# Patient Record
Sex: Female | Born: 1980 | Race: White | Hispanic: No | Marital: Married | Smoking: Current some day smoker
Health system: Southern US, Community
[De-identification: ages and names within clinical notes are randomized; demographics above are authoritative.]

## PROBLEM LIST (undated history)

## (undated) DIAGNOSIS — M1990 Unspecified osteoarthritis, unspecified site: Secondary | ICD-10-CM

## (undated) DIAGNOSIS — F419 Anxiety disorder, unspecified: Secondary | ICD-10-CM

## (undated) DIAGNOSIS — K8020 Calculus of gallbladder without cholecystitis without obstruction: Secondary | ICD-10-CM

## (undated) DIAGNOSIS — F329 Major depressive disorder, single episode, unspecified: Secondary | ICD-10-CM

## (undated) DIAGNOSIS — M797 Fibromyalgia: Secondary | ICD-10-CM

## (undated) DIAGNOSIS — Z8619 Personal history of other infectious and parasitic diseases: Secondary | ICD-10-CM

## (undated) DIAGNOSIS — O24419 Gestational diabetes mellitus in pregnancy, unspecified control: Secondary | ICD-10-CM

## (undated) DIAGNOSIS — T7840XA Allergy, unspecified, initial encounter: Secondary | ICD-10-CM

## (undated) DIAGNOSIS — F32A Depression, unspecified: Secondary | ICD-10-CM

## (undated) DIAGNOSIS — G43909 Migraine, unspecified, not intractable, without status migrainosus: Secondary | ICD-10-CM

## (undated) DIAGNOSIS — F909 Attention-deficit hyperactivity disorder, unspecified type: Secondary | ICD-10-CM

## (undated) DIAGNOSIS — K219 Gastro-esophageal reflux disease without esophagitis: Secondary | ICD-10-CM

## (undated) DIAGNOSIS — IMO0002 Reserved for concepts with insufficient information to code with codable children: Secondary | ICD-10-CM

## (undated) HISTORY — DX: Migraine, unspecified, not intractable, without status migrainosus: G43.909

## (undated) HISTORY — DX: Fibromyalgia: M79.7

## (undated) HISTORY — DX: Gastro-esophageal reflux disease without esophagitis: K21.9

## (undated) HISTORY — DX: Gestational diabetes mellitus in pregnancy, unspecified control: O24.419

## (undated) HISTORY — DX: Personal history of other infectious and parasitic diseases: Z86.19

## (undated) HISTORY — DX: Major depressive disorder, single episode, unspecified: F32.9

## (undated) HISTORY — PX: KNEE CARTILAGE SURGERY: SHX688

## (undated) HISTORY — DX: Calculus of gallbladder without cholecystitis without obstruction: K80.20

## (undated) HISTORY — DX: Depression, unspecified: F32.A

## (undated) HISTORY — DX: Unspecified osteoarthritis, unspecified site: M19.90

## (undated) HISTORY — DX: Reserved for concepts with insufficient information to code with codable children: IMO0002

## (undated) HISTORY — DX: Anxiety disorder, unspecified: F41.9

## (undated) HISTORY — DX: Allergy, unspecified, initial encounter: T78.40XA

---

## 2007-02-28 ENCOUNTER — Other Ambulatory Visit: Admission: RE | Admit: 2007-02-28 | Discharge: 2007-02-28 | Payer: Self-pay | Admitting: Obstetrics and Gynecology

## 2008-11-26 LAB — CONVERTED CEMR LAB: Pap Smear: NORMAL

## 2008-12-10 ENCOUNTER — Other Ambulatory Visit: Admission: RE | Admit: 2008-12-10 | Discharge: 2008-12-10 | Payer: Self-pay | Admitting: Obstetrics and Gynecology

## 2009-01-06 ENCOUNTER — Emergency Department (HOSPITAL_COMMUNITY): Admission: EM | Admit: 2009-01-06 | Discharge: 2009-01-06 | Payer: Self-pay | Admitting: Family Medicine

## 2009-05-10 ENCOUNTER — Emergency Department (HOSPITAL_COMMUNITY): Admission: EM | Admit: 2009-05-10 | Discharge: 2009-05-10 | Payer: Self-pay | Admitting: Family Medicine

## 2009-09-19 ENCOUNTER — Emergency Department (HOSPITAL_COMMUNITY): Admission: EM | Admit: 2009-09-19 | Discharge: 2009-09-19 | Payer: Self-pay | Admitting: Family Medicine

## 2009-11-05 ENCOUNTER — Ambulatory Visit: Payer: Self-pay | Admitting: Family

## 2009-11-05 DIAGNOSIS — F419 Anxiety disorder, unspecified: Secondary | ICD-10-CM

## 2009-11-05 DIAGNOSIS — F988 Other specified behavioral and emotional disorders with onset usually occurring in childhood and adolescence: Secondary | ICD-10-CM | POA: Insufficient documentation

## 2009-11-05 DIAGNOSIS — F329 Major depressive disorder, single episode, unspecified: Secondary | ICD-10-CM

## 2009-11-05 DIAGNOSIS — F32A Depression, unspecified: Secondary | ICD-10-CM | POA: Insufficient documentation

## 2009-11-05 DIAGNOSIS — F17200 Nicotine dependence, unspecified, uncomplicated: Secondary | ICD-10-CM | POA: Insufficient documentation

## 2009-11-05 LAB — CONVERTED CEMR LAB
Alkaline Phosphatase: 49 units/L (ref 39–117)
BUN: 9 mg/dL (ref 6–23)
Basophils Absolute: 0.1 10*3/uL (ref 0.0–0.1)
Bilirubin, Direct: 0 mg/dL (ref 0.0–0.3)
CO2: 28 meq/L (ref 19–32)
Chloride: 108 meq/L (ref 96–112)
Cholesterol: 230 mg/dL — ABNORMAL HIGH (ref 0–200)
Creatinine, Ser: 0.6 mg/dL (ref 0.4–1.2)
Direct LDL: 166.7 mg/dL
Eosinophils Absolute: 0 10*3/uL (ref 0.0–0.7)
HCT: 41.6 % (ref 36.0–46.0)
HDL: 57.5 mg/dL (ref >39.00)
MCHC: 33.4 g/dL (ref 30.0–36.0)
MCV: 91.1 fL (ref 78.0–100.0)
Monocytes Absolute: 0.6 10*3/uL (ref 0.1–1.0)
Neutrophils Relative %: 82.7 % — ABNORMAL HIGH (ref 43.0–77.0)
RBC: 4.57 M/uL (ref 3.87–5.11)
Total Bilirubin: 0.4 mg/dL (ref 0.3–1.2)
Total CHOL/HDL Ratio: 4
Triglycerides: 118 mg/dL (ref 0.0–149.0)
VLDL: 23.6 mg/dL (ref 0.0–40.0)

## 2009-11-08 ENCOUNTER — Encounter: Payer: Self-pay | Admitting: Family

## 2009-11-09 ENCOUNTER — Ambulatory Visit: Payer: Self-pay | Admitting: Family

## 2009-11-15 ENCOUNTER — Ambulatory Visit: Payer: Self-pay | Admitting: Family

## 2009-11-19 ENCOUNTER — Telehealth: Payer: Self-pay | Admitting: Family

## 2009-11-22 ENCOUNTER — Ambulatory Visit: Payer: Self-pay | Admitting: Family

## 2009-12-06 ENCOUNTER — Ambulatory Visit: Payer: Self-pay | Admitting: Diagnostic Radiology

## 2009-12-06 ENCOUNTER — Ambulatory Visit: Payer: Self-pay | Admitting: Family

## 2009-12-06 ENCOUNTER — Ambulatory Visit (HOSPITAL_BASED_OUTPATIENT_CLINIC_OR_DEPARTMENT_OTHER): Admission: RE | Admit: 2009-12-06 | Discharge: 2009-12-06 | Payer: Self-pay | Admitting: Internal Medicine

## 2009-12-06 DIAGNOSIS — M542 Cervicalgia: Secondary | ICD-10-CM | POA: Insufficient documentation

## 2010-01-17 ENCOUNTER — Ambulatory Visit: Payer: Self-pay | Admitting: Family

## 2010-01-17 ENCOUNTER — Telehealth: Payer: Self-pay | Admitting: Family

## 2010-01-17 DIAGNOSIS — J329 Chronic sinusitis, unspecified: Secondary | ICD-10-CM | POA: Insufficient documentation

## 2010-01-17 DIAGNOSIS — J40 Bronchitis, not specified as acute or chronic: Secondary | ICD-10-CM | POA: Insufficient documentation

## 2010-09-28 NOTE — Assessment & Plan Note (Signed)
Summary: HIVES / TF,CMA   Vital Signs:  Patient profile:   30 year old female Weight:      190 pounds Temp:     97.5 degrees F oral Pulse rate:   80 / minute Pulse rhythm:   regular Resp:     16 per minute BP sitting:   110 / 70  (right arm) Cuff size:   regular  Vitals Entered By: Mervin Kung CMA (November 09, 2009 1:36 PM) CC: room 5  Itching on Sunday. Monday afternoon developed painful, itchy rash.   CC:  room 5  Itching on Sunday. Monday afternoon developed painful and itchy rash..  History of Present Illness: [name] [name] is a 30 year old female who presents today in follow up of her rash/"hives".  Notes itching rash.  Also noted some tongue/lip swelling last night around 6:30PM- feels like this is a continuation of rash for which she came to see me on 3/11, does not feel that this is a result of amoxicillin.  She has taking benadryl without improvement in her rash, but does note resolution of tongue and lip swelling.  Has also been applying hydrocortisone cream without relief.  Denies SOB, or wheezing.    Allergies (verified): 1)  ! Penicillin  Physical Exam  General:  Well-developed,well-nourished,in no acute distress; alert,appropriate and cooperative throughout examination Mouth:  Oral mucosa and oropharynx without lesions or exudates.  Teeth in good repair. No tongue or lip swelling Lungs:  Normal respiratory effort, chest expands symmetrically. Lungs are clear to auscultation, no crackles or wheezes. Heart:  Normal rate and regular rhythm. S1 and S2 normal without gallop, murmur, click, rub or other extra sounds. Abdomen:  raised red urticarial rash noted on back,  and arms Extremities:  fingers slightly swollen Skin:  raised urticarial rash noted on arms/back   Impression & Recommendations:  Problem # 1:  ALLERGIC REACTION (ICD-995.3) Assessment Deteriorated  Will treat with solumedrol IM x 1, followed by prednisone, will also continue benadryl and add pepcid.   Plan f/u in 1 week.  Pt advised to follow up per instructions below.  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 Plan for patient to follow up with her allergist for further testing.    Orders: Admin of Therapeutic Inj  intramuscular or subcutaneous ([address]) Solumedrol up to 125mg  (U0454)  Problem # 2:  SINUSITIS (ICD-473.9) Assessment: Comment Only I doubt that amoxicillin is playing a role,  but will switch to zithromax to be cautious Her updated medication list for this problem includes:    Zithromax Z-pak 250 Mg Tabs (Azithromycin) .Marland Kitchen... 2 tabs by mouth today, then one tablet by mouth daily x 4 more days  Complete Medication List: 1)  Effexor Xr 75 Mg Xr24h-cap (Venlafaxine hcl) .... Take 1 tablet by mouth once a day 2)  Adderall 20 Mg Tabs (Amphetamine-dextroamphetamine) .... Take 1 tablet by mouth three times a day 3)  Zyrtec Allergy 10 Mg Caps (Cetirizine hcl) .... Take 1 tablet by mouth once a day as needed. 4)  Xanax 0.5 Mg Tabs (Alprazolam) .... Take one as needed daily. 5)  Tylenol Extra Strength 500 Mg Tabs (Acetaminophen) .... As needed 6)  Excedrin Migraine 250-250-65 Mg Tabs (Aspirin-acetaminophen-caffeine) .... As needed 7)  Zithromax Z-pak 250 Mg Tabs (Azithromycin) .... 2 tabs by mouth today, then one tablet by mouth daily x 4 more days 8)  Nystatin 100000 Unit/ml Susp (Nystatin) .... One teaspoon by mouth 4x daily  for one week or until symptoms resolved 9)  Prednisone 10 Mg Tabs (Prednisone) .... Take as directed  Patient Instructions: 1)  Prednisone: take 4 tablets daily x 2 days, then 3 tabs daily x 2 days, then 2 tabs daily x 2 days, then 1 tab daily for 2 days then stop. 2)  Pepcid 20mg  by mouth two times a day  3)  Benadryl 25mg  by mouth every 6 hours. 4)  Follow up with your allergist for further evaluation of your allergies 5)  Go to ER if shortness of breath, or recurrent tongue/lip swelling.   6)  Follow  up in 1 week.   Prescriptions: PREDNISONE 10 MG TABS (PREDNISONE) take as directed  #20 x 0   Entered and Authorized by:   Lemont Fillers FNP   Signed by:   Lemont Fillers FNP on 11/09/2009   Method used:   Electronically to        [facility] (retail)       [address]       [place], [place]  [address]       Ph: [phone] or [phone]       Fax: [phone]   RxID:   [id number] ZITHROMAX Z-PAK 250 MG TABS (AZITHROMYCIN) 2 tabs by mouth today, then one tablet by mouth daily x 4 more days  #1 pack x 0   Entered and Authorized by:   Lemont Fillers FNP   Signed by:   Lemont Fillers FNP on 11/09/2009   Method used:   Electronically to        [facility] (retail)       [address]       [place], [place]  [address]       Ph: [phone] or [phone]       Fax: [phone]   RxID:   [id number]   Current Allergies (reviewed today): ! PENICILLIN   Medication Administration  Injection # 1:    Medication: Solumedrol up to 125mg     Diagnosis: ALLERGIC REACTION (ICD-995.3)    Route: IM    Site: LUOQ gluteus    Exp Date: 10/27/2010    Lot #: [id number]    Mfr: [employer]    Patient tolerated injection without complications    Given by: Mervin Kung CMA (November 09, 2009 4:11 PM)  Orders Added: 1)  Admin of Therapeutic Inj  intramuscular or subcutaneous [96[address]] 2)  Solumedrol up to 125mg  [J2930] 3)  Est. Patient Level III [[address]]

## 2010-09-28 NOTE — Assessment & Plan Note (Signed)
Summary: 1 week follow up/mhf   Vital Signs:  Patient profile:   30 year old female Weight:      186 pounds BMI:     31.55 Temp:     98.0 degrees F 0 Pulse rate:   92 / minute Pulse rhythm:   regular Resp:     16 per minute BP sitting:   120 / 70  (right arm) Cuff size:   regular  Vitals Entered By: Mervin Kung CMA (November 15, 2009 2:12 PM) CC: room 4   1 week f/u   CC:  room 4   1 week f/u.  History of Present Illness: Ms Reznik is a 30 year old female who presents today for follow up of her hives.  Notes that her hives resolved by 8PM the day she received the solumedrol.  Has appointment with allergist 3/31- She notes that she completed 4 days of prednisone and then stopped due to "wanting to jump out of my skin."  Denies wheezing, denies SOB.  Notes + pain at the base of her neck, hurts to turn head side to side, not relieved by mobic.    Allergies: 1)  ! Penicillin  Physical Exam  General:  Well-developed,well-nourished,in no acute distress; alert,appropriate and cooperative throughout examination Head:  Normocephalic and atraumatic without obvious abnormalities. No apparent alopecia or balding. Neck:  + pain at base of head with lateral movement of head left and right.  + tenderness to palpation at the base of the neck.   Skin:  skin is pink, no rashes or hives noted. Psych:  Cognition and judgment appear intact. Alert and cooperative with normal attention span and concentration. No apparent delusions, illusions, hallucinations   Impression & Recommendations:  Problem # 1:  ALLERGIC REACTION (ICD-995.3) Assessment Improved symptoms resolved, plan follow up with allergist.    Problem # 2:  NECK PAIN (ICD-723.1) Assessment: New Continue mobic, will add as needed flexeril.  I supsect that pain is musculoskeletal Her updated medication list for this problem includes:    Tylenol Extra Strength 500 Mg Tabs (Acetaminophen) .Marland Kitchen... As needed    Excedrin Migraine  250-250-65 Mg Tabs (Aspirin-acetaminophen-caffeine) .Marland Kitchen... As needed    Flexeril 5 Mg Tabs (Cyclobenzaprine hcl) ..... One tab by mouth every 8 hours as needed for neck pain  Complete Medication List: 1)  Effexor Xr 75 Mg Xr24h-cap (Venlafaxine hcl) .... Take 1 tablet by mouth once a day 2)  Adderall 20 Mg Tabs (Amphetamine-dextroamphetamine) .... Take 1 tablet by mouth three times a day 3)  Zyrtec Allergy 10 Mg Caps (Cetirizine hcl) .... Take 1 tablet by mouth once a day as needed. 4)  Xanax 0.5 Mg Tabs (Alprazolam) .... Take one as needed daily. 5)  Tylenol Extra Strength 500 Mg Tabs (Acetaminophen) .... As needed 6)  Excedrin Migraine 250-250-65 Mg Tabs (Aspirin-acetaminophen-caffeine) .... As needed 7)  Flexeril 5 Mg Tabs (Cyclobenzaprine hcl) .... One tab by mouth every 8 hours as needed for neck pain  Patient Instructions: 1)  Call if recurrent itching, if severe allergic rxn go to ER (shortness of breath, wheezing, tongue or lip swelling) 2)  Follow up with your allergist. 3)  Call if your neck pain worsens or does not improve with use of mobic and flexeril. Prescriptions: FLEXERIL 5 MG TABS (CYCLOBENZAPRINE HCL) one tab by mouth every 8 hours as needed for neck pain  #20 x 0   Entered and Authorized by:   Lemont Fillers FNP   Signed by:  Lemont Fillers FNP on 11/15/2009   Method used:   Electronically to        [facility] (retail)       [address]       [place], [place]  [address]       Ph: [phone] or [phone]       Fax: [phone]   RxID:   [id number]   Current Allergies (reviewed today): ! PENICILLIN

## 2010-09-28 NOTE — Assessment & Plan Note (Signed)
Summary: COUGH , CHEST CONGESTION/HEA--room 5   Vital Signs:  Patient profile:   30 year old female Height:      64.5 inches Weight:      190.75 pounds BMI:     32.35 O2 Sat:      98 % on Room air Temp:     98.2 degrees F oral Pulse rate:   87 / minute Pulse rhythm:   regular Resp:     16 per minute BP sitting:   120 / 80  (right arm) Cuff size:   regular  Vitals Entered By: Mervin Kung CMA (Jan 17, 2010 10:17 AM)  O2 Flow:  Room air CC: room 5  Cough, congestion x 2 days.   CC:  room 5  Cough and congestion x 2 days.Marland Kitchen  History of Present Illness: [name] [name] is a 30 year old female who presents with complaint of ear congesion, nasal congestion.  Had low grade temp last night (99.8).  + cough, productive (phlegm tastes bad).  + body aches.  Symptoms started saturday night.  Tried tylenol without improvement. Notes that her sister has been sick with 102 fever.  Patient recently was treated with doxy and clindamycin following a cellulitis on her left middle finger.  Saw wound specialist.    Allergies: 1)  ! Penicillin  Physical Exam  General:  Well-developed,well-nourished,in no acute distress; alert,appropriate and cooperative throughout examination Head:  Normocephalic and atraumatic without obvious abnormalities. No apparent alopecia or balding. + maxillary tenderness to palpation. Eyes:  PERRLA Ears:  Bilateral TM's are dull Mouth:  Oral mucosa and oropharynx without lesions or exudates.  Teeth in good repair. Neck:  mild cervical LAD Lungs:  Normal respiratory effort, chest expands symmetrically. Lungs are clear to auscultation bilaterally with exception of some upper airway rhonchi Heart:  Normal rate and regular rhythm. S1 and S2 normal without gallop, murmur, click, rub or other extra sounds. Skin:  left middle finger cellulitis is resolved.   Impression & Recommendations:  Problem # 1:  SINUSITIS (ICD-473.9) Assessment New Patient has allergy to penicillin  (lip swelling/hives) Rx given for levaquin (pt felt too expensive).  Switched to zithromax. Her updated medication list for this problem includes:    Zithromax Z-pak 250 Mg Tabs (Azithromycin) .Marland Kitchen... 2 tabs by mouth today, then one tablet by mouth daily for 4 additional days    Tussionex Pennkinetic Er 8-10 Mg/38ml Lqcr (Chlorpheniramine-hydrocodone) .Marland KitchenMarland KitchenMarland KitchenMarland Kitchen 5 ml by mouth every 12 hours as needed for severe cough  Problem # 2:  BRONCHITIS (ICD-490) Assessment: New Treat with zithromax and tussionex as needed. Her updated medication list for this problem includes:    Zithromax Z-pak 250 Mg Tabs (Azithromycin) .Marland Kitchen... 2 tabs by mouth today, then one tablet by mouth daily for 4 additional days    Tussionex Pennkinetic Er 8-10 Mg/70ml Lqcr (Chlorpheniramine-hydrocodone) .Marland KitchenMarland KitchenMarland KitchenMarland Kitchen 5 ml by mouth every 12 hours as needed for severe cough  Complete Medication List: 1)  Effexor Xr 75 Mg Xr24h-cap (Venlafaxine hcl) .... Take 1 tablet by mouth once a day 2)  Adderall 20 Mg Tabs (Amphetamine-dextroamphetamine) .... Take 1 tablet by mouth three times a day 3)  Xanax 0.5 Mg Tabs (Alprazolam) .... Take one as needed daily. 4)  Allegra 180 Mg Tabs (Fexofenadine hcl) .... Take 1 tablet by mouth once a day 5)  Imitrex 50 Mg Tabs (Sumatriptan succinate) .... One tablet by mouth as needed for migraine, may repeat in 2 hours x 1 as needed 6)  Aleve 220 Mg  Tabs (Naproxen sodium) .... As needed for headache 7)  Zithromax Z-pak 250 Mg Tabs (Azithromycin) .... 2 tabs by mouth today, then one tablet by mouth daily for 4 additional days 8)  Tussionex Pennkinetic Er 8-10 Mg/75ml Lqcr (Chlorpheniramine-hydrocodone) .... 5 ml by mouth every 12 hours as needed for severe cough  Patient Instructions: 1)  Call if you develop fever over 101, increasing sinus pressure, pain with eye movement, increased facial tenderness of swelling, or if you develop visual changes. 2)  Call if symptoms worsen or do not improve. Prescriptions: TUSSIONEX  PENNKINETIC ER 8-10 MG/5ML LQCR (CHLORPHENIRAMINE-HYDROCODONE) 5 ml by mouth every 12 hours as needed for severe cough  #150 x 0   Entered and Authorized by:   Lemont Fillers FNP   Signed by:   Lemont Fillers FNP on 01/17/2010   Method used:   Print then Give to Patient   RxID:   [id number] LEVAQUIN 500 MG TABS (LEVOFLOXACIN) one tablet by mouth daily x 7 days  #7 x 0   Entered and Authorized by:   Lemont Fillers FNP   Signed by:   Lemont Fillers FNP on 01/17/2010   Method used:   Electronically to        [facility] (retail)       [address]       [place], [place]  [address]       Ph: [phone] or [phone]       Fax: [phone]   RxID:   [id number]   Current Allergies (reviewed today): ! PENICILLIN

## 2010-09-28 NOTE — Assessment & Plan Note (Signed)
Summary: ONE MTH FU/KDC rsc with pt/mhf   Vital Signs:  Patient profile:   30 year old female Height:      64.5 inches Weight:      188 pounds BMI:     31.89 Temp:     97.9 degrees F oral Pulse rate:   84 / minute Pulse rhythm:   regular Resp:     16 per minute BP sitting:   120 / 82  (right arm) Cuff size:   regular  Vitals Entered By: Mervin Kung CMA (December 06, 2009 9:34 AM) CC: room 5  1 month follow up.  Still having headaches.   CC:  room 5  1 month follow up.  Still having headaches..  History of Present Illness: [name] [name] is a 30 year old female who presents today for follow up of her blood pressure and headache/neck pain.  She has been using Crista Elliot and [place] with some improvement.  Also notes that pain relieves when she presses her hand against the upper portion of her neck.  Pain is sharp in nature.  Has had some numbness in the right hand/arm which is intermittent in nature.  Overall her symptoms are improved.    Allergies: 1)  ! Penicillin  Physical Exam  General:  Well-developed,well-nourished,in no acute distress; alert,appropriate and cooperative throughout examination Head:  Normocephalic and atraumatic without obvious abnormalities. No apparent alopecia or balding. Neck:  + point tenderness noted on exam at top of Cspine.  Lungs:  Normal respiratory effort, chest expands symmetrically. Lungs are clear to auscultation, no crackles or wheezes. Heart:  Normal rate and regular rhythm. S1 and S2 normal without gallop, murmur, click, rub or other extra sounds. Neurologic:  upper extremity DTRs symmetrical and normal.  Bilateral upper extremity strength 5/5   Impression & Recommendations:  Problem # 1:  NECK PAIN, RIGHT (ICD-723.1) Assessment New Per history patient's main problem at this time is neck pain rather than headache.  She is experiencing some relief with naproxen.  Will refill flexeril to be used at bedtime as needed and check C-spine x-ray (urine  HCG negative) Can consider referral to orthopedics if no improvement.  Patient declines referral for PT at this time.   Overall she feels that her discomfort is improving.  The following medications were removed from the medication list:    Tylenol Extra Strength 500 Mg Tabs (Acetaminophen) .Marland Kitchen... As needed    Excedrin Migraine 250-250-65 Mg Tabs (Aspirin-acetaminophen-caffeine) .Marland Kitchen... As needed    Flexeril 5 Mg Tabs (Cyclobenzaprine hcl) ..... One tab by mouth every 8 hours as needed for neck pain Her updated medication list for this problem includes:    Aleve 220 Mg Tabs (Naproxen sodium) .Marland Kitchen... As needed for headache    Flexeril 5 Mg Tabs (Cyclobenzaprine hcl) ..... One tablet every 8 hours as needed for neck pain  Orders: T-Cervical Spine Comp w/Flex & Ext ([id number])  Problem # 2:  ELEVATED BLOOD PRESSURE WITHOUT DIAGNOSIS OF HYPERTENSION (ICD-796.2) Assessment: Improved F/u BP is normal.  Monitor.    Complete Medication List: 1)  Effexor Xr 75 Mg Xr24h-cap (Venlafaxine hcl) .... Take 1 tablet by mouth once a day 2)  Adderall 20 Mg Tabs (Amphetamine-dextroamphetamine) .... Take 1 tablet by mouth three times a day 3)  Xanax 0.5 Mg Tabs (Alprazolam) .... Take one as needed daily. 4)  Allegra 180 Mg Tabs (Fexofenadine hcl) .... Take 1 tablet by mouth once a day 5)  Imitrex 50 Mg Tabs (Sumatriptan succinate) .Marland KitchenMarland KitchenMarland Kitchen  One tablet by mouth as needed for migraine, may repeat in 2 hours x 1 as needed 6)  Aleve 220 Mg Tabs (Naproxen sodium) .... As needed for headache 7)  Flexeril 5 Mg Tabs (Cyclobenzaprine hcl) .... One tablet every 8 hours as needed for neck pain  Patient Instructions: 1)  Please complete your x-ray today. 2)  Follow up in 1 month. Prescriptions: FLEXERIL 5 MG TABS (CYCLOBENZAPRINE HCL) one tablet every 8 hours as needed for neck pain  #30 x 0   Entered and Authorized by:   Lemont Fillers FNP   Signed by:   Lemont Fillers FNP on 12/06/2009   Method used:    Electronically to        [facility] (retail)       [address]       [place], [place]  [address]       Ph: [phone] or [phone]       Fax: [phone]   RxID:   [id number]   Current Allergies (reviewed today): ! PENICILLIN  Appended Document: Orders Update    Clinical Lists Changes  Orders: Added new Service order of Urine Pregnancy Test  [phone]) - Signed Observations: Added new observation of PREG TST URN: negative (12/06/2009 16:59)      Laboratory Results   Urine Tests      Urine HCG: negative

## 2010-09-28 NOTE — Progress Notes (Signed)
Summary: generic abx alternative?  Phone Note Call from Patient Call back at [phone]   Caller: Patient Call For: Lemont Fillers FNP Summary of Call: Pt states that Levaquin is too expensive. Wants to know if there is a generic abx. that we could call in other than penicillin?  Mervin Kung CMA  Jan 17, 2010 1:12 PM   Follow-up for Phone Call        She can use Z-pak.  I sent it to her pharmacy. Pls notify patient. Follow-up by: Lemont Fillers FNP,  Jan 17, 2010 1:19 PM  Additional Follow-up for Phone Call Additional follow up Details #1::        Pt was notified of change in abx.  Mervin Kung CMA  Jan 17, 2010 1:57 PM     New/Updated Medications: ZITHROMAX Z-PAK 250 MG TABS (AZITHROMYCIN) 2 tabs by mouth today, then one tablet by mouth daily for 4 additional days Prescriptions: ZITHROMAX Z-PAK 250 MG TABS (AZITHROMYCIN) 2 tabs by mouth today, then one tablet by mouth daily for 4 additional days  #1 x 0   Entered and Authorized by:   Lemont Fillers FNP   Signed by:   Lemont Fillers FNP on 01/17/2010   Method used:   Electronically to        [facility] (retail)       [address]       [place], [place]  [address]       Ph: [phone] or [phone]       Fax: [phone]   RxID:   [id number]

## 2010-09-28 NOTE — Progress Notes (Signed)
Summary: pt. wants a call back   Phone Note Call from Patient   Caller: Patient Summary of Call: pt. needs a call back re: a question she has for [facility], call [phone] Initial call taken by: Michaelle Copas,  November 19, 2009 12:27 PM  Follow-up for Phone Call        Pt. states the pain in the back of her head is increasing. It is a sharp, shooting pain.  The tylenol and mobic doesn't seem to be helping.  Mobic eased the pain but didn't stop it. She is out of Mobic now.  States she can see a sporadic floater. Flexeril puts pt. to sleep.   Please advise.  Mervin Kung CMA  November 19, 2009 3:12 PM  Follow-up by: Lemont Fillers FNP,  November 19, 2009 3:46 PM  Additional Follow-up for Phone Call Additional follow up Details #1::        Patient notes + neck pain at base of her neck.  Has tried stretching and [employer], massage, heat.  Pain is sharp, worse with certain movements.  Pt denies photophobia or fever.   Notes some relief with 2 mobic.  Plan as needed tramadol.  If no improvement over weekend, I instructed pt to call for apt on Monday.  If fever, neck stiffness, photophobia- I advised pt to go to ED.  She verbalizes understanding. Additional Follow-up by: Lemont Fillers FNP,  November 19, 2009 3:53 PM    New/Updated Medications: TRAMADOL HCL 50 MG TABS (TRAMADOL HCL) one tablet every 6 hours as needed for severe pain Prescriptions: TRAMADOL HCL 50 MG TABS (TRAMADOL HCL) one tablet every 6 hours as needed for severe pain  #30 x 0   Entered and Authorized by:   Lemont Fillers FNP   Signed by:   Lemont Fillers FNP on 11/19/2009   Method used:   Electronically to        [facility] (retail)       [address]       [place], [place]  [address]       Ph: [phone] or [phone]       Fax: [phone]   RxID:   [id number]

## 2010-09-28 NOTE — Letter (Signed)
   [facility] nurse [facility] [address] [place], [place] [address]    November 08, 2009   [name] [name] [address] [place], [place] [address]  RE:  LAB RESULTS  Dear  Ms. Miguez,  The following is an interpretation of your most recent lab tests.  Please take note of any instructions provided or changes to medications that have resulted from your lab work.  ELECTROLYTES:  Good - no changes needed  KIDNEY FUNCTION TESTS:  Good - no changes needed  LIPID PANEL:  Fair - review at your next visit Triglyceride: 118.0   Cholesterol: 230   HDL: 57.50   Chol/HDL%:  4  Please work on a low fat low cholesterol diet.  Your cholesterol is elevated.  We can discuss further at your upcoming appointment in April.   Sincerely Yours,    Lemont Fillers FNP

## 2010-09-28 NOTE — Assessment & Plan Note (Signed)
Summary: new to be est/mhf   Vital Signs:  Patient profile:   30 year old female Height:      64.5 inches Weight:      183.50 pounds BMI:     31.12 Temp:     97.2 degrees F oral Pulse rate:   92 / minute Pulse rhythm:   regular Resp:     18 per minute BP sitting:   140 / 92  (right arm) Cuff size:   regular  Vitals Entered By: Mervin Kung CMA (November 05, 2009 8:40 AM) Is Patient Diabetic? No Comments Would also like to be checked for sinus infection. She states that she seems to keep a sinus infection.   History of Present Illness: Ms Henson is a 30 yr old female who presents to establish care.  Has not had a primary in years.  Has followed with GYN only.  Patient is fasting.  Tells me that she had breakout of hives- on Monday- was seen at [facility].  Was prescribed prednisone notes that her hives have resolved.  Had low grade fever yesterday. Patient has been followed by an allergist- last visit 2 years ago.    Preventative- Patient sees Ria Comment for GYN.  Last tetanus was greater than 10 yrs ago.  Notes that she generally works out regularly.  Notes that she has not been eating healthy- eats out frequently drinks two 2 ounce cokes daily.    ADD-  sees Dr Milagros Evener  sees regularly, notes that her ADD is well controlled.    Tobacco abuse-  has quit previously quit on chantix- not ready to quit.    Migraines- last "full blown" migraine was greater than 6 months ago, but she does get sinus headaches.    Preventive Screening-Counseling & Management  Alcohol-Tobacco     Alcohol drinks/day: <1     Alcohol type: wine & beer     Smoking Status: current     Packs/Day: <0.25  Caffeine-Diet-Exercise     Caffeine use/day: 2-- 20 0z bottles     Does Patient Exercise: no  Allergies (verified): No Known Drug Allergies  Past History:  Past Medical History: Migraines Hay Fever/Allergies Frequent Headaches  Past Surgical History: Left Knee  Surgery--1999  Family History: Prostate Cancer--Paternal grandfather Hypercholesterolemia--mother Heart Disease--maternal grandparents HTN--mother Diabetes--paternal grandmother  Mom- HTN, hyperlipidemia, hysterectomy,  migraines, asthma Dad- healthy, is a Education officer, community.  Sister-  chronic migraines, ADD No children  Social History: Married Current Smoker (5-6 cigarettes a day)  Alcohol use-yes 1-2 beers on weekends Regular exercise-no Trying to apply to a nursing program. 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  She gets this med from GYN Neuro: Denies numbness     Physical Exam  General:  overweight white female in NAD Head:  Normocephalic and atraumatic without obvious abnormalities. No apparent alopecia or balding. Eyes:  PERRLA Ears:  External ear exam shows no significant lesions or deformities.  Otoscopic examination reveals clear  canals, tympanic membranes are intact bilaterally without bulging, retraction, inflammation or discharge. Hearing is grossly normal bilaterally. Mouth:  + thrush noted on tongue Neck:  No deformities, masses, or tenderness noted. Breasts:  declined- done by GYN Lungs:  Normal respiratory effort, chest expands symmetrically. Lungs are clear to auscultation, no crackles or wheezes. Heart:  Normal rate and regular rhythm. S1 and S2 normal without gallop, murmur, click, rub or other extra sounds. Abdomen:  Bowel  sounds positive,abdomen soft and non-tender without masses, organomegaly or hernias noted. Genitalia:  deferred to GYN Msk:  No deformity or scoliosis noted of thoracic or lumbar spine.   Extremities:  No clubbing, cyanosis, edema, or deformity noted with normal full range of motion of all joints.   Neurologic:  No cranial nerve deficits noted. Station and gait are normal. Plantar reflexes are down-going bilaterally. DTRs are symmetrical throughout. Sensory, motor and coordinative functions appear intact. Skin:  ruddy complexion, but no hives or lesions.   Cervical Nodes:  No lymphadenopathy noted Psych:  Cognition and judgment appear intact. Alert and cooperative with normal attention span and concentration. No apparent delusions, illusions, hallucinations   Impression & Recommendations:  Problem # 1:  Preventive Health Care (ICD-V70.0) Assessment Comment Only Patient counselled on diet, exercise, weight loss, pap up to date,  Plan for Td today.  Check fasting labs. Orders: Venipuncture (96295) TLB-BMP (Basic Metabolic Panel-BMET) (80048-METABOL) TLB-CBC Platelet - w/Differential (85025-CBCD) TLB-Hepatic/Liver Function Pnl (80076-HEPATIC) TLB-TSH (Thyroid Stimulating Hormone) (84443-TSH) TLB-Lipid Panel (80061-LIPID)  Problem # 2:  SINUSITIS (ICD-473.9) Assessment: New Pt with chronic nasal congestion, now with yellow/bloody discharge and sinus pressure.  Will treat with amoxicillin.  Pt instructed to follow up with allergist. (especially given recent episode of hives)   Her updated medication list for this problem includes:    Amoxicillin 500 Mg Cap (Amoxicillin) .Marland Kitchen... Take 1 capsule by mouth three times a day x 10 days  Problem # 3:  CANDIDIASIS, ORAL (ICD-112.0) Assessment: New Will check fasting sugar- need to r/o DM.  Will treat with nystatin  Problem # 4:  ELEVATED BLOOD PRESSURE WITHOUT DIAGNOSIS OF HYPERTENSION (ICD-796.2) F/u BP 130/90, patient denies history of htn, plan  f/u in 1 month.  Problem # 5:  ATTENTION DEFICIT DISORDER (ICD-314.00) Assessment: New Treated by psychiatry, patient tells me that this is stable on adderral.    Problem # 6:  ANXIETY DEPRESSION (ICD-300.4) Assessment: Improved Pt notes that this is stable.  Her GYN has been prescribing effexor.  I recommended that she follow up with psychiatry for further evaluation- she may be interested in discontinuing this medication.  Problem # 7:  TOBACCO ABUSE (ICD-305.1) Assessment: Unchanged patient counseled on need to quit- tells me she is not yet ready but will let me know.   Orders: Tobacco use cessation intermediate 3-10 minutes (99406)  Complete Medication List: 1)  Effexor Xr 75 Mg Xr24h-cap (Venlafaxine hcl) .... Take 1 tablet by mouth once a day 2)  Adderall 20 Mg Tabs (Amphetamine-dextroamphetamine) .... Take 1 tablet by mouth three times a day 3)  Zyrtec Allergy 10 Mg Caps (Cetirizine hcl) .... Take 1 tablet by mouth once a day as needed. 4)  Xanax 0.5 Mg Tabs (Alprazolam) .... Take one as needed daily. 5)  Tylenol Extra Strength 500 Mg Tabs (Acetaminophen) .... As needed 6)  Excedrin Migraine 250-250-65 Mg Tabs (Aspirin-acetaminophen-caffeine) .... As needed 7)  Prednisone (pak) 10 Mg Tabs (Prednisone) .... As directed-- 6 day 8)  Amoxicillin 500 Mg Cap (Amoxicillin) .Marland KitchenMarland KitchenMarland Kitchen  Take 1 capsule by mouth three times a day x 10 days 9)  Nystatin 100000 Unit/ml Susp (Nystatin) .... One teaspoon by mouth 4x daily for one week or until symptoms resolved  Other Orders: Tdap => 25yrs IM (04540) Admin 1st Vaccine (98119)  Patient Instructions: 1)  Please work hard on diet, weight loss and healthy eating. 2)  It is important that you exercise regularly at least 20 minutes 5 times a week. If you develop chest pain, have severe difficulty breathing, or feel very tired , stop exercising immediately and seek medical attention. 3)  Call if you develop fever over 101, increasing sinus pressure, pain  with eye movement, increased facial tenderness of swelling, or if you develop visual changes. 4)  Please schedule a follow-up appointment in 1 month. Prescriptions: NYSTATIN 100000 UNIT/ML SUSP (NYSTATIN) one teaspoon by mouth 4x daily for one week or until symptoms resolved  #1 bottle x 0   Entered and Authorized by:   Lemont Fillers FNP   Signed by:   Lemont Fillers FNP on 11/05/2009   Method used:   Electronically to        [facility] (retail)       [address]       [place], [place]  [address]       Ph: [phone] or [phone]       Fax: [phone]   RxID:   [id number] AMOXICILLIN 500 MG CAP (AMOXICILLIN) Take 1 capsule by mouth three times a day X 10 days  #30 x 0   Entered and Authorized by:   Lemont Fillers FNP   Signed by:   Lemont Fillers FNP on 11/05/2009   Method used:   Electronically to        [facility] (retail)       [address]       [place], [place]  [address]       Ph: [phone] or [phone]       Fax: [phone]   RxID:   [id number]   Current Allergies (reviewed today): No known allergies    Preventive Care Screening  Pap Smear:    Date:  11/26/2008    Results:  normal     Immunization History:  Influenza Immunization History:    Influenza:  historical (06/28/2009)  Immunizations Administered:  Tetanus Vaccine:    Vaccine Type: Tdap    Site: left deltoid    Mfr: GlaxoSmithKline    Dose: 0.5 ml    Route: IM    Given by: Mervin Kung CMA    Exp. Date: 10/23/2011    Lot #: [id number]    VIS given: 07/16/07 version given November 05, 2009.    Vital Signs:  Patient Profile:   30 year old female Height:     64.5 inches Weight:      183.50 pounds BMI:     31.12 Temp:     97.2 degrees F oral Pulse rate:   92 / minute Pulse rhythm:   regular Resp:     18 per minute BP sitting:   140 / 92 Cuff size:   regular

## 2010-09-28 NOTE — Letter (Signed)
Summary: Out of Work  Adult nurse at [employer]. [address]   [place], [place] [address]   Phone: [phone]  Fax: [phone]    Jan 17, 2010   Employee:  [name] [name]    To Whom It May Concern:   For Medical reasons, please excuse the above named employee from work for the following dates:  Start:   5/23  End:   5/25  If you need additional information, please feel free to contact our office.         Sincerely,    Lemont Fillers FNP

## 2010-09-28 NOTE — Assessment & Plan Note (Signed)
Summary: headache/mhf   Vital Signs:  Patient profile:   30 year old female Height:      64.5 inches Weight:      194 pounds BMI:     32.90 Temp:     97.9 degrees F oral Pulse rate:   92 / minute Pulse rhythm:   regular Resp:     18 per minute BP sitting:   118 / 78  (right arm) Cuff size:   regular  Vitals Entered By: Mervin Kung CMA (November 22, 2009 3:42 PM) CC: room 6  Headache not improving. Now has shooting pain up head and into right eye. Is now seeing spots in her vision., Headache   CC:  room 6  Headache not improving. Now has shooting pain up head and into right eye. Is now seeing spots in her vision. and Headache.  History of Present Illness: [name] [name] is a 30 year old female who presents today with continuing complaints of headache.  Notes that this is different from the tension headaches that she has had in the past.  Pain shoots from the base of her neck to the back of her eye.  Has tried flexeril, aleve, and tylenol without improvement.  Notes + photophobia today.   Allergies: 1)  ! Penicillin  Physical Exam  General:  Well-developed,well-nourished,in no acute distress; alert,appropriate and cooperative throughout examination Neck:  No deformities, masses, some point tenderness at the right base of the skull/upper cervical region.  Full ROM of neck.   Psych:  Cognition and judgment appear intact. Alert and cooperative with normal attention span and concentration. No apparent delusions, illusions, hallucinations   Impression & Recommendations:  Problem # 1:  COMMON MIGRAINE (ICD-346.10) Assessment New Patient's symptoms have not responded to tylenol NSAIDS or flexeril.  Will give trial of imitrex for migraine.  If no improvement, will consider referral to [facility] center.  The following medications were removed from the medication list:    Tramadol Hcl 50 Mg Tabs (Tramadol hcl) ..... One tablet every 6 hours as needed for severe pain Her updated medication list  for this problem includes:    Tylenol Extra Strength 500 Mg Tabs (Acetaminophen) .Marland Kitchen... As needed    Excedrin Migraine 250-250-65 Mg Tabs (Aspirin-acetaminophen-caffeine) .Marland Kitchen... As needed    Imitrex 50 Mg Tabs (Sumatriptan succinate) ..... One tablet by mouth as needed for migraine, may repeat in 2 hours x 1 as needed  Complete Medication List: 1)  Effexor Xr 75 Mg Xr24h-cap (Venlafaxine hcl) .... Take 1 tablet by mouth once a day 2)  Adderall 20 Mg Tabs (Amphetamine-dextroamphetamine) .... Take 1 tablet by mouth three times a day 3)  Xanax 0.5 Mg Tabs (Alprazolam) .... Take one as needed daily. 4)  Tylenol Extra Strength 500 Mg Tabs (Acetaminophen) .... As needed 5)  Excedrin Migraine 250-250-65 Mg Tabs (Aspirin-acetaminophen-caffeine) .... As needed 6)  Flexeril 5 Mg Tabs (Cyclobenzaprine hcl) .... One tab by mouth every 8 hours as needed for neck pain 7)  Allegra 180 Mg Tabs (Fexofenadine hcl) .... Take 1 tablet by mouth once a day 8)  Imitrex 50 Mg Tabs (Sumatriptan succinate) .... One tablet by mouth as needed for migraine, may repeat in 2 hours x 1 as needed  Patient Instructions: 1)  Please schedule a follow-up appointment in 2 weeks. 2)  Call if symptoms worsen or do not improve. Prescriptions: IMITREX 50 MG TABS (SUMATRIPTAN SUCCINATE) one tablet by mouth as needed for migraine, may repeat in 2 hours x 1  as needed  #6 x 0   Entered and Authorized by:   Lemont Fillers FNP   Signed by:   Lemont Fillers FNP on 11/22/2009   Method used:   Electronically to        [facility] (retail)       [address]       [place], [place]  [address]       Ph: [phone] or [phone]       Fax: [phone]   RxID:   [id number]   Current Allergies (reviewed today): ! PENICILLIN

## 2010-11-17 LAB — HEPATITIS B SURFACE ANTIGEN: Hepatitis B Surface Ag: NEGATIVE

## 2010-11-17 LAB — ABO/RH

## 2010-11-17 LAB — ANTIBODY SCREEN: Antibody Screen: NEGATIVE

## 2010-11-17 LAB — STREP B DNA PROBE: GBS: POSITIVE

## 2010-11-17 LAB — RUBELLA ANTIBODY, IGM: Rubella: IMMUNE

## 2010-11-25 LAB — HIV ANTIBODY (ROUTINE TESTING W REFLEX): HIV: NONREACTIVE

## 2010-11-25 LAB — GC/CHLAMYDIA PROBE AMP, GENITAL: Gonorrhea: NEGATIVE

## 2010-12-06 LAB — POCT I-STAT, CHEM 8
Calcium, Ion: 1.17 mmol/L (ref 1.12–1.32)
Creatinine, Ser: 0.7 mg/dL (ref 0.4–1.2)
Glucose, Bld: 88 mg/dL (ref 70–99)
HCT: 40 % (ref 36.0–46.0)
Hemoglobin: 13.6 g/dL (ref 12.0–15.0)
Potassium: 3.8 mEq/L (ref 3.5–5.1)
TCO2: 26 mmol/L (ref 0–100)

## 2011-04-26 ENCOUNTER — Encounter: Payer: BC Managed Care – PPO | Attending: Obstetrics and Gynecology

## 2011-04-26 DIAGNOSIS — O99810 Abnormal glucose complicating pregnancy: Secondary | ICD-10-CM | POA: Insufficient documentation

## 2011-04-26 DIAGNOSIS — Z713 Dietary counseling and surveillance: Secondary | ICD-10-CM | POA: Insufficient documentation

## 2011-05-14 NOTE — Progress Notes (Signed)
  Patient was seen on 04/26/2011 for Gestational Diabetes self-management class at the [facility]. The following learning objectives were met by the patient during this course:   States the definition of Gestational Diabetes  States why dietary management is important in controlling blood glucose  Describes the effects each nutrient has on blood glucose levels  Demonstrates ability to create a balanced meal plan  Demonstrates carbohydrate counting   States when to check blood glucose levels  Demonstrates proper blood glucose monitoring techniques  States the effect of stress and exercise on blood glucose levels  States the importance of limiting caffeine and abstaining from alcohol and smoking  Blood glucose monitor given: Accu-Chek SmartView Kit [employer]) Lot # [id number] Exp: 07/17/2012 Blood glucose reading: WNL  Patient instructed to monitor glucose levels: Fasting and 2 hours after first bite each meal. FBS: 60 - <90 1 hour: <140 2 hour: <120  *Patient received handouts:  Nutrition Diabetes and Pregnancy  Carbohydrate Counting List  Patient will be seen for follow-up as needed.

## 2011-06-07 ENCOUNTER — Telehealth (HOSPITAL_COMMUNITY): Payer: Self-pay | Admitting: *Deleted

## 2011-06-07 ENCOUNTER — Encounter (HOSPITAL_COMMUNITY): Payer: Self-pay | Admitting: *Deleted

## 2011-06-07 NOTE — Telephone Encounter (Signed)
Preadmission screen  

## 2011-06-10 ENCOUNTER — Inpatient Hospital Stay (HOSPITAL_COMMUNITY): Admission: AD | Admit: 2011-06-10 | Payer: Self-pay | Source: Ambulatory Visit | Admitting: Obstetrics and Gynecology

## 2011-06-22 ENCOUNTER — Encounter (HOSPITAL_COMMUNITY): Payer: Self-pay | Admitting: *Deleted

## 2011-06-23 ENCOUNTER — Inpatient Hospital Stay (HOSPITAL_COMMUNITY)
Admission: RE | Admit: 2011-06-23 | Discharge: 2011-06-28 | DRG: 651 | Disposition: A | Discharge status: Home / Self Care | Payer: BC Managed Care – PPO | Source: Ambulatory Visit | Attending: Obstetrics and Gynecology | Admitting: Obstetrics and Gynecology

## 2011-06-23 ENCOUNTER — Encounter (HOSPITAL_COMMUNITY): Payer: Self-pay

## 2011-06-23 DIAGNOSIS — G43009 Migraine without aura, not intractable, without status migrainosus: Secondary | ICD-10-CM

## 2011-06-23 DIAGNOSIS — F341 Dysthymic disorder: Secondary | ICD-10-CM

## 2011-06-23 DIAGNOSIS — O324XX0 Maternal care for high head at term, not applicable or unspecified: Secondary | ICD-10-CM | POA: Diagnosis present

## 2011-06-23 DIAGNOSIS — O24419 Gestational diabetes mellitus in pregnancy, unspecified control: Secondary | ICD-10-CM

## 2011-06-23 DIAGNOSIS — O99892 Other specified diseases and conditions complicating childbirth: Secondary | ICD-10-CM | POA: Diagnosis present

## 2011-06-23 DIAGNOSIS — O99814 Abnormal glucose complicating childbirth: Principal | ICD-10-CM | POA: Diagnosis present

## 2011-06-23 DIAGNOSIS — F988 Other specified behavioral and emotional disorders with onset usually occurring in childhood and adolescence: Secondary | ICD-10-CM

## 2011-06-23 DIAGNOSIS — M542 Cervicalgia: Secondary | ICD-10-CM

## 2011-06-23 DIAGNOSIS — Z22330 Carrier of Group B streptococcus: Secondary | ICD-10-CM

## 2011-06-23 LAB — CBC
Hemoglobin: 12.2 g/dL (ref 12.0–15.0)
MCHC: 35.1 g/dL (ref 30.0–36.0)
Platelets: 131 10*3/uL — ABNORMAL LOW (ref 150–400)
RBC: 4 MIL/uL (ref 3.87–5.11)

## 2011-06-23 LAB — GLUCOSE, RANDOM: Glucose, Bld: 80 mg/dL (ref 70–99)

## 2011-06-23 MED ORDER — LACTATED RINGERS IV SOLN: 500.0000 mL | INTRAVENOUS | Status: DC | PRN

## 2011-06-23 MED ORDER — LACTATED RINGERS IV SOLN
INTRAVENOUS | Status: DC
  Administered 2011-06-23: 125 mL/h via INTRAVENOUS
  Administered 2011-06-24: 21:00:00 via INTRAVENOUS
  Administered 2011-06-24 (×2): 125 mL/h via INTRAVENOUS
  Administered 2011-06-24: 01:00:00 via INTRAVENOUS
  Administered 2011-06-25: 1000 mL via INTRAVENOUS

## 2011-06-23 MED ORDER — LIDOCAINE HCL (PF) 1 % IJ SOLN
30.0000 mL | INTRAMUSCULAR | Status: DC | PRN
  Filled 2011-06-23: qty 30

## 2011-06-23 MED ORDER — MISOPROSTOL 25 MCG QUARTER TABLET
25.0000 ug | ORAL_TABLET | ORAL | Status: DC | PRN
  Administered 2011-06-23: 25 ug via VAGINAL
  Filled 2011-06-23: qty 0.25
  Filled 2011-06-23: qty 1

## 2011-06-23 MED ORDER — OXYTOCIN 20 UNITS IN LACTATED RINGERS INFUSION - SIMPLE
1.0000 m[IU]/min | INTRAVENOUS | Status: DC
  Administered 2011-06-24: 10 m[IU]/min via INTRAVENOUS
  Administered 2011-06-24: 2 m[IU]/min via INTRAVENOUS
  Administered 2011-06-24: 14 m[IU]/min via INTRAVENOUS
  Filled 2011-06-23 (×2): qty 1000

## 2011-06-23 MED ORDER — OXYTOCIN BOLUS FROM INFUSION
500.0000 mL | Freq: Once | INTRAVENOUS | Status: DC
  Filled 2011-06-23: qty 500

## 2011-06-23 MED ORDER — IBUPROFEN 600 MG PO TABS: 600.0000 mg | ORAL_TABLET | Freq: Four times a day (QID) | ORAL | Status: DC | PRN

## 2011-06-23 MED ORDER — ONDANSETRON HCL 4 MG/2ML IJ SOLN
4.0000 mg | Freq: Four times a day (QID) | INTRAMUSCULAR | Status: DC | PRN
  Administered 2011-06-24: 4 mg via INTRAVENOUS
  Filled 2011-06-23: qty 2

## 2011-06-23 MED ORDER — OXYCODONE-ACETAMINOPHEN 5-325 MG PO TABS: 2.0000 | ORAL_TABLET | ORAL | Status: DC | PRN

## 2011-06-23 MED ORDER — TERBUTALINE SULFATE 1 MG/ML IJ SOLN: 0.2500 mg | Freq: Once | INTRAMUSCULAR | Status: AC | PRN

## 2011-06-23 MED ORDER — OXYTOCIN 20 UNITS IN LACTATED RINGERS INFUSION - SIMPLE: 125.0000 mL/h | Freq: Once | INTRAVENOUS | Status: DC

## 2011-06-23 MED ORDER — CLINDAMYCIN PHOSPHATE 900 MG/50ML IV SOLN
900.0000 mg | Freq: Once | INTRAVENOUS | Status: AC
  Administered 2011-06-23: 900 mg via INTRAVENOUS
  Filled 2011-06-23: qty 50

## 2011-06-23 MED ORDER — CITRIC ACID-SODIUM CITRATE 334-500 MG/5ML PO SOLN
30.0000 mL | ORAL | Status: DC | PRN
  Administered 2011-06-25: 30 mL via ORAL
  Filled 2011-06-23: qty 15

## 2011-06-23 MED ORDER — OXYTOCIN 10 UNIT/ML IJ SOLN: 10.0000 [IU] | Freq: Once | INTRAMUSCULAR | Status: DC

## 2011-06-23 MED ORDER — ZOLPIDEM TARTRATE 10 MG PO TABS
10.0000 mg | ORAL_TABLET | Freq: Every evening | ORAL | Status: DC | PRN
  Administered 2011-06-23: 10 mg via ORAL
  Filled 2011-06-23: qty 1

## 2011-06-23 MED ORDER — ACETAMINOPHEN 325 MG PO TABS
650.0000 mg | ORAL_TABLET | ORAL | Status: DC | PRN
  Administered 2011-06-23 – 2011-06-24 (×2): 650 mg via ORAL
  Filled 2011-06-23 (×2): qty 2

## 2011-06-23 NOTE — Progress Notes (Signed)
Dr Cherly Hensen called unit to review orders.

## 2011-06-23 NOTE — Plan of Care (Signed)
Problem: Consults Goal: Birthing Suites Patient Information Press F2 to bring up selections list Outcome: Completed/Met Date Met:  06/23/11  Pt 37-[redacted] weeks EGA and Inpatient induction

## 2011-06-23 NOTE — H&P (Signed)
[name] [name] is a 30 y.o. female G2P0010  MWF now @ 39 2/[redacted] wk gestation presenting for induction of labor due to Class A2 GDM History OB History    Grav Para Term Preterm Abortions TAB SAB Ect Mult Living   2 0   1 1    0     Past Medical History  Diagnosis Date  . Headache     migraine  . Depression     weaned off meds during pregnancy  . History of condyloma acuminatum   . History of chicken pox   . Anxiety   . Gestational diabetes     glyburide    Past Surgical History  Procedure Date  . Knee surgery     L meniscus tear   Family History: family history is negative for Anesthesia problems, and Hypotension, and Malignant hyperthermia, and Pseudochol deficiency, . Social History:  reports that she quit smoking about 8 months ago. She has never used smokeless tobacco. She reports that she does not drink alcohol or use illicit drugs.  ROSneg  Dilation: 1.5 Effacement (%): 90 Station: -2 Exam by:: N Psychologist, counselling Blood pressure 128/68, pulse 107, temperature 97.6 F (36.4 C), temperature source Oral, resp. rate 18, height 5\' 6"  (1.676 m), weight 226 lb (102.513 kg), last menstrual period 09/21/2010. Exam Physical Exam  Constitutional: She is oriented to person, place, and time. She appears well-developed and well-nourished.  HENT:  Head: Normocephalic.  Eyes: EOM are normal.  Neck: Neck supple.  Cardiovascular: Regular rhythm.   Respiratory: Breath sounds normal.  GI: Soft.  Musculoskeletal: Normal range of motion.  Neurological: She is alert and oriented to person, place, and time.  Skin: Skin is warm and dry.  Psychiatric: She has a normal mood and affect.    Prenatal labs: ABO, Rh: AB/Positive/-- (03/22 0000) Antibody: Negative (03/22 0000) Rubella: Immune (03/22 0000) RPR: Nonreactive (03/22 0000)  HBsAg: Negative (03/22 0000)  HIV: Non-reactive (03/30 0000)  GBS: Positive (10/02 0000)   Assessment/Plan: CLass A2 GDM Term gestation GBS cx (+) P) Admit   Routine labs. Cytotec. Epidural prn. BS q 4 hr. Clindamycin IV q 8 hrs. Pitocin in am  Jatavis Malek A 06/23/2011, 11:34 PM

## 2011-06-24 LAB — GLUCOSE, CAPILLARY
Glucose-Capillary: 88 mg/dL (ref 70–99)
Glucose-Capillary: 77 mg/dL (ref 70–99)
Glucose-Capillary: 73 mg/dL (ref 70–99)
Glucose-Capillary: 67 mg/dL — ABNORMAL LOW (ref 70–99)

## 2011-06-24 LAB — RPR: RPR Ser Ql: NONREACTIVE

## 2011-06-24 MED ORDER — EPHEDRINE 5 MG/ML INJ
10.0000 mg | INTRAVENOUS | Status: DC | PRN
  Filled 2011-06-24: qty 4

## 2011-06-24 MED ORDER — NALBUPHINE SYRINGE 5 MG/0.5 ML
10.0000 mg | INJECTION | INTRAMUSCULAR | Status: DC | PRN
  Administered 2011-06-24 (×2): 10 mg via INTRAMUSCULAR
  Filled 2011-06-24: qty 1
  Filled 2011-06-24: qty 0.5
  Filled 2011-06-24: qty 1
  Filled 2011-06-24: qty 0.5

## 2011-06-24 MED ORDER — DIPHENHYDRAMINE HCL 50 MG/ML IJ SOLN: 12.5000 mg | INTRAMUSCULAR | Status: DC | PRN

## 2011-06-24 MED ORDER — BUPIVACAINE HCL (PF) 0.25 % IJ SOLN
INTRAMUSCULAR | Status: DC | PRN
  Administered 2011-06-24: 5 mL via EPIDURAL

## 2011-06-24 MED ORDER — PHENYLEPHRINE 40 MCG/ML (10ML) SYRINGE FOR IV PUSH (FOR BLOOD PRESSURE SUPPORT)
80.0000 ug | PREFILLED_SYRINGE | INTRAVENOUS | Status: DC | PRN
  Filled 2011-06-24: qty 5

## 2011-06-24 MED ORDER — LACTATED RINGERS IV SOLN
INTRAVENOUS | Status: DC
  Administered 2011-06-24: 999 mL/h via INTRAUTERINE
  Administered 2011-06-24: 150 mL/h via INTRAUTERINE

## 2011-06-24 MED ORDER — FENTANYL 2.5 MCG/ML BUPIVACAINE 1/10 % EPIDURAL INFUSION (WH - ANES)
14.0000 mL/h | INTRAMUSCULAR | Status: DC
  Administered 2011-06-24 – 2011-06-25 (×6): 14 mL/h via EPIDURAL
  Filled 2011-06-24 (×5): qty 60

## 2011-06-24 MED ORDER — PHENYLEPHRINE 40 MCG/ML (10ML) SYRINGE FOR IV PUSH (FOR BLOOD PRESSURE SUPPORT)
80.0000 ug | PREFILLED_SYRINGE | INTRAVENOUS | Status: DC | PRN
  Filled 2011-06-24 (×2): qty 5

## 2011-06-24 MED ORDER — NALBUPHINE SYRINGE 5 MG/0.5 ML
10.0000 mg | INJECTION | INTRAMUSCULAR | Status: DC | PRN
  Administered 2011-06-24 (×2): 10 mg via INTRAVENOUS
  Filled 2011-06-24: qty 0.5
  Filled 2011-06-24 (×2): qty 1
  Filled 2011-06-24: qty 0.5

## 2011-06-24 MED ORDER — HYDROXYZINE HCL 50 MG/ML IM SOLN
50.0000 mg | INTRAMUSCULAR | Status: DC | PRN
  Administered 2011-06-24 (×2): 50 mg via INTRAMUSCULAR
  Filled 2011-06-24 (×3): qty 1

## 2011-06-24 MED ORDER — SALINE SPRAY 0.65 % NA SOLN
2.0000 | NASAL | Status: DC | PRN
  Administered 2011-06-24: 2 via NASAL
  Filled 2011-06-24: qty 44

## 2011-06-24 MED ORDER — CLINDAMYCIN PHOSPHATE 900 MG/50ML IV SOLN
900.0000 mg | Freq: Three times a day (TID) | INTRAVENOUS | Status: DC
  Administered 2011-06-24 (×3): 900 mg via INTRAVENOUS
  Filled 2011-06-24 (×5): qty 50

## 2011-06-24 MED ORDER — LIDOCAINE HCL 1.5 % IJ SOLN
INTRAMUSCULAR | Status: DC | PRN
  Administered 2011-06-24: 2 mL via INTRADERMAL
  Administered 2011-06-24 (×2): 5 mL via INTRADERMAL

## 2011-06-24 MED ORDER — LACTATED RINGERS IV SOLN
500.0000 mL | Freq: Once | INTRAVENOUS | Status: AC
  Administered 2011-06-24: 999 mL via INTRAVENOUS

## 2011-06-24 MED ORDER — EPHEDRINE 5 MG/ML INJ
10.0000 mg | INTRAVENOUS | Status: DC | PRN
  Filled 2011-06-24 (×2): qty 4

## 2011-06-24 NOTE — Progress Notes (Signed)
S: crying due to ctx. Ctx q 5-7 mins S/P cytotec. Currently on Pitocin  O: loose 1 cm /90%/-2 RN BS 81 Tracing: baseline 155 NR  ? Ctx freq  IMP: latent Phase Class A2 GDM GBS cx (+) on PCN  P) Analgesic  Cont increase pitocin.  cont clindamycin

## 2011-06-24 NOTE — Progress Notes (Signed)
S: comfortable Epidural Pitocin 20 MIU  VE: 6-7 cm /90%/+1 bloody show  Tracing reactive ctx q 2 mins baseline 145  Imp: protracted /arrest of dilation Class A2 GDM  GBS cx (+)'  P) reexamine in 2hr if no change need C/S. Cont PCN

## 2011-06-24 NOTE — Anesthesia Preprocedure Evaluation (Signed)
Anesthesia Evaluation  Patient identified by MRN, date of birth, ID band Patient awake  General Assessment Comment  Reviewed: Allergy & Precautions, H&P , NPO status , Patient's Chart, lab work & pertinent test results, reviewed documented beta blocker date and time   History of Anesthesia Complications Negative for: history of anesthetic complications  Airway Mallampati: III TM Distance: >3 FB Neck ROM: full    Dental  (+) Teeth Intact   Pulmonary  clear to auscultation        Cardiovascular regular Normal    Neuro/Psych PSYCHIATRIC DISORDERS (anxiety, depression) Muscular back pain during pregnancy    GI/Hepatic negative GI ROS Neg liver ROS    Endo/Other  Diabetes mellitus-Morbid obesity  Renal/GU negative Renal ROS     Musculoskeletal   Abdominal   Peds  Hematology negative hematology ROS (+)   Anesthesia Other Findings   Reproductive/Obstetrics (+) Pregnancy                           Anesthesia Physical Anesthesia Plan  ASA: II  Anesthesia Plan: Epidural   Post-op Pain Management:    Induction:   Airway Management Planned:   Additional Equipment:   Intra-op Plan:   Post-operative Plan:   Informed Consent: I have reviewed the patients History and Physical, chart, labs and discussed the procedure including the risks, benefits and alternatives for the proposed anesthesia with the patient or authorized representative who has indicated his/her understanding and acceptance.     Plan Discussed with:   Anesthesia Plan Comments:         Anesthesia Quick Evaluation

## 2011-06-24 NOTE — Progress Notes (Signed)
S:  Notes some vaginal pressure  Epidural. Pitocin 20 MIU  O: VE: 6-7 cm/90%/0 +1 station large bloody show ROP asyncyltic  Tracing; baseline 145 (+) small variable decel  Ctx q 1 1/2 to 2 1/2 mins ( units)  BS=76  I/P: Active Phase GBS cx (+) on PCN Term Gestation Class A2 GDM P) cont pitocin. Exaggerated Sims right cont PCN

## 2011-06-24 NOTE — Progress Notes (Signed)
S: Notes painful ctx  O: Pitocin 14 MIU  VE 3/70%/-1 AROM clear fluid ? Light mec.  IUPC placed  Tracing: baseline 145  NR occ variable  ctx q  2-4 mins  BS 79  A/P Latent Phase Class A2 GDM GBS cx (+) P) cont pitocin. Cont IV PCN. epidural

## 2011-06-24 NOTE — Anesthesia Procedure Notes (Signed)
Epidural Patient location during procedure: OB Start time: 06/24/2011 12:05 PM Reason for block: procedure for pain  Staffing Performed by: anesthesiologist   Preanesthetic Checklist Completed: patient identified, site marked, surgical consent, pre-op evaluation, timeout performed, IV checked, risks and benefits discussed and monitors and equipment checked  Epidural Patient position: sitting Prep: site prepped and draped and DuraPrep Patient monitoring: continuous pulse ox and blood pressure Approach: midline Injection technique: LOR air  Needle:  Needle type: Tuohy  Needle gauge: 17 G Needle length: 9 cm Needle insertion depth: 6 cm Catheter type: closed end flexible Catheter size: 19 Gauge Catheter at skin depth: 11 cm Test dose: negative  Assessment Events: blood not aspirated, injection not painful, no injection resistance, negative IV test and no paresthesia  Additional Notes Discussed risk of headache, infection, bleeding, nerve injury and failed or incomplete block.  Patient voices understanding and wishes to proceed.

## 2011-06-24 NOTE — Progress Notes (Signed)
S: comfortable.  O: VE 9 + cm dilated per RN  IMP: active phase  Class a2 GDM  GBS cx (+)  P) defer C/S cont present mgmt. Recheck 2 hr

## 2011-06-25 ENCOUNTER — Other Ambulatory Visit: Payer: Self-pay | Admitting: Obstetrics and Gynecology

## 2011-06-25 ENCOUNTER — Encounter (HOSPITAL_COMMUNITY): Payer: Self-pay | Admitting: Anesthesiology

## 2011-06-25 ENCOUNTER — Encounter (HOSPITAL_COMMUNITY)
Admission: RE | Disposition: A | Discharge status: Home / Self Care | Payer: Self-pay | Source: Ambulatory Visit | Attending: Obstetrics and Gynecology

## 2011-06-25 ENCOUNTER — Inpatient Hospital Stay (HOSPITAL_COMMUNITY): Payer: BC Managed Care – PPO | Admitting: Anesthesiology

## 2011-06-25 ENCOUNTER — Encounter (HOSPITAL_COMMUNITY): Payer: Self-pay

## 2011-06-25 SURGERY — Surgical Case
Anesthesia: Epidural | Site: Abdomen | Wound class: Clean Contaminated

## 2011-06-25 MED ORDER — SODIUM BICARBONATE 8.4 % IV SOLN
INTRAVENOUS | Status: DC | PRN
  Administered 2011-06-25: 5 mL via EPIDURAL

## 2011-06-25 MED ORDER — BUPIVACAINE HCL (PF) 0.25 % IJ SOLN
INTRAMUSCULAR | Status: DC | PRN
  Administered 2011-06-25: 8 mL

## 2011-06-25 MED ORDER — OXYTOCIN 10 UNIT/ML IJ SOLN
INTRAMUSCULAR | Status: DC | PRN
  Administered 2011-06-25: 40 [IU]
  Administered 2011-06-25: 20 [IU]

## 2011-06-25 MED ORDER — DIPHENHYDRAMINE HCL 50 MG/ML IJ SOLN: 25.0000 mg | INTRAMUSCULAR | Status: DC | PRN

## 2011-06-25 MED ORDER — SODIUM CHLORIDE 0.9 % IR SOLN
Status: DC | PRN
  Administered 2011-06-25: 1000 mL

## 2011-06-25 MED ORDER — NALBUPHINE HCL 10 MG/ML IJ SOLN
5.0000 mg | INTRAMUSCULAR | Status: DC | PRN
  Filled 2011-06-25: qty 1

## 2011-06-25 MED ORDER — DIPHENHYDRAMINE HCL 25 MG PO CAPS: 25.0000 mg | ORAL_CAPSULE | Freq: Four times a day (QID) | ORAL | Status: DC | PRN

## 2011-06-25 MED ORDER — MORPHINE SULFATE 0.5 MG/ML IJ SOLN
INTRAMUSCULAR | Status: AC
  Filled 2011-06-25: qty 10

## 2011-06-25 MED ORDER — SCOPOLAMINE 1 MG/3DAYS TD PT72
1.0000 | MEDICATED_PATCH | Freq: Once | TRANSDERMAL | Status: AC
  Administered 2011-06-25: 1.5 mg via TRANSDERMAL

## 2011-06-25 MED ORDER — LACTATED RINGERS IV SOLN
INTRAVENOUS | Status: DC | PRN
  Administered 2011-06-25 (×2): via INTRAVENOUS

## 2011-06-25 MED ORDER — METHYLERGONOVINE MALEATE 0.2 MG/ML IJ SOLN: 0.2000 mg | INTRAMUSCULAR | Status: DC | PRN

## 2011-06-25 MED ORDER — FENTANYL CITRATE 0.05 MG/ML IJ SOLN
INTRAMUSCULAR | Status: DC | PRN
  Administered 2011-06-25: 100 ug via INTRAVENOUS

## 2011-06-25 MED ORDER — DIPHENHYDRAMINE HCL 50 MG/ML IJ SOLN: 12.5000 mg | INTRAMUSCULAR | Status: DC | PRN

## 2011-06-25 MED ORDER — LANOLIN HYDROUS EX OINT: 1.0000 "application " | TOPICAL_OINTMENT | CUTANEOUS | Status: DC | PRN

## 2011-06-25 MED ORDER — PHENYLEPHRINE 40 MCG/ML (10ML) SYRINGE FOR IV PUSH (FOR BLOOD PRESSURE SUPPORT)
PREFILLED_SYRINGE | INTRAVENOUS | Status: AC
  Filled 2011-06-25: qty 10

## 2011-06-25 MED ORDER — ZOLPIDEM TARTRATE 10 MG PO TABS: 10.0000 mg | ORAL_TABLET | Freq: Every evening | ORAL | Status: DC | PRN

## 2011-06-25 MED ORDER — FLEET ENEMA 7-19 GM/118ML RE ENEM: 1.0000 | ENEMA | RECTAL | Status: DC | PRN

## 2011-06-25 MED ORDER — SODIUM CHLORIDE 0.9 % IV SOLN
1.0000 ug/kg/h | INTRAVENOUS | Status: DC | PRN
  Filled 2011-06-25: qty 2.5

## 2011-06-25 MED ORDER — METHYLERGONOVINE MALEATE 0.2 MG PO TABS: 0.2000 mg | ORAL_TABLET | ORAL | Status: DC | PRN

## 2011-06-25 MED ORDER — METOCLOPRAMIDE HCL 5 MG/ML IJ SOLN: 10.0000 mg | Freq: Three times a day (TID) | INTRAMUSCULAR | Status: DC | PRN

## 2011-06-25 MED ORDER — DIPHENHYDRAMINE HCL 25 MG PO CAPS: 25.0000 mg | ORAL_CAPSULE | ORAL | Status: DC | PRN

## 2011-06-25 MED ORDER — PHENYLEPHRINE 40 MCG/ML (10ML) SYRINGE FOR IV PUSH (FOR BLOOD PRESSURE SUPPORT)
PREFILLED_SYRINGE | INTRAVENOUS | Status: AC
  Filled 2011-06-25: qty 5

## 2011-06-25 MED ORDER — NALBUPHINE HCL 10 MG/ML IJ SOLN
5.0000 mg | INTRAMUSCULAR | Status: DC | PRN
  Administered 2011-06-25: 10 mg via INTRAVENOUS
  Filled 2011-06-25 (×2): qty 1

## 2011-06-25 MED ORDER — OXYTOCIN 10 UNIT/ML IJ SOLN
INTRAMUSCULAR | Status: AC
  Filled 2011-06-25: qty 2

## 2011-06-25 MED ORDER — ONDANSETRON HCL 4 MG/2ML IJ SOLN: 4.0000 mg | Freq: Three times a day (TID) | INTRAMUSCULAR | Status: DC | PRN

## 2011-06-25 MED ORDER — SIMETHICONE 80 MG PO CHEW
80.0000 mg | CHEWABLE_TABLET | Freq: Three times a day (TID) | ORAL | Status: DC
  Administered 2011-06-25 – 2011-06-28 (×13): 80 mg via ORAL

## 2011-06-25 MED ORDER — MEPERIDINE HCL 25 MG/ML IJ SOLN
6.2500 mg | INTRAMUSCULAR | Status: AC | PRN
  Administered 2011-06-25 (×2): 12.5 mg via INTRAVENOUS

## 2011-06-25 MED ORDER — OXYCODONE-ACETAMINOPHEN 5-325 MG PO TABS
1.0000 | ORAL_TABLET | ORAL | Status: DC | PRN
  Administered 2011-06-25: 1 via ORAL
  Administered 2011-06-25: 2 via ORAL
  Administered 2011-06-25: 1 via ORAL
  Administered 2011-06-25 (×2): 2 via ORAL
  Administered 2011-06-26 (×3): 1 via ORAL
  Administered 2011-06-26: 2 via ORAL
  Administered 2011-06-26: 1 via ORAL
  Administered 2011-06-26 – 2011-06-28 (×11): 2 via ORAL
  Filled 2011-06-25 (×3): qty 2
  Filled 2011-06-25: qty 1
  Filled 2011-06-25: qty 2
  Filled 2011-06-25: qty 1
  Filled 2011-06-25 (×14): qty 2

## 2011-06-25 MED ORDER — PRENATAL PLUS 27-1 MG PO TABS
1.0000 | ORAL_TABLET | Freq: Every day | ORAL | Status: DC
  Administered 2011-06-25 – 2011-06-28 (×4): 1 via ORAL
  Filled 2011-06-25 (×3): qty 1

## 2011-06-25 MED ORDER — ACETAMINOPHEN 10 MG/ML IV SOLN
1000.0000 mg | Freq: Four times a day (QID) | INTRAVENOUS | Status: DC
  Filled 2011-06-25 (×3): qty 100

## 2011-06-25 MED ORDER — ONDANSETRON HCL 4 MG/2ML IJ SOLN
INTRAMUSCULAR | Status: AC
  Filled 2011-06-25: qty 2

## 2011-06-25 MED ORDER — SCOPOLAMINE 1 MG/3DAYS TD PT72
MEDICATED_PATCH | TRANSDERMAL | Status: AC
  Filled 2011-06-25: qty 1

## 2011-06-25 MED ORDER — OXYTOCIN 10 UNIT/ML IJ SOLN
INTRAMUSCULAR | Status: AC
  Filled 2011-06-25: qty 4

## 2011-06-25 MED ORDER — SODIUM CHLORIDE 0.9 % IJ SOLN: 3.0000 mL | Freq: Two times a day (BID) | INTRAMUSCULAR | Status: DC

## 2011-06-25 MED ORDER — SODIUM CHLORIDE 0.9 % IV SOLN: 250.0000 mL | INTRAVENOUS | Status: DC

## 2011-06-25 MED ORDER — SODIUM CHLORIDE 0.9 % IJ SOLN: 3.0000 mL | INTRAMUSCULAR | Status: DC | PRN

## 2011-06-25 MED ORDER — ONDANSETRON HCL 4 MG/2ML IJ SOLN: 4.0000 mg | INTRAMUSCULAR | Status: DC | PRN

## 2011-06-25 MED ORDER — SENNOSIDES-DOCUSATE SODIUM 8.6-50 MG PO TABS
2.0000 | ORAL_TABLET | Freq: Every day | ORAL | Status: DC
  Administered 2011-06-25 – 2011-06-27 (×3): 2 via ORAL

## 2011-06-25 MED ORDER — SIMETHICONE 80 MG PO CHEW: 80.0000 mg | CHEWABLE_TABLET | ORAL | Status: DC | PRN

## 2011-06-25 MED ORDER — ONDANSETRON HCL 4 MG PO TABS: 4.0000 mg | ORAL_TABLET | ORAL | Status: DC | PRN

## 2011-06-25 MED ORDER — DIBUCAINE 1 % RE OINT: 1.0000 "application " | TOPICAL_OINTMENT | RECTAL | Status: DC | PRN

## 2011-06-25 MED ORDER — BISACODYL 10 MG RE SUPP: 10.0000 mg | Freq: Every day | RECTAL | Status: DC | PRN

## 2011-06-25 MED ORDER — GENTAMICIN SULFATE 40 MG/ML IJ SOLN
Freq: Once | INTRAVENOUS | Status: AC
  Administered 2011-06-25: 02:00:00 via INTRAVENOUS
  Filled 2011-06-25: qty 4.75

## 2011-06-25 MED ORDER — ZOLPIDEM TARTRATE 5 MG PO TABS: 5.0000 mg | ORAL_TABLET | Freq: Every evening | ORAL | Status: DC | PRN

## 2011-06-25 MED ORDER — PHENYLEPHRINE HCL 10 MG/ML IJ SOLN
INTRAMUSCULAR | Status: DC | PRN
  Administered 2011-06-25: 120 ug via INTRAVENOUS
  Administered 2011-06-25: 80 ug via INTRAVENOUS
  Administered 2011-06-25: 120 ug via INTRAVENOUS
  Administered 2011-06-25 (×2): 80 ug via INTRAVENOUS

## 2011-06-25 MED ORDER — MEPERIDINE HCL 25 MG/ML IJ SOLN
INTRAMUSCULAR | Status: AC
  Administered 2011-06-25: 12.5 mg via INTRAVENOUS
  Filled 2011-06-25: qty 1

## 2011-06-25 MED ORDER — MENTHOL 3 MG MT LOZG: 1.0000 | LOZENGE | OROMUCOSAL | Status: DC | PRN

## 2011-06-25 MED ORDER — MORPHINE SULFATE (PF) 0.5 MG/ML IJ SOLN
INTRAMUSCULAR | Status: DC | PRN
  Administered 2011-06-25: 1 mg via INTRAVENOUS

## 2011-06-25 MED ORDER — GENTAMICIN SULFATE 40 MG/ML IJ SOLN
Freq: Three times a day (TID) | INTRAVENOUS | Status: DC
  Administered 2011-06-25: 10:00:00 via INTRAVENOUS
  Filled 2011-06-25 (×2): qty 4.5

## 2011-06-25 MED ORDER — OXYTOCIN 20 UNITS IN LACTATED RINGERS INFUSION - SIMPLE: 125.0000 mL/h | INTRAVENOUS | Status: AC

## 2011-06-25 MED ORDER — WITCH HAZEL-GLYCERIN EX PADS: 1.0000 "application " | MEDICATED_PAD | CUTANEOUS | Status: DC | PRN

## 2011-06-25 MED ORDER — ONDANSETRON HCL 4 MG/2ML IJ SOLN
INTRAMUSCULAR | Status: DC | PRN
  Administered 2011-06-25: 4 mg via INTRAVENOUS

## 2011-06-25 MED ORDER — TETANUS-DIPHTH-ACELL PERTUSSIS 5-2.5-18.5 LF-MCG/0.5 IM SUSP
0.5000 mL | Freq: Once | INTRAMUSCULAR | Status: AC
  Administered 2011-06-26: 0.5 mL via INTRAMUSCULAR
  Filled 2011-06-25: qty 0.5

## 2011-06-25 MED ORDER — MORPHINE SULFATE (PF) 0.5 MG/ML IJ SOLN
INTRAMUSCULAR | Status: DC | PRN
  Administered 2011-06-25: 4 mg via EPIDURAL

## 2011-06-25 MED ORDER — NALOXONE HCL 0.4 MG/ML IJ SOLN: 0.4000 mg | INTRAMUSCULAR | Status: DC | PRN

## 2011-06-25 MED ORDER — FENTANYL CITRATE 0.05 MG/ML IJ SOLN
INTRAMUSCULAR | Status: AC
  Filled 2011-06-25: qty 2

## 2011-06-25 SURGICAL SUPPLY — 41 items
BENZOIN TINCTURE PRP APPL 2/3 (GAUZE/BANDAGES/DRESSINGS) IMPLANT
CLOTH BEACON ORANGE TIMEOUT ST (SAFETY) ×2 IMPLANT
CONTAINER PREFILL 10% NBF 15ML (MISCELLANEOUS) IMPLANT
DRESSING TELFA 8X3 (GAUZE/BANDAGES/DRESSINGS) ×2 IMPLANT
ELECT REM PT RETURN 9FT ADLT (ELECTROSURGICAL) ×2
ELECTRODE REM PT RTRN 9FT ADLT (ELECTROSURGICAL) ×1 IMPLANT
EXTRACTOR VACUUM KIWI (MISCELLANEOUS) IMPLANT
EXTRACTOR VACUUM M CUP 4 TUBE (SUCTIONS) IMPLANT
GAUZE SPONGE 4X4 12PLY STRL LF (GAUZE/BANDAGES/DRESSINGS) ×4 IMPLANT
GLOVE BIO SURGEON STRL SZ 6.5 (GLOVE) IMPLANT
GLOVE BIOGEL PI IND STRL 7.0 (GLOVE) ×2 IMPLANT
GLOVE BIOGEL PI INDICATOR 7.0 (GLOVE) ×2
GOWN PREVENTION PLUS LG XLONG (DISPOSABLE) ×6 IMPLANT
KIT ABG SYR 3ML LUER SLIP (SYRINGE) IMPLANT
NEEDLE HYPO 25X1 1.5 SAFETY (NEEDLE) IMPLANT
NEEDLE HYPO 25X5/8 SAFETYGLIDE (NEEDLE) IMPLANT
NS IRRIG 1000ML POUR BTL (IV SOLUTION) ×2 IMPLANT
PACK C SECTION WH (CUSTOM PROCEDURE TRAY) ×2 IMPLANT
PAD ABD 7.5X8 STRL (GAUZE/BANDAGES/DRESSINGS) ×2 IMPLANT
RTRCTR C-SECT PINK 25CM LRG (MISCELLANEOUS) IMPLANT
SLEEVE SCD COMPRESS KNEE MED (MISCELLANEOUS) ×2 IMPLANT
SPONGE GAUZE 4X4 12PLY (GAUZE/BANDAGES/DRESSINGS) ×2 IMPLANT
STAPLER VISISTAT 35W (STAPLE) ×2 IMPLANT
STRIP CLOSURE SKIN 1/2X4 (GAUZE/BANDAGES/DRESSINGS) IMPLANT
SUT CHROMIC GUT AB #0 18 (SUTURE) IMPLANT
SUT MNCRL 0 VIOLET CTX 36 (SUTURE) ×3 IMPLANT
SUT MON AB 4-0 PS1 27 (SUTURE) IMPLANT
SUT MONOCRYL 0 CTX 36 (SUTURE) ×3
SUT PLAIN 2 0 (SUTURE)
SUT PLAIN 2 0 XLH (SUTURE) ×4 IMPLANT
SUT PLAIN ABS 2-0 CT1 27XMFL (SUTURE) IMPLANT
SUT VIC AB 0 CT1 27 (SUTURE) ×2
SUT VIC AB 0 CT1 27XBRD ANBCTR (SUTURE) ×2 IMPLANT
SUT VIC AB 2-0 CT1 27 (SUTURE) ×1
SUT VIC AB 2-0 CT1 TAPERPNT 27 (SUTURE) ×1 IMPLANT
SUT VICRYL 0 TIES 12 18 (SUTURE) IMPLANT
SYR CONTROL 10ML LL (SYRINGE) ×2 IMPLANT
TAPE CLOTH SURG 4X10 WHT LF (GAUZE/BANDAGES/DRESSINGS) ×2 IMPLANT
TOWEL OR 17X24 6PK STRL BLUE (TOWEL DISPOSABLE) ×4 IMPLANT
TRAY FOLEY CATH 14FR (SET/KITS/TRAYS/PACK) IMPLANT
WATER STERILE IRR 1000ML POUR (IV SOLUTION) ×2 IMPLANT

## 2011-06-25 NOTE — Progress Notes (Signed)
  INTERVAL NOTE:  S:  Sitting in bed, tolerated PO, breast fed x 1, (+) pain when sitting at [facility],  Foley cath in place, small bleed, denies HA/NV/dizziness  O:  VSS, AAO x 3, NAD  FF bellow U,  Abdomen w/ sig. gas distention  Scant lochia  SCD's in place  A / P:   POD #0, P C/S failure to descend   Stable post op  Routine orders  Encouraged early ambulation, warm PO fluids  Consult anesthesia for better pain control next 24 hrs  PAUL,DANIELA, CNM, MSN  06/25/2011 10:52 AM

## 2011-06-25 NOTE — Progress Notes (Signed)
Dr. Cherly Hensen at bedside, discussing with pt plan of care and c/s for arrest of descent. Discussing risks and benefits of c-section, pt verbalizes understanding and consents to c-section. FOB, mother, and sister at bedside

## 2011-06-25 NOTE — Progress Notes (Signed)
S: Pushing x 1 1/2 hours. Exhausted. Epidural  O: Fully dilated 1+ station small caput. No descent noted with pushing at all   Tracing: reassuring baseline 145 (+) accels  Ctx q 2-3 mins  Imp: Arrest of descent Class A2 GDM Term gestation.  Given GDM and protracted active phase, doubt cont pushing will result in a vaginal delivery. If so, may have higher risk for shoulder dystocia. Therefore, C/S recommended  P)Primary C/S. Pt agrees. Procedure explained. Risk and benefit of surgery disc including infection, bleeding, poss blood transfusion and its risk, internal scar tissue, injury to bladder, bowel, ureter. All ? Answered pertaining to surgery. OR notified

## 2011-06-25 NOTE — Progress Notes (Signed)
S: Pushing  O: VE: complete (+) station min descent  Tracing: reassuring \\ baseline 145 Ctx q 2-3 mins  IMP: Prob arrest of descent  P) reassess @ 2 pm if no change C/S. Pt notified

## 2011-06-25 NOTE — Brief Op Note (Signed)
06/23/2011 - 06/25/2011  3:36 AM  PATIENT:  [name] [name]  30 y.o. female  PRE-OPERATIVE DIAGNOSIS:  Arrest of descent, term gestation, Class A2 GDM  POST-OPERATIVE DIAGNOSIS:  Arrest of descent, Class A2 GDM , term gestation  PROCEDURE:  Procedure(s): PRIMARY CESAREAN SECTION, Sharl Ma hysterotomy  SURGEON:  Surgeon(s): Sundance Moise Cathie Beams, MD  PHYSICIAN ASSISTANT:   ASSISTANTS: Arlan Organ, CNM ANESTHESIA:   epidural   FINDINGS: live female LOT nl tubes and ovaries, APGAR 8/9. Wt 8lb 15 oz  EBL:   800 I/O 1100/100  BLOOD ADMINISTERED:none  DRAINS: none   LOCAL MEDICATIONS USED:  MARCAINE 8CC  SPECIMEN:  Source of Specimen:  placenta  DISPOSITION OF SPECIMEN:  PATHOLOGY  COUNTS:  YES  TOURNIQUET:  * No tourniquets in log *  DICTATION: .Other Dictation: Dictation Number [id number]  PLAN OF CARE: Admit to inpatient   PATIENT DISPOSITION:  PACU - hemodynamically stable.   Delay start of Pharmacological VTE agent (>24hrs) due to surgical blood loss or risk of bleeding:  no

## 2011-06-25 NOTE — Progress Notes (Signed)
CSW attempted to see Pt due to h/o depression in past and now off meds. Per RN, Pt resting and needs to rest. Will get week day CSW to see in am.  Vennie Homans [facility] 06/25/2011 3:11 PM

## 2011-06-25 NOTE — Op Note (Signed)
NAMEHAILEY, [name] [name]                ACCOUNT NO.:  [id number]  MEDICAL RECORD NO.:  [id number]  LOCATION:  [facility]                          FACILITY:  [facility]  PHYSICIAN:  Maxie Better, M.D.DATE OF BIRTH:  01/12/1981  DATE OF PROCEDURE:  06/25/2011 DATE OF DISCHARGE:                              OPERATIVE REPORT   PREOPERATIVE DIAGNOSES: 1. Arrest of descent. 2. Term gestation. 3. Class A2 gestational diabetes.  PROCEDURES: 1. Primary cesarean section, Kerr hysterotomy.  POSTOPERATIVE DIAGNOSES: 1. Arrest of descent, left occiput transverse position. 2. Term gestation. 3. Class A2 gestational diabetes.  ANESTHESIA:  Epidural.  SURGEON:  Maxie Better, MD.  ASSISTANT:  Arlan Organ, CNM.  PROCEDURE IN DETAIL:  Under adequate epidural anesthesia, the patient was placed in the supine position with a left lateral tilt.  The patient already had an indwelling Foley catheter, which was draining some blood- tinged urine.  The patient was sterilely prepped and draped in usual fashion.  0.25% Marcaine was injected at the planned Pfannenstiel skin incision site.  Pfannenstiel skin incision was then made, carried down to the rectus fascia.  Rectus fascia was opened transversely.  The rectus fascia was then bluntly and sharply dissected off the rectus muscle in superior and inferior fashion.  The rectus muscle was split in midline.  The parietal peritoneum was entered bluntly and extended.  The vesicouterine peritoneum was opened transversely.  The bladder was bluntly dissected off the lower uterine segment and displaced with a bladder retractor.  A curvilinear low transverse uterine incision was then made and extended with bandage scissors.  Subsequent delivery of a live female from the left occiput transverse position was accomplished. The baby was bulb suctioned in the abdomen and cord was clamped, cut. The baby was transferred to the awaiting pediatrician who assigned Apgars 8  and 9 at 1 and 5 minutes.  The placenta was spontaneous, intact, sent to pathology due to meconium fluid.  Uterine cavity was cleaned of debris.  Uterine incision had no extension, was closed in 2 layers, the first layer with 0 Monocryl in running locked stitch. Second layer was imbricated using 0 Monocryl suture.  Normal tubes and ovaries were noted bilaterally.  The paracolic gutters were cleaned of debris after the abdomen was irrigated and suctioned.  The parietal peritoneum was then closed with 2-0 Vicryl.  The rectus fascia was closed with 0 Vicryl x2.  The subcutaneous area was irrigated and small bleeders cauterized.  2-0 plain interrupted sutures x2 layers was placed and the skin approximated with Ethicon staples.  SPECIMENS:  Placenta sent to Pathology.  ESTIMATED BLOOD LOSS:  800 mL.  INTRAOPERATIVE FLUID:  1100 mL.  URINE:  Output 100 mL clearing at the end of the case.  Sponge and instrument count x2 was correct.  COMPLICATIONS:  None.  Weight of the baby was 8 pounds and 15 ounces.  The patient tolerated the procedure well was transferred to recovery in stable condition.     Maxie Better, M.D.     [place]/MEDQ  D:  06/25/2011  T:  06/25/2011  Job:  [id number]

## 2011-06-25 NOTE — Transfer of Care (Signed)
Immediate Anesthesia Transfer of Care Note  Patient: [name] [name]  Procedure(s) Performed:  CESAREAN SECTION - primary of baby girl  at  72 APGAR 8/9  Patient Location: PACU  Anesthesia Type: Epidural  Level of Consciousness: awake, alert  and oriented  Airway & Oxygen Therapy: Patient Spontanous Breathing  Post-op Assessment: Report given to PACU RN and Post -op Vital signs reviewed and stable  Post vital signs: stable  Complications: No apparent anesthesia complications

## 2011-06-25 NOTE — Addendum Note (Signed)
Addendum  created 06/25/11 0741 by Fanny Dance   Modules edited:Notes Section

## 2011-06-25 NOTE — Anesthesia Postprocedure Evaluation (Signed)
  Anesthesia Post-op Note  Patient: [name] [name]  Procedure(s) Performed:  CESAREAN SECTION - primary of baby girl  at  0245 APGAR 8/9  Patient Location: Mother/Baby  Anesthesia Type: Epidural  Level of Consciousness: awake, alert  and oriented  Airway and Oxygen Therapy: Patient Spontanous Breathing  Post-op Pain: mild  Post-op Assessment: Patient's Cardiovascular Status Stable and Respiratory Function Stable  Post-op Vital Signs: stable  Complications: No apparent anesthesia complications 

## 2011-06-25 NOTE — Consult Note (Signed)
ANTIBIOTIC CONSULT NOTE - INITIAL  Pharmacy Consult for Gentamicin Indication: Surgical prophylaxis  Allergies  Allergen Reactions  . Ibuprofen Other (See Comments)    stuffiness  . Food     Nuts and Yeast  . Penicillins     REACTION: ? hives, tonue/lip swelling    Patient Measurements: Height: 5\' 6"  (167.6 cm) Weight: 226 lb (102.513 kg) IBW/kg (Calculated) : 59.3  Adjusted Body Weight: 72kg Vital Signs: Temp: 98.8 F (37.1 C) (10/28 0500) Temp src: Axillary (10/28 0200) BP: 118/74 mmHg (10/28 0500) Pulse Rate: 86  (10/28 0500) Intake/Output from previous day: 10/27 0701 - 10/28 0700 In: 1100 [I.V.:1100] Out: 950 [Urine:950] Intake/Output from this shift: Total I/O In: 1100 [I.V.:1100] Out: 950 [Urine:950]  Labs:  [facility] 06/23/11 2055  WBC 11.7*  HGB 12.2  PLT 131*  LABCREA --  CREATININE --   Estimated Creatinine Clearance: 125.5 ml/min (by C-G formula based on Cr of 0.6). Microbiology: Recent Results (from the past 720 hour(s))  STREP B DNA PROBE     Status: Normal      Component Value Range Status Comment   Group B Strep Ag Positive        Medical History: Past Medical History  Diagnosis Date  . Headache     migraine  . Depression     weaned off meds during pregnancy  . History of condyloma acuminatum   . History of chicken pox   . Anxiety   . Gestational diabetes     glyburide     Medications: Clindamycin 900mg  IV q8h  Assessment: Pt is a 30 YO G2P0010 at term who requires surgical prophylaxis for C-section due to arrest of dilation.  Goal of Therapy: Therapeutic gentamicin levels: est peaks 6-8 mcg/ml; troughs <1.0 mcg/ml.   Plan: Gentamicin 190 mg IV with clindamycin 900 mg x one dose pre-operatively. Continue gentamicin 180 mg with 900 mg clindamycin x 2 post-operative doses.   Arelia Sneddon 06/25/2011,6:01 AM

## 2011-06-25 NOTE — Addendum Note (Signed)
Addendum  created 06/25/11 0815 by Tyrone Apple. [employer] edited:PRL Based Order Sets

## 2011-06-25 NOTE — Anesthesia Postprocedure Evaluation (Signed)
Anesthesia Post Note  Patient: [name] [name]  Procedure(s) Performed:  CESAREAN SECTION - primary of baby girl  at  29 APGAR 8/9  Anesthesia type: Epidural  Patient location: PACU  Post pain: Pain level controlled  Post assessment: Post-op Vital signs reviewed  Last Vitals:  Filed Vitals:   06/25/11 0500  BP: 118/74  Pulse: 86  Temp: 37.1 C  Resp: 18    Post vital signs: stable  Level of consciousness: awake  Complications: No apparent anesthesia complications

## 2011-06-26 LAB — CBC
HCT: 33.9 % — ABNORMAL LOW (ref 36.0–46.0)
MCH: 30.1 pg (ref 26.0–34.0)
MCV: 88.7 fL (ref 78.0–100.0)
RBC: 3.82 MIL/uL — ABNORMAL LOW (ref 3.87–5.11)
WBC: 15.2 10*3/uL — ABNORMAL HIGH (ref 4.0–10.5)

## 2011-06-26 NOTE — Addendum Note (Signed)
Addendum  created 06/26/11 1610 by Fanny Dance   Modules edited:Notes Section

## 2011-06-26 NOTE — Progress Notes (Signed)
  S:         Reports feeling ok - really sore / some residual itching as well             Tolerating po intake / no nausea / no vomiting / + flatus / no BM yet             Bleeding is light             Pain controlled with:                       prescription NSAID's including motrin and narcotic analgesics including percocet             Up ad lib / ambulatory  Newborn breast feeding  / female newborn   O:  A & O x 3 NAD / pleasant - sitting up in bed             VS: Blood pressure 109/72, pulse 89, temperature 98.4 F (36.9 C), temperature source Oral, resp. rate 20, height 5\' 6"  (1.676 m), weight 102.513 kg (226 lb), last menstrual period 09/21/2010, SpO2 95.00%, unknown if currently breastfeeding.  LABS:  Lab Results  Component Value Date   WBC 15.2* 06/26/2011   HGB 11.5* 06/26/2011   HCT 33.9* 06/26/2011   MCV 88.7 06/26/2011   PLT 153 06/26/2011     I&O: I/O last 3 completed shifts: In: 3765.5 [P.O.:1680; I.V.:1975; IV Piggyback:110.5] Out: 4650 [Urine:4650]  Negative Net balance      Lungs: Clear and unlabored  Heart: regular rate and rhythm / no mumurs  Abdomen: soft, non-tender, non-distended, active bowel sounds             Fundus: firm, non-tender, Ueven             Dressing intact / no drainage              Perineum: no edema  Lochia: light  Extremities: trace edema, no calf pain or tenderness  A:        POD # 1 S/P cesarean section            Stable status  P:        Routine postoperative care              Consider early discharge tomorrow     [name] [name] 06/26/2011, 9:35 AM

## 2011-06-26 NOTE — Anesthesia Postprocedure Evaluation (Signed)
  Anesthesia Post-op Note  Patient: [name] [name]  Procedure(s) Performed:  CESAREAN SECTION - primary of baby girl  at  35 APGAR 8/9  Patient Location: Mother/Baby  Anesthesia Type: Epidural  Level of Consciousness: awake, alert  and oriented  Airway and Oxygen Therapy: Patient Spontanous Breathing  Post-op Pain: mild  Post-op Assessment: Patient's Cardiovascular Status Stable and Respiratory Function Stable  Post-op Vital Signs: stable  Complications: No apparent anesthesia complications

## 2011-06-27 ENCOUNTER — Encounter (HOSPITAL_COMMUNITY): Payer: Self-pay | Admitting: Obstetrics and Gynecology

## 2011-06-27 NOTE — Progress Notes (Signed)
  S:         Reports feeling exhausted - tearful and crying - "just so tired" / no sleep last night             Tolerating po intake / no nausea / no vomiting / + flatus / no BM yet             Abdominal soreness and stiffness / incisional pain - pain med makes her even more sleepy             Bleeding is light             Pain med: prescription NSAID's including motrin and narcotic analgesics including percocet             Up ad lib / ambulatory with encouragement  - "so painful to move"  Newborn breast feeding  / female  O: A & O x 3 tearful, crying, pale and fatigued             VS: Blood pressure 114/75, pulse 76, temperature 97.8 F (36.6 C), temperature source Oral, resp. rate 18, height 5\' 6"  (1.676 m), weight 102.513 kg (226 lb), last menstrual period 09/21/2010, SpO2 95.00%, unknown if currently breastfeeding.  LABS:  Lab Results  Component Value Date   WBC 15.2* 06/26/2011   HGB 11.5* 06/26/2011   HCT 33.9* 06/26/2011   MCV 88.7 06/26/2011   PLT 153 06/26/2011      Lungs: Clear and unlabored  Heart: regular rate and rhythm / no mumurs  Abdomen: soft, non-tender, non-distended, active bowel sounds             Fundus: firm, non-tender, U-even             Dressing OFF              Incision:  approximated with staples / no erythema / no ecchymosis / no drainage  Perineum: no edema  Lochia:light  Extremities: 1+ dependent edema, no calf pain or tenderness  A:        POD # 2 S/P cesarean section             Pain control            Exhaustion and fatigue            Breastfeeding   P:        Routine postoperative care              Encouraged - baby to nursery or family member in room to care for baby to allow rest/sleep             Take 2 percocet to control pain and assist in rest / sleep for 3-4 hours undisturbed             Shower and ambulate after nap              Patient and family agrees - newborn to nursery / mom to sleep now    Akira Adelsberger 06/27/2011, 10:06  AM

## 2011-06-28 MED ORDER — OXYCODONE-ACETAMINOPHEN 5-325 MG PO TABS: 1.0000 | ORAL_TABLET | ORAL | Status: AC | PRN

## 2011-06-28 MED ORDER — ZOLPIDEM TARTRATE 10 MG PO TABS: 10.0000 mg | ORAL_TABLET | Freq: Every evening | ORAL | Status: DC | PRN

## 2011-06-28 NOTE — Discharge Summary (Signed)
Obstetric Discharge Summary Reason for Admission: induction of labor G2P0010 MWF now @ 39 2/[redacted] wk gestation presenting for induction of labor due to Class A2 GDM  Prenatal Procedures: ultrasound Intrapartum Procedures: cesarean: low cervical, transverse and performed d/t failure to progress Postpartum Procedures: none FBS on POD #1 76  Complications-Operative and Postpartum: none Allergic to ibuprofen, therefore, only able to take percocet for pain.    Hemoglobin  Date Value Range Status  06/26/2011 11.5* 12.0-15.0 (g/dL) Final     HCT  Date Value Range Status  06/26/2011 33.9* 36.0-46.0 (%) Final    Discharge Diagnoses: Term Pregnancy-delivered, Failed induction and C/S performed on 06/25/2011  Discharge Information: Date: 06/28/2011 Activity: pelvic rest Diet: routine Medications: Percocet and Ambien Condition: stable Instructions: refer to practice specific booklet Discharge to: home Follow-up Information    Follow up with COUSINS,SHERONETTE A, MD in 6 weeks.   Contact information:   [address] [place] [address] [phone]          Newborn Data: Live born female on 06/25/2011 Birth Weight: 8 lb 15 oz (4055 g) APGAR: 8, 9  Home with mother.  Evertt Chouinard K 06/28/2011, 9:54 AM

## 2011-06-28 NOTE — Progress Notes (Signed)
  POD # 3  Subjective: Pt reports feeling well and eager for d/c home; still tired, and very little rest yesterday/ Pain controlled with narcotic analgesics including percocet Tolerating po/Voiding without problems/ No n/v/Flatus pos Activity: ad lib Bleeding is spotting Newborn info:  Information for the patient's newborn:  Tasheika, Kitzmiller [[id number]]  female  Feeding: breast   Objective: VS: Blood pressure 134/81, pulse 84, temperature 98.4 F (36.9 C), temperature source Oral, resp. rate 20    Physical Exam:  General: alert, cooperative and no distress CV: Regular rate and rhythm Resp: clear Abdomen: soft, nontender, normal bowel sounds Incision: healing well, no drainage, no erythema, well approximated Uterine Fundus: firm, below umbilicus, nontender Lochia: minimal Ext: edema trace and Homans sign is negative, no sign of DVT    A/P: POD # 3/ G2P1011 Hx GDM; stable; FBS POD1 = 74 Doing well and stable for discharge home RX: Percocet 5/325 1-2 po Q4hrs prn pain #40, no refill Staple removal prior to d/c home, apply benzoin and steri strips Rt pp visit in 6 wks

## 2011-06-28 NOTE — Progress Notes (Signed)
Patient was referred for history of depression/anxiety.  * Referral screened out by Clinical Social Worker because none of the following criteria appear to apply:  ~ History of anxiety/depression during this pregnancy, or of post-partum depression.  ~ Diagnosis of anxiety and/or depression within last 3 years  ~ History of depression due to pregnancy loss/loss of child  OR  * Patient's symptoms currently being treated with medication and/or therapy.  Please contact the Clinical Social Worker if needs arise, or by the patient's request.  The pt is being followed by psychiatrist, Dr. Kaur. She plans to continue post discharge, as per pt.   

## 2012-02-20 ENCOUNTER — Other Ambulatory Visit: Payer: Self-pay

## 2012-12-16 ENCOUNTER — Emergency Department (HOSPITAL_COMMUNITY)
Admission: EM | Admit: 2012-12-16 | Discharge: 2012-12-16 | Disposition: A | Discharge status: Home / Self Care | Payer: BC Managed Care – PPO | Attending: Emergency Medicine | Admitting: Emergency Medicine

## 2012-12-16 ENCOUNTER — Encounter (HOSPITAL_COMMUNITY): Payer: Self-pay | Admitting: *Deleted

## 2012-12-16 DIAGNOSIS — R110 Nausea: Secondary | ICD-10-CM | POA: Insufficient documentation

## 2012-12-16 DIAGNOSIS — Z8619 Personal history of other infectious and parasitic diseases: Secondary | ICD-10-CM | POA: Insufficient documentation

## 2012-12-16 DIAGNOSIS — Z87891 Personal history of nicotine dependence: Secondary | ICD-10-CM | POA: Insufficient documentation

## 2012-12-16 DIAGNOSIS — R42 Dizziness and giddiness: Secondary | ICD-10-CM | POA: Insufficient documentation

## 2012-12-16 DIAGNOSIS — F329 Major depressive disorder, single episode, unspecified: Secondary | ICD-10-CM | POA: Insufficient documentation

## 2012-12-16 DIAGNOSIS — M542 Cervicalgia: Secondary | ICD-10-CM | POA: Insufficient documentation

## 2012-12-16 DIAGNOSIS — Z8679 Personal history of other diseases of the circulatory system: Secondary | ICD-10-CM | POA: Insufficient documentation

## 2012-12-16 DIAGNOSIS — Z8632 Personal history of gestational diabetes: Secondary | ICD-10-CM | POA: Insufficient documentation

## 2012-12-16 DIAGNOSIS — M62838 Other muscle spasm: Secondary | ICD-10-CM

## 2012-12-16 DIAGNOSIS — Z79899 Other long term (current) drug therapy: Secondary | ICD-10-CM | POA: Insufficient documentation

## 2012-12-16 DIAGNOSIS — F3289 Other specified depressive episodes: Secondary | ICD-10-CM | POA: Insufficient documentation

## 2012-12-16 DIAGNOSIS — M25519 Pain in unspecified shoulder: Secondary | ICD-10-CM | POA: Insufficient documentation

## 2012-12-16 DIAGNOSIS — F411 Generalized anxiety disorder: Secondary | ICD-10-CM | POA: Insufficient documentation

## 2012-12-16 MED ORDER — DIAZEPAM 5 MG PO TABS
5.0000 mg | ORAL_TABLET | Freq: Once | ORAL | Status: AC
  Administered 2012-12-16: 5 mg via ORAL
  Filled 2012-12-16: qty 1

## 2012-12-16 MED ORDER — OXYCODONE-ACETAMINOPHEN 5-325 MG PO TABS
1.0000 | ORAL_TABLET | Freq: Once | ORAL | Status: AC
  Administered 2012-12-16: 1 via ORAL
  Filled 2012-12-16: qty 1

## 2012-12-16 NOTE — ED Notes (Signed)
The pt has had a  Pain in the back of her head and neck since this am.  Tylenol is not helping.  Dizziness nausea no vomiting

## 2012-12-16 NOTE — ED Provider Notes (Signed)
History     CSN: [id number]  Arrival date & time 12/16/12  0111   First MD Initiated Contact with Patient 12/16/12 0335      Chief Complaint  Patient presents with  . Headache    (Consider location/radiation/quality/duration/timing/severity/associated sxs/prior treatment) HPI 32 yo female presents to the ER from home with complaint of neck, shoulder and posterior head pain.  Pain present all day, worsening this evening as she was getting ready for bed.  No trauma to the area, no recent manipulation or yoga.  Pt has had dizziness and mild nausea, but no vomiting, no fevers.  No nuchal rigidity or neck stiffness.  Pain worse with sitting, lying, or with movement of head, palpation of neck, left shoulder.  Pt returned from beach today, rode in car about 5 hours.  Pt has h/o migraine, but this pain not like her migraines.  She has tried tylenol without improvement.  Sxs were gradual in onset.  No weakness, numbness or focal neuro complaint.  Past Medical History  Diagnosis Date  . Headache     migraine  . Depression     weaned off meds during pregnancy  . History of condyloma acuminatum   . History of chicken pox   . Anxiety   . Gestational diabetes     glyburide     Past Surgical History  Procedure Laterality Date  . Knee surgery      L meniscus tear  . Cesarean section  06/25/2011    Procedure: CESAREAN SECTION;  Surgeon: Serita Kyle, MD;  Location: [facility];  Service: Gynecology;  Laterality: N/A;  primary of baby girl  at  82 APGAR 8/9    Family History  Problem Relation Age of Onset  . Anesthesia problems Neg Hx   . Hypotension Neg Hx   . Malignant hyperthermia Neg Hx   . Pseudochol deficiency Neg Hx     History  Substance Use Topics  . Smoking status: Former Smoker    Quit date: 10/18/2010  . Smokeless tobacco: Never Used  . Alcohol Use: No    OB History   Grav Para Term Preterm Abortions TAB SAB Ect Mult Living   2 1 1  1 1    1       Review of  Systems  All other systems reviewed and are negative.    Allergies  Ibuprofen; Food; and Penicillins  Home Medications   Current Outpatient Rx  Name  Route  Sig  Dispense  Refill  . acetaminophen (TYLENOL) 500 MG tablet   Oral   Take 1,000 mg by mouth every 6 (six) hours as needed for pain.         Marland Kitchen ALPRAZolam (XANAX) 0.5 MG tablet   Oral   Take 0.5 mg by mouth 4 (four) times daily as needed for anxiety.         Marland Kitchen amphetamine-dextroamphetamine (ADDERALL) 20 MG tablet   Oral   Take 20 mg by mouth 4 (four) times daily.         . cetirizine (ZYRTEC) 10 MG tablet   Oral   Take 10 mg by mouth daily.         Marland Kitchen levonorgestrel-ethinyl estradiol (AVIANE,ALESSE,LESSINA) 0.1-20 MG-MCG tablet   Oral   Take 1 tablet by mouth daily.         Marland Kitchen zolpidem (AMBIEN) 10 MG tablet   Oral   Take 5 mg by mouth at bedtime as needed for sleep.  BP 136/78  Temp(Src) 98.5 F (36.9 C) (Axillary)  Resp 20  SpO2 100%  LMP 12/16/2012  Physical Exam  Nursing note and vitals reviewed. Constitutional: She is oriented to person, place, and time. She appears well-developed and well-nourished.  HENT:  Head: Normocephalic and atraumatic.  Right Ear: External ear normal.  Left Ear: External ear normal.  Nose: Nose normal.  Mouth/Throat: Oropharynx is clear and moist.  Eyes: Conjunctivae and EOM are normal. Pupils are equal, round, and reactive to light.  Neck: Normal range of motion. Neck supple. No JVD present. No tracheal deviation present. No thyromegaly present.  TTP along left paraspinal muscles, left trapezius.  Pain reproducible with palpation and with ROM of neck.  No bruit noted.    Cardiovascular: Normal rate, regular rhythm, normal heart sounds and intact distal pulses.  Exam reveals no gallop and no friction rub.   No murmur heard. Pulmonary/Chest: Effort normal and breath sounds normal. No stridor. No respiratory distress. She has no wheezes. She has no rales.  She exhibits no tenderness.  Abdominal: Soft. Bowel sounds are normal. She exhibits no distension and no mass. There is no tenderness. There is no rebound and no guarding.  Musculoskeletal: Normal range of motion. She exhibits no edema and no tenderness.  Lymphadenopathy:    She has no cervical adenopathy.  Neurological: She is alert and oriented to person, place, and time. She has normal reflexes. No cranial nerve deficit. She exhibits normal muscle tone. Coordination normal.  Skin: Skin is warm and dry. No rash noted. No erythema. No pallor.  Psychiatric: She has a normal mood and affect. Her behavior is normal. Judgment and thought content normal.    ED Course  Procedures (including critical care time)  Labs Reviewed - No data to display No results found.   1. Neck pain   2. Cervical paraspinal muscle spasm       MDM  32 yo female with posterior neck, head pain.  Pain reproducible with palpation and movement of neck.  No trauma, manipulation.  Do not feel sxs are due to vertebral dissection.  No rash, do not feel sxs are due to shingles.  Neuro exam normal, do not feel sxs due to stroke.  Will treat with heat therapy, percocet/valium x 1 here.  Pt comfortable with plan, has good outpatient f/u.        Olivia Mackie, MD 12/16/12 [phone]

## 2012-12-16 NOTE — ED Notes (Signed)
The pain is worse with sitting or standing.  Describes it as a  Throbbing pain

## 2013-07-03 ENCOUNTER — Other Ambulatory Visit: Payer: Self-pay

## 2014-06-29 ENCOUNTER — Encounter (HOSPITAL_COMMUNITY): Payer: Self-pay | Admitting: *Deleted

## 2014-09-30 LAB — OB RESULTS CONSOLE RUBELLA ANTIBODY, IGM: Rubella: IMMUNE

## 2014-09-30 LAB — OB RESULTS CONSOLE HIV ANTIBODY (ROUTINE TESTING): HIV: NONREACTIVE

## 2014-09-30 LAB — OB RESULTS CONSOLE ABO/RH: RH Type: POSITIVE

## 2014-09-30 LAB — OB RESULTS CONSOLE HEPATITIS B SURFACE ANTIGEN: Hepatitis B Surface Ag: NEGATIVE

## 2014-09-30 LAB — OB RESULTS CONSOLE RPR: RPR: NONREACTIVE

## 2014-09-30 LAB — OB RESULTS CONSOLE ANTIBODY SCREEN: ANTIBODY SCREEN: NEGATIVE

## 2014-10-14 LAB — OB RESULTS CONSOLE GC/CHLAMYDIA
Chlamydia: NEGATIVE
Gonorrhea: NEGATIVE

## 2014-10-14 LAB — HM PAP SMEAR: HM Pap smear: NEGATIVE

## 2014-12-02 ENCOUNTER — Encounter: Payer: 59 | Attending: Obstetrics and Gynecology

## 2014-12-02 VITALS — Ht 66.0 in | Wt 194.0 lb

## 2014-12-02 DIAGNOSIS — O24419 Gestational diabetes mellitus in pregnancy, unspecified control: Secondary | ICD-10-CM | POA: Diagnosis not present

## 2014-12-02 DIAGNOSIS — Z713 Dietary counseling and surveillance: Secondary | ICD-10-CM | POA: Insufficient documentation

## 2014-12-07 NOTE — Progress Notes (Signed)
  Patient was seen on 12/02/14 for Gestational Diabetes self-management . The following learning objectives were met by the patient :   States the definition of Gestational Diabetes  States why dietary management is important in controlling blood glucose  Describes the effects of carbohydrates on blood glucose levels  Demonstrates ability to create a balanced meal plan  Demonstrates carbohydrate counting   States when to check blood glucose levels  Demonstrates proper blood glucose monitoring techniques  States the effect of stress and exercise on blood glucose levels  States the importance of limiting caffeine and abstaining from alcohol and smoking  Plan:  Aim for 2 Carb Choices per meal (30 grams) +/- 1 either way for breakfast Aim for 3 Carb Choices per meal (45 grams) +/- 1 either way from lunch and dinner Aim for 1-2 Carbs per snack Begin reading food labels for Total Carbohydrate and sugar grams of foods Consider  increasing your activity level by walking daily as tolerated Begin checking BG before breakfast and 2 hours after first bit of breakfast, lunch and dinner after  as directed by MD  Take medication  as directed by MD  Blood glucose monitor given: None, already testing per MD  Patient instructed to monitor glucose levels: FBS: 60 - <90 2 hour: <120  Patient received the following handouts:  Nutrition Diabetes and Pregnancy  Carbohydrate Counting List  Meal Planning worksheet  Patient will be seen for follow-up as needed.

## 2015-04-07 LAB — OB RESULTS CONSOLE GBS: STREP GROUP B AG: POSITIVE

## 2015-04-12 ENCOUNTER — Other Ambulatory Visit: Payer: Self-pay | Admitting: Obstetrics and Gynecology

## 2015-05-04 ENCOUNTER — Inpatient Hospital Stay (HOSPITAL_COMMUNITY): Payer: 59 | Admitting: Anesthesiology

## 2015-05-04 ENCOUNTER — Inpatient Hospital Stay (HOSPITAL_COMMUNITY)
Admission: AD | Admit: 2015-05-04 | Discharge: 2015-05-07 | DRG: 765 | Disposition: A | Discharge status: Home / Self Care | Payer: 59 | Source: Ambulatory Visit | Attending: Obstetrics | Admitting: Obstetrics

## 2015-05-04 ENCOUNTER — Encounter (HOSPITAL_COMMUNITY): Payer: Self-pay | Admitting: *Deleted

## 2015-05-04 ENCOUNTER — Encounter (HOSPITAL_COMMUNITY)
Admission: AD | Disposition: A | Discharge status: Home / Self Care | Payer: Self-pay | Source: Ambulatory Visit | Attending: Obstetrics

## 2015-05-04 DIAGNOSIS — D509 Iron deficiency anemia, unspecified: Secondary | ICD-10-CM | POA: Diagnosis present

## 2015-05-04 DIAGNOSIS — O99354 Diseases of the nervous system complicating childbirth: Secondary | ICD-10-CM | POA: Diagnosis present

## 2015-05-04 DIAGNOSIS — O24410 Gestational diabetes mellitus in pregnancy, diet controlled: Secondary | ICD-10-CM | POA: Diagnosis present

## 2015-05-04 DIAGNOSIS — Z3A39 39 weeks gestation of pregnancy: Secondary | ICD-10-CM | POA: Diagnosis present

## 2015-05-04 DIAGNOSIS — O9832 Other infections with a predominantly sexual mode of transmission complicating childbirth: Secondary | ICD-10-CM | POA: Diagnosis present

## 2015-05-04 DIAGNOSIS — O133 Gestational [pregnancy-induced] hypertension without significant proteinuria, third trimester: Secondary | ICD-10-CM | POA: Diagnosis present

## 2015-05-04 DIAGNOSIS — Z87891 Personal history of nicotine dependence: Secondary | ICD-10-CM | POA: Diagnosis not present

## 2015-05-04 DIAGNOSIS — Z880 Allergy status to penicillin: Secondary | ICD-10-CM

## 2015-05-04 DIAGNOSIS — F419 Anxiety disorder, unspecified: Secondary | ICD-10-CM | POA: Diagnosis present

## 2015-05-04 DIAGNOSIS — K660 Peritoneal adhesions (postprocedural) (postinfection): Secondary | ICD-10-CM | POA: Diagnosis present

## 2015-05-04 DIAGNOSIS — O99824 Streptococcus B carrier state complicating childbirth: Secondary | ICD-10-CM | POA: Diagnosis present

## 2015-05-04 DIAGNOSIS — O99344 Other mental disorders complicating childbirth: Secondary | ICD-10-CM | POA: Diagnosis present

## 2015-05-04 DIAGNOSIS — O3421 Maternal care for scar from previous cesarean delivery: Secondary | ICD-10-CM | POA: Diagnosis present

## 2015-05-04 DIAGNOSIS — G43909 Migraine, unspecified, not intractable, without status migrainosus: Secondary | ICD-10-CM | POA: Diagnosis present

## 2015-05-04 DIAGNOSIS — O9852 Other viral diseases complicating childbirth: Secondary | ICD-10-CM | POA: Diagnosis present

## 2015-05-04 DIAGNOSIS — O9902 Anemia complicating childbirth: Secondary | ICD-10-CM | POA: Diagnosis present

## 2015-05-04 DIAGNOSIS — Z6837 Body mass index (BMI) 37.0-37.9, adult: Secondary | ICD-10-CM

## 2015-05-04 DIAGNOSIS — D62 Acute posthemorrhagic anemia: Secondary | ICD-10-CM | POA: Diagnosis present

## 2015-05-04 DIAGNOSIS — O99214 Obesity complicating childbirth: Secondary | ICD-10-CM | POA: Diagnosis present

## 2015-05-04 DIAGNOSIS — F329 Major depressive disorder, single episode, unspecified: Secondary | ICD-10-CM | POA: Diagnosis present

## 2015-05-04 DIAGNOSIS — O24420 Gestational diabetes mellitus in childbirth, diet controlled: Secondary | ICD-10-CM | POA: Diagnosis present

## 2015-05-04 DIAGNOSIS — E669 Obesity, unspecified: Secondary | ICD-10-CM | POA: Diagnosis present

## 2015-05-04 DIAGNOSIS — IMO0001 Reserved for inherently not codable concepts without codable children: Secondary | ICD-10-CM | POA: Diagnosis present

## 2015-05-04 LAB — COMPREHENSIVE METABOLIC PANEL
ALT: 13 U/L — AB (ref 14–54)
AST: 19 U/L (ref 15–41)
Albumin: 2.8 g/dL — ABNORMAL LOW (ref 3.5–5.0)
Alkaline Phosphatase: 189 U/L — ABNORMAL HIGH (ref 38–126)
Anion gap: 5 (ref 5–15)
BUN: 8 mg/dL (ref 6–20)
CHLORIDE: 112 mmol/L — AB (ref 101–111)
CO2: 19 mmol/L — AB (ref 22–32)
CREATININE: 0.55 mg/dL (ref 0.44–1.00)
Calcium: 8.3 mg/dL — ABNORMAL LOW (ref 8.9–10.3)
GFR calc Af Amer: >60 mL/min (ref >60)
Glucose, Bld: 86 mg/dL (ref 65–99)
Potassium: 4.1 mmol/L (ref 3.5–5.1)
Sodium: 136 mmol/L (ref 135–145)
Total Bilirubin: 0.1 mg/dL — ABNORMAL LOW (ref 0.3–1.2)
Total Protein: 6 g/dL — ABNORMAL LOW (ref 6.5–8.1)

## 2015-05-04 LAB — URINALYSIS, ROUTINE W REFLEX MICROSCOPIC
BILIRUBIN URINE: NEGATIVE
GLUCOSE, UA: NEGATIVE mg/dL
HGB URINE DIPSTICK: NEGATIVE
Ketones, ur: NEGATIVE mg/dL
Leukocytes, UA: NEGATIVE
Nitrite: NEGATIVE
PROTEIN: NEGATIVE mg/dL
Specific Gravity, Urine: 1.025 (ref 1.005–1.030)
UROBILINOGEN UA: 0.2 mg/dL (ref 0.0–1.0)
pH: 6.5 (ref 5.0–8.0)

## 2015-05-04 LAB — CBC
HEMATOCRIT: 28.8 % — AB (ref 36.0–46.0)
Hemoglobin: 9.4 g/dL — ABNORMAL LOW (ref 12.0–15.0)
MCH: 26.5 pg (ref 26.0–34.0)
MCHC: 32.6 g/dL (ref 30.0–36.0)
MCV: 81.1 fL (ref 78.0–100.0)
Platelets: 150 10*3/uL (ref 150–400)
RBC: 3.55 MIL/uL — ABNORMAL LOW (ref 3.87–5.11)
RDW: 14.3 % (ref 11.5–15.5)
WBC: 13.2 10*3/uL — ABNORMAL HIGH (ref 4.0–10.5)

## 2015-05-04 LAB — POCT FERN TEST

## 2015-05-04 LAB — TYPE AND SCREEN
ABO/RH(D): AB POS
Antibody Screen: NEGATIVE

## 2015-05-04 LAB — URIC ACID: URIC ACID, SERUM: 5.3 mg/dL (ref 2.3–6.6)

## 2015-05-04 LAB — GLUCOSE, CAPILLARY
GLUCOSE-CAPILLARY: 103 mg/dL — AB (ref 65–99)
Glucose-Capillary: 84 mg/dL (ref 65–99)

## 2015-05-04 LAB — ABO/RH: ABO/RH(D): AB POS

## 2015-05-04 LAB — RPR: RPR Ser Ql: NONREACTIVE

## 2015-05-04 SURGERY — Surgical Case
Anesthesia: Spinal | Site: Abdomen

## 2015-05-04 MED ORDER — OXYCODONE-ACETAMINOPHEN 5-325 MG PO TABS
2.0000 | ORAL_TABLET | ORAL | Status: DC | PRN
  Administered 2015-05-04 (×2): 2 via ORAL
  Filled 2015-05-04 (×2): qty 2

## 2015-05-04 MED ORDER — ACETAMINOPHEN 500 MG PO TABS
1000.0000 mg | ORAL_TABLET | Freq: Four times a day (QID) | ORAL | Status: DC
  Administered 2015-05-04 (×2): 1000 mg via ORAL
  Filled 2015-05-04 (×3): qty 2

## 2015-05-04 MED ORDER — MORPHINE SULFATE (PF) 0.5 MG/ML IJ SOLN
INTRAMUSCULAR | Status: DC | PRN
  Administered 2015-05-04: .1 mg via INTRATHECAL

## 2015-05-04 MED ORDER — OXYTOCIN 40 UNITS IN LACTATED RINGERS INFUSION - SIMPLE MED: 62.5000 mL/h | INTRAVENOUS | Status: AC

## 2015-05-04 MED ORDER — TETANUS-DIPHTH-ACELL PERTUSSIS 5-2.5-18.5 LF-MCG/0.5 IM SUSP
0.5000 mL | Freq: Once | INTRAMUSCULAR | Status: DC
Start: 2015-05-05 — End: 2015-05-04

## 2015-05-04 MED ORDER — NALBUPHINE HCL 10 MG/ML IJ SOLN
5.0000 mg | Freq: Once | INTRAMUSCULAR | Status: AC | PRN
  Administered 2015-05-04: 5 mg via SUBCUTANEOUS

## 2015-05-04 MED ORDER — DIPHENHYDRAMINE HCL 25 MG PO CAPS: 25.0000 mg | ORAL_CAPSULE | ORAL | Status: DC | PRN

## 2015-05-04 MED ORDER — LANOLIN HYDROUS EX OINT: 1.0000 "application " | TOPICAL_OINTMENT | CUTANEOUS | Status: DC | PRN

## 2015-05-04 MED ORDER — NALBUPHINE HCL 10 MG/ML IJ SOLN: 5.0000 mg | Freq: Once | INTRAMUSCULAR | Status: AC | PRN

## 2015-05-04 MED ORDER — PRENATAL MULTIVITAMIN CH
1.0000 | ORAL_TABLET | Freq: Every day | ORAL | Status: DC
  Administered 2015-05-04 – 2015-05-07 (×4): 1 via ORAL
  Filled 2015-05-04 (×4): qty 1

## 2015-05-04 MED ORDER — SIMETHICONE 80 MG PO CHEW
80.0000 mg | CHEWABLE_TABLET | ORAL | Status: DC
  Administered 2015-05-05 – 2015-05-07 (×3): 80 mg via ORAL
  Filled 2015-05-04 (×4): qty 1

## 2015-05-04 MED ORDER — NALBUPHINE HCL 10 MG/ML IJ SOLN
5.0000 mg | INTRAMUSCULAR | Status: DC | PRN
  Administered 2015-05-04: 5 mg via SUBCUTANEOUS
  Filled 2015-05-04: qty 0.5

## 2015-05-04 MED ORDER — SODIUM CHLORIDE 0.9 % IJ SOLN: 3.0000 mL | INTRAMUSCULAR | Status: DC | PRN

## 2015-05-04 MED ORDER — SIMETHICONE 80 MG PO CHEW
80.0000 mg | CHEWABLE_TABLET | Freq: Three times a day (TID) | ORAL | Status: DC
  Administered 2015-05-04 – 2015-05-07 (×10): 80 mg via ORAL
  Filled 2015-05-04 (×10): qty 1

## 2015-05-04 MED ORDER — MEPERIDINE HCL 25 MG/ML IJ SOLN: 6.2500 mg | INTRAMUSCULAR | Status: DC | PRN

## 2015-05-04 MED ORDER — ACETAMINOPHEN 325 MG PO TABS: 650.0000 mg | ORAL_TABLET | ORAL | Status: DC | PRN

## 2015-05-04 MED ORDER — FAMOTIDINE IN NACL 20-0.9 MG/50ML-% IV SOLN
20.0000 mg | Freq: Once | INTRAVENOUS | Status: AC
  Administered 2015-05-04: 20 mg via INTRAVENOUS
  Filled 2015-05-04: qty 50

## 2015-05-04 MED ORDER — SENNOSIDES-DOCUSATE SODIUM 8.6-50 MG PO TABS
2.0000 | ORAL_TABLET | ORAL | Status: DC
Start: 2015-05-05 — End: 2015-05-07
  Administered 2015-05-05 – 2015-05-07 (×3): 2 via ORAL
  Filled 2015-05-04 (×3): qty 2

## 2015-05-04 MED ORDER — 0.9 % SODIUM CHLORIDE (POUR BTL) OPTIME
TOPICAL | Status: DC | PRN
  Administered 2015-05-04: 1000 mL

## 2015-05-04 MED ORDER — LACTATED RINGERS IV SOLN
INTRAVENOUS | Status: DC
  Administered 2015-05-04: 11:00:00 via INTRAVENOUS

## 2015-05-04 MED ORDER — HYDROMORPHONE HCL 1 MG/ML IJ SOLN
INTRAMUSCULAR | Status: AC
  Filled 2015-05-04: qty 1

## 2015-05-04 MED ORDER — BUPIVACAINE IN DEXTROSE 0.75-8.25 % IT SOLN
INTRATHECAL | Status: DC | PRN
  Administered 2015-05-04: 12 mg via INTRATHECAL

## 2015-05-04 MED ORDER — ONDANSETRON HCL 4 MG/2ML IJ SOLN
INTRAMUSCULAR | Status: DC | PRN
  Administered 2015-05-04: 4 mg via INTRAVENOUS

## 2015-05-04 MED ORDER — NALOXONE HCL 0.4 MG/ML IJ SOLN: 0.4000 mg | INTRAMUSCULAR | Status: DC | PRN

## 2015-05-04 MED ORDER — MEPERIDINE HCL 25 MG/ML IJ SOLN
INTRAMUSCULAR | Status: DC | PRN
  Administered 2015-05-04 (×2): 12.5 mg via INTRAVENOUS

## 2015-05-04 MED ORDER — DIBUCAINE 1 % RE OINT: 1.0000 "application " | TOPICAL_OINTMENT | RECTAL | Status: DC | PRN

## 2015-05-04 MED ORDER — TRAMADOL HCL 50 MG PO TABS
50.0000 mg | ORAL_TABLET | Freq: Four times a day (QID) | ORAL | Status: DC | PRN
  Administered 2015-05-04 – 2015-05-07 (×5): 50 mg via ORAL
  Filled 2015-05-04 (×5): qty 1

## 2015-05-04 MED ORDER — WITCH HAZEL-GLYCERIN EX PADS: 1.0000 "application " | MEDICATED_PAD | CUTANEOUS | Status: DC | PRN

## 2015-05-04 MED ORDER — MEPERIDINE HCL 25 MG/ML IJ SOLN
INTRAMUSCULAR | Status: AC
  Filled 2015-05-04: qty 1

## 2015-05-04 MED ORDER — GENTAMICIN SULFATE 40 MG/ML IJ SOLN
Freq: Once | INTRAVENOUS | Status: AC
  Administered 2015-05-04: 100 mL via INTRAVENOUS
  Filled 2015-05-04: qty 9.5

## 2015-05-04 MED ORDER — SCOPOLAMINE 1 MG/3DAYS TD PT72
1.0000 | MEDICATED_PATCH | Freq: Once | TRANSDERMAL | Status: DC
  Filled 2015-05-04: qty 1

## 2015-05-04 MED ORDER — DIPHENHYDRAMINE HCL 50 MG/ML IJ SOLN: 12.5000 mg | INTRAMUSCULAR | Status: DC | PRN

## 2015-05-04 MED ORDER — PHENYLEPHRINE HCL 10 MG/ML IJ SOLN
INTRAMUSCULAR | Status: DC | PRN
  Administered 2015-05-04: 80 ug via INTRAVENOUS
  Administered 2015-05-04: 40 ug via INTRAVENOUS
  Administered 2015-05-04 (×6): 80 ug via INTRAVENOUS
  Administered 2015-05-04: 40 ug via INTRAVENOUS
  Administered 2015-05-04 (×2): 80 ug via INTRAVENOUS

## 2015-05-04 MED ORDER — PHENYLEPHRINE 8 MG IN D5W 100 ML (0.08MG/ML) PREMIX OPTIME
INJECTION | INTRAVENOUS | Status: DC | PRN
  Administered 2015-05-04: 60 ug/min via INTRAVENOUS

## 2015-05-04 MED ORDER — CITRIC ACID-SODIUM CITRATE 334-500 MG/5ML PO SOLN
30.0000 mL | Freq: Once | ORAL | Status: AC
  Administered 2015-05-04: 30 mL via ORAL
  Filled 2015-05-04: qty 15

## 2015-05-04 MED ORDER — PROMETHAZINE HCL 25 MG/ML IJ SOLN: 6.2500 mg | INTRAMUSCULAR | Status: DC | PRN

## 2015-05-04 MED ORDER — SIMETHICONE 80 MG PO CHEW: 80.0000 mg | CHEWABLE_TABLET | ORAL | Status: DC | PRN

## 2015-05-04 MED ORDER — NALBUPHINE HCL 10 MG/ML IJ SOLN
INTRAMUSCULAR | Status: AC
  Filled 2015-05-04: qty 1

## 2015-05-04 MED ORDER — LACTATED RINGERS IV SOLN
INTRAVENOUS | Status: DC | PRN
  Administered 2015-05-04 (×2): via INTRAVENOUS

## 2015-05-04 MED ORDER — INFLUENZA VAC SPLIT QUAD 0.5 ML IM SUSY
0.5000 mL | PREFILLED_SYRINGE | INTRAMUSCULAR | Status: AC
  Administered 2015-05-06: 0.5 mL via INTRAMUSCULAR
  Filled 2015-05-04: qty 0.5

## 2015-05-04 MED ORDER — ONDANSETRON HCL 4 MG/2ML IJ SOLN: 4.0000 mg | Freq: Three times a day (TID) | INTRAMUSCULAR | Status: DC | PRN

## 2015-05-04 MED ORDER — ZOLPIDEM TARTRATE 5 MG PO TABS: 5.0000 mg | ORAL_TABLET | Freq: Every evening | ORAL | Status: DC | PRN

## 2015-05-04 MED ORDER — OXYCODONE HCL 5 MG PO TABS
5.0000 mg | ORAL_TABLET | ORAL | Status: DC | PRN
  Administered 2015-05-04 – 2015-05-05 (×3): 5 mg via ORAL
  Filled 2015-05-04 (×3): qty 1

## 2015-05-04 MED ORDER — OXYCODONE-ACETAMINOPHEN 5-325 MG PO TABS
1.0000 | ORAL_TABLET | ORAL | Status: DC | PRN
  Administered 2015-05-04 (×2): 1 via ORAL
  Filled 2015-05-04 (×2): qty 1

## 2015-05-04 MED ORDER — LACTATED RINGERS IV SOLN
40.0000 [IU] | INTRAVENOUS | Status: DC | PRN
Start: 2015-05-04 — End: 2015-05-04
  Administered 2015-05-04: 40 [IU] via INTRAVENOUS

## 2015-05-04 MED ORDER — NALOXONE HCL 1 MG/ML IJ SOLN
1.0000 ug/kg/h | INTRAVENOUS | Status: DC | PRN
  Filled 2015-05-04: qty 2

## 2015-05-04 MED ORDER — MENTHOL 3 MG MT LOZG: 1.0000 | LOZENGE | OROMUCOSAL | Status: DC | PRN

## 2015-05-04 MED ORDER — NALBUPHINE HCL 10 MG/ML IJ SOLN
5.0000 mg | INTRAMUSCULAR | Status: DC | PRN
  Filled 2015-05-04: qty 0.5

## 2015-05-04 MED ORDER — GENTAMICIN SULFATE 40 MG/ML IJ SOLN: INTRAMUSCULAR | Status: DC

## 2015-05-04 MED ORDER — DIPHENHYDRAMINE HCL 25 MG PO CAPS: 25.0000 mg | ORAL_CAPSULE | Freq: Four times a day (QID) | ORAL | Status: DC | PRN

## 2015-05-04 MED ORDER — FENTANYL CITRATE (PF) 100 MCG/2ML IJ SOLN
INTRAMUSCULAR | Status: DC | PRN
  Administered 2015-05-04: 10 ug via INTRATHECAL

## 2015-05-04 MED ORDER — HYDROMORPHONE HCL 1 MG/ML IJ SOLN
0.2500 mg | INTRAMUSCULAR | Status: DC | PRN
  Administered 2015-05-04 (×4): 0.5 mg via INTRAVENOUS

## 2015-05-04 SURGICAL SUPPLY — 35 items
CLAMP CORD UMBIL (MISCELLANEOUS) ×2 IMPLANT
CLOTH BEACON ORANGE TIMEOUT ST (SAFETY) ×2 IMPLANT
CONTAINER PREFILL 10% NBF 15ML (MISCELLANEOUS) IMPLANT
DRAPE SHEET LG 3/4 BI-LAMINATE (DRAPES) ×2 IMPLANT
DRSG OPSITE POSTOP 4X10 (GAUZE/BANDAGES/DRESSINGS) ×2 IMPLANT
DURAPREP 26ML APPLICATOR (WOUND CARE) ×2 IMPLANT
ELECT REM PT RETURN 9FT ADLT (ELECTROSURGICAL) ×2
ELECTRODE REM PT RTRN 9FT ADLT (ELECTROSURGICAL) ×1 IMPLANT
EXTRACTOR VACUUM M CUP 4 TUBE (SUCTIONS) IMPLANT
GLOVE BIO SURGEON STRL SZ 6.5 (GLOVE) IMPLANT
GLOVE BIOGEL PI IND STRL 7.0 (GLOVE) ×3 IMPLANT
GLOVE BIOGEL PI INDICATOR 7.0 (GLOVE) ×3
GLOVE SKINSENSE NS SZ6.5 (GLOVE) ×2
GLOVE SKINSENSE STRL SZ6.5 (GLOVE) ×2 IMPLANT
GOWN STRL REUS W/TWL LRG LVL3 (GOWN DISPOSABLE) ×4 IMPLANT
KIT ABG SYR 3ML LUER SLIP (SYRINGE) IMPLANT
NEEDLE HYPO 22GX1.5 SAFETY (NEEDLE) IMPLANT
NEEDLE HYPO 25X5/8 SAFETYGLIDE (NEEDLE) IMPLANT
NS IRRIG 1000ML POUR BTL (IV SOLUTION) ×2 IMPLANT
PACK C SECTION WH (CUSTOM PROCEDURE TRAY) ×2 IMPLANT
PAD OB MATERNITY 4.3X12.25 (PERSONAL CARE ITEMS) ×2 IMPLANT
PENCIL SMOKE EVAC W/HOLSTER (ELECTROSURGICAL) IMPLANT
STRIP CLOSURE SKIN 1/2X4 (GAUZE/BANDAGES/DRESSINGS) IMPLANT
SUT MON AB 4-0 PS1 27 (SUTURE) ×2 IMPLANT
SUT PLAIN 0 NONE (SUTURE) IMPLANT
SUT PLAIN 2 0 XLH (SUTURE) ×2 IMPLANT
SUT VIC AB 0 CT1 36 (SUTURE) ×4 IMPLANT
SUT VIC AB 0 CTX 36 (SUTURE) ×2
SUT VIC AB 0 CTX36XBRD ANBCTRL (SUTURE) ×2 IMPLANT
SUT VIC AB 2-0 CT1 27 (SUTURE) ×1
SUT VIC AB 2-0 CT1 TAPERPNT 27 (SUTURE) ×1 IMPLANT
SYR CONTROL 10ML LL (SYRINGE) IMPLANT
TOWEL OR 17X24 6PK STRL BLUE (TOWEL DISPOSABLE) ×2 IMPLANT
TRAY FOLEY BAG SILVER LF 16FR (CATHETERS) ×2 IMPLANT
TRAY FOLEY CATH SILVER 14FR (SET/KITS/TRAYS/PACK) IMPLANT

## 2015-05-04 NOTE — Anesthesia Preprocedure Evaluation (Addendum)
Anesthesia Evaluation  Patient identified by MRN, date of birth, ID band Patient awake    Reviewed: Allergy & Precautions, H&P , NPO status , Patient's Chart, lab work & pertinent test results  Airway Mallampati: II  TM Distance: >3 FB Neck ROM: full    Dental no notable dental hx.    Pulmonary neg pulmonary ROS, former smoker,  breath sounds clear to auscultation  Pulmonary exam normal       Cardiovascular negative cardio ROS Normal cardiovascular exam    Neuro/Psych    GI/Hepatic negative GI ROS, Neg liver ROS,   Endo/Other  diabetes  Renal/GU negative Renal ROS     Musculoskeletal   Abdominal (+) + obese,   Peds  Hematology negative hematology ROS (+)   Anesthesia Other Findings   Reproductive/Obstetrics (+) Pregnancy                            Anesthesia Physical Anesthesia Plan  ASA: II  Anesthesia Plan: Spinal   Post-op Pain Management:    Induction:   Airway Management Planned:   Additional Equipment:   Intra-op Plan:   Post-operative Plan:   Informed Consent: I have reviewed the patients History and Physical, chart, labs and discussed the procedure including the risks, benefits and alternatives for the proposed anesthesia with the patient or authorized representative who has indicated his/her understanding and acceptance.     Plan Discussed with: CRNA and Surgeon  Anesthesia Plan Comments:         Anesthesia Quick Evaluation

## 2015-05-04 NOTE — Addendum Note (Signed)
Addendum  created 05/04/15 0758 by Flossie Dibble, CRNA   Modules edited: Notes Section   Notes Section:  File: [id number]

## 2015-05-04 NOTE — MAU Note (Addendum)
Pt states that she began hurting a few hours ago...unsure if they were ctx or not. Also isnt sure if SROM...having some minimal LOF. Denies VB, +FM. Repeat CS scheduled 05/06/15

## 2015-05-04 NOTE — Anesthesia Procedure Notes (Addendum)
Spinal Patient location during procedure: OR Start time: 05/04/2015 2:48 AM End time: 05/04/2015 2:51 AM Staffing Anesthesiologist: Lyn Hollingshead Performed by: anesthesiologist  Preanesthetic Checklist Completed: patient identified, surgical consent, pre-op evaluation, timeout performed, IV checked, risks and benefits discussed and monitors and equipment checked Spinal Block Patient position: sitting Prep: site prepped and draped and DuraPrep Patient monitoring: heart rate, cardiac monitor, continuous pulse ox and blood pressure Approach: midline Location: L3-4 Injection technique: single-shot Needle Needle type: Pencan  Needle gauge: 24 G Needle length: 9 cm Needle insertion depth: 6 cm Assessment Sensory level: T4

## 2015-05-04 NOTE — Lactation Note (Signed)
This note was copied from the chart of [place]. Lactation Consultation Note  Mother states she breastfed other child for 4 weeks. Discussed stomach size and supply and demand. Mom encouraged to feed baby 8-12 times/24 hours and with feeding cues.  Mom made aware of O/P services, breastfeeding support groups, community resources, and our phone # for post-discharge questions.    Patient Name: [name] [name] [id number]'H Date: 05/04/2015 Reason for consult: Initial assessment   Maternal Data Has patient been taught Hand Expression?: Yes Does the patient have breastfeeding experience prior to this delivery?: Yes  Feeding Feeding Type: Breast Fed Length of feed: 20 min  LATCH Score/Interventions                      Lactation Tools Discussed/Used     Consult Status Consult Status: Follow-up Date: 05/05/15 Follow-up type: In-patient    Vivianne Master [facility] 05/04/2015, 3:00 PM

## 2015-05-04 NOTE — Progress Notes (Addendum)
RN IN ROOM  AND  PT CRYING WITH PAIN. WOKE UP WITH PAIN IN INCISION AND CRAMPING. TRAMADOL 50 MG PO GIVEN

## 2015-05-04 NOTE — Transfer of Care (Signed)
Immediate Anesthesia Transfer of Care Note  Patient: [name] [name]  Procedure(s) Performed: Procedure(s): CESAREAN SECTION (N/A)  Patient Location: PACU  Anesthesia Type:Spinal  Level of Consciousness: awake, alert  and oriented  Airway & Oxygen Therapy: Patient Spontanous Breathing  Post-op Assessment: Report given to RN and Post -op Vital signs reviewed and stable  Post vital signs: Reviewed and stable  Last Vitals:  Filed Vitals:   05/04/15 0116  BP: 135/82  Pulse: 95    Complications: No apparent anesthesia complications

## 2015-05-04 NOTE — Anesthesia Postprocedure Evaluation (Signed)
Anesthesia Post Note  Patient: [name] [name]  Procedure(s) Performed: Procedure(s) (LRB): CESAREAN SECTION (N/A)  Anesthesia type: Spinal  Patient location: PACU  Post pain: Pain level controlled  Post assessment: Post-op Vital signs reviewed  Last Vitals:  Filed Vitals:   05/04/15 0116  BP: 135/82  Pulse: 95    Post vital signs: Reviewed  Level of consciousness: awake  Complications: No apparent anesthesia complications

## 2015-05-04 NOTE — H&P (Signed)
LUCY BOARDMAN is a 34 y.o. G3P1011 at [redacted]w[redacted]d presenting for active labor. Pt notes onset contractions at 9 pm, getting stronger . Good fetal movement, No vaginal bleeding, slight leaking fluid. Pt reports increase in swelling over the weekend despite rest. No HA, no vision change, no RUQ pain.   [facility] at [place] since  1st trimester - dated by 1st trimester u/s - prior c/s, planning RCS - GDM, A1, diagnosis at 16 wks, early DS 180, done for h/o GDM in prior preg. Pt reports nl fetal echo - GBS positive, Clindamycin resistant. Pt w/ PCN allergy- hives - Anemia. Hgb 9.5 last wk - Gest htn, Elevated bp's >140/90 on 2 outpt visits last wk. PEC neg (plts 157, Hgb 95)   Prenatal Transfer Tool  Maternal Diabetes: Yes:  Diabetes Type:  Diet controlled Genetic Screening: Normal Maternal Ultrasounds/Referrals: Normal Fetal Ultrasounds or other Referrals:  Fetal echo Maternal Substance Abuse:  No Significant Maternal Medications:  None Significant Maternal Lab Results: None     OB History    Gravida Para Term Preterm AB TAB SAB Ectopic Multiple Living   3 1 1  1 1    1      Past Medical History  Diagnosis Date  . Headache(784.0)     migraine  . Depression     weaned off meds during pregnancy  . History of condyloma acuminatum   . History of chicken pox   . Anxiety   . Gestational diabetes     glyburide    Past Surgical History  Procedure Laterality Date  . Knee surgery      L meniscus tear  . Cesarean section  06/25/2011    Procedure: CESAREAN SECTION;  Surgeon: Marvene Staff, MD;  Location: [facility];  Service: Gynecology;  Laterality: N/A;  primary of baby girl  at  66 APGAR 8/9   Family History: family history is negative for Anesthesia problems, Hypotension, Malignant hyperthermia, and Pseudochol deficiency. Social History:  reports that she quit smoking about 4 years ago. She has never used smokeless tobacco. She reports that she does not drink alcohol or use  illicit drugs.  Review of Systems - Negative except increasingly painful contractions   Dilation: 3 Effacement (%): 60 Station: -1 Exam by:: K henson Blood pressure 135/82, pulse 95, height 5\' 5"  (1.651 m), weight 102.513 kg (226 lb).  Physical Exam:  Gen: breathing heavily, uncomfortable with contractions Back: no CVAT Abd: gravid, NT, no RUQ pain LE: trace edema, equal bilaterally, non-tender Toco: not tracing well, ctx q 3 min FH: baseline 140s, accelerations present, no deceleratons, 10 beat variability  Prenatal labs: ABO, Rh: AB/Positive/-- (02/03 0000) Antibody: Negative (02/03 0000) Rubella:  immune RPR: Nonreactive (02/03 0000)  HBsAg: Negative (02/03 0000)  HIV: Non-reactive (02/03 0000)  GBS: Positive (08/10 0000)  1 hr Glucola 180  Genetic screening nl NT Anatomy US normal   Assessment/Plan: 34 y.o. G3P1011 at [redacted]w[redacted]d Active labor, planning RCS. R/B of RCS d/w pt who agrees to proceed Anemia. Watch EBL and PP CBC GDM. Check BS now, BS fasting am POD #1 Gest htn, no sx of PEC, labs now. Would move to delivery by bp criteria for gest htn.   Ayumi Wangerin A. 05/04/2015 1:55 AM

## 2015-05-04 NOTE — Op Note (Addendum)
05/04/2015  4:15 AM  PATIENT:  [name] [name]  34 y.o. female  PRE-OPERATIVE DIAGNOSIS:  Labor, prior c/s, gest htn, GDM A1  POST-OPERATIVE DIAGNOSIS:  Labor, s/p RCS, gest htn, A1GDM  PROCEDURE:  Procedure(s): CESAREAN SECTION (N/A), repeat, 2 layer closure  SURGEON:  Surgeon(s) and Role:    * Aloha Gell, MD - Primary  PHYSICIAN ASSISTANT:   ASSISTANTS: Renato Battles, CNM   ANESTHESIA:   spinal  EBL:  Total I/O In: 1700 [I.V.:1700] Out: 850 [Urine:100; Blood:750]  BLOOD ADMINISTERED:none  DRAINS: Urinary Catheter (Foley)   LOCAL MEDICATIONS USED:  NONE  SPECIMEN:  Source of Specimen:  placenta, cord blood  DISPOSITION OF SPECIMEN:  L&D  COUNTS:  YES  TOURNIQUET:  * No tourniquets in log *  DICTATION: .Note written in EPIC  PLAN OF CARE: Admit to inpatient   PATIENT DISPOSITION:  PACU - hemodynamically stable.   Delay start of Pharmacological VTE agent (>24hrs) due to surgical blood loss or risk of bleeding: yes   Findings:  @BABYSEXEBC @ infant,  APGAR (1 MIN): 8   APGAR (5 MINS): 9   APGAR (10 MINS):   Normal uterus, tubes and ovaries, normal placenta. 3VC, clear amniotic fluid, omental adhesions to peritoneum  EBL: 750 cc Antibiotics:  Gent/ Clinda Complications: none  Indications: This is a 34 y.o. year-old, G3P1011  At [redacted]w[redacted]d admitted for active labor in setting of obesity, GDM, gest htn and prior c/s. Risks benefits and alternatives of the procedure were discussed with the patient who agreed to proceed  Procedure:  After informed consent was obtained the patient was taken to the operating room where spinal anesthesia was initiated.  She was prepped and draped in the normal sterile fashion in dorsal supine position with a leftward tilt.  A foley catheter was in place.  A Pfannenstiel skin incision was made 2 cm above the pubic symphysis in the midline with the scalpel.  Dissection was carried down with the Bovie cautery until the fascia was reached. The  fascia was incised in the midline. The incision was extended laterally with the Mayo scissors. The inferior aspect of the fascial incision was grasped with the Coker clamps, elevated up and the underlying rectus muscles were dissected off sharply. The superior aspect of the fascial incision was grasped with the Coker clamps elevated up and the underlying rectus muscles were dissected off sharply.  The peritoneum was entered bluntly. The peritoneal incision was extended superiorly and inferiorly with good visualization of the bladder. The bladder blade was inserted and palpation was done to assess the fetal position and the location of the uterine vessels. The lower segment of the uterus was incised sharply with the scalpel and extended  bluntly in the cephalo-caudal fashion. The infant was grasped, brought to the incision,  rotated and the infant was delivered with fundal pressure. The nose and mouth were bulb suctioned. The cord was clamped and cut. The infant was handed off to the waiting pediatrician. The placenta was expressed. The uterus was exteriorized. The uterus was cleared of all clots and debris. The uterine incision was repaired with 0 Vicryl in a running locked fashion.  A second layer of the same suture was used in an imbricating fashion to obtain excellent hemostasis. Several additional sutures were placed in the inferior L side of the incision. The uterus was then returned to the abdomen, the gutters were cleared of all clots and debris. The uterine incision was reinspected and found to be hemostatic. The peritoneum was  inspected and broad omental adhesion noted to the right side of the peritoneum. Decision was made to not close the peritoneum.  The cut muscle edges and the underside of the fascia were inspected and found to be hemostatic. The fascia was closed in a single layer. The subcutaneous tissue was irrigated. Scarpa's layer was closed with a 2-0 plain gut suture. The skin was closed with a  4-0 Monocryl in a single layer. The patient tolerated the procedure well. Sponge lap and needle counts were correct x3 and patient was taken to the recovery room in a stable condition.  Destiney Sanabia A. 05/04/2015 4:17 AM   Addendum to above: dark thin meconium noted at delivery, not clear fluid as stated above.  Mayerly Kaman A. 05/04/2015 11:12 AM

## 2015-05-04 NOTE — Anesthesia Postprocedure Evaluation (Signed)
Anesthesia Post Note  Patient: [name] [name]  Procedure(s) Performed: Procedure(s) (LRB): CESAREAN SECTION (N/A)  Anesthesia type: Spinal  Patient location: Mother/Baby  Post pain: Pain level controlled  Post assessment: Post-op Vital signs reviewed  Last Vitals:  Filed Vitals:   05/04/15 0635  BP: 120/52  Pulse: 107  Temp: 36.9 C  Resp: 20    Post vital signs: Reviewed  Level of consciousness: awake  Complications: No apparent anesthesia complications

## 2015-05-04 NOTE — Brief Op Note (Signed)
05/04/2015  4:15 AM  PATIENT:  [name] [name]  34 y.o. female  PRE-OPERATIVE DIAGNOSIS:  Labor, prior c/s, gest htn, GDM A1  POST-OPERATIVE DIAGNOSIS:  Labor, s/p RCS, gest htn, A1GDM  PROCEDURE:  Procedure(s): CESAREAN SECTION (N/A), repeat, 2 layer closure  SURGEON:  Surgeon(s) and Role:    * Aloha Gell, MD - Primary  PHYSICIAN ASSISTANT:   ASSISTANTS: Renato Battles, CNM   ANESTHESIA:   spinal  EBL:  Total I/O In: 1700 [I.V.:1700] Out: 850 [Urine:100; Blood:750]  BLOOD ADMINISTERED:none  DRAINS: Urinary Catheter (Foley)   LOCAL MEDICATIONS USED:  NONE  SPECIMEN:  Source of Specimen:  placenta, cord blood  DISPOSITION OF SPECIMEN:  L&D  COUNTS:  YES  TOURNIQUET:  * No tourniquets in log *  DICTATION: .Note written in EPIC  PLAN OF CARE: Admit to inpatient   PATIENT DISPOSITION:  PACU - hemodynamically stable.   Delay start of Pharmacological VTE agent (>24hrs) due to surgical blood loss or risk of bleeding: yes

## 2015-05-05 ENCOUNTER — Inpatient Hospital Stay (HOSPITAL_COMMUNITY)
Admission: RE | Admit: 2015-05-05 | Discharge: 2015-05-05 | Disposition: A | Discharge status: Home / Self Care | Payer: PRIVATE HEALTH INSURANCE | Source: Ambulatory Visit

## 2015-05-05 ENCOUNTER — Encounter (HOSPITAL_COMMUNITY): Payer: Self-pay | Admitting: Obstetrics and Gynecology

## 2015-05-05 DIAGNOSIS — IMO0001 Reserved for inherently not codable concepts without codable children: Secondary | ICD-10-CM | POA: Diagnosis present

## 2015-05-05 LAB — CBC
HEMATOCRIT: 20.8 % — AB (ref 36.0–46.0)
HEMOGLOBIN: 6.8 g/dL — AB (ref 12.0–15.0)
MCH: 26.9 pg (ref 26.0–34.0)
MCHC: 32.7 g/dL (ref 30.0–36.0)
MCV: 82.2 fL (ref 78.0–100.0)
Platelets: 116 10*3/uL — ABNORMAL LOW (ref 150–400)
RBC: 2.53 MIL/uL — ABNORMAL LOW (ref 3.87–5.11)
RDW: 14.7 % (ref 11.5–15.5)
WBC: 9.7 10*3/uL (ref 4.0–10.5)

## 2015-05-05 LAB — BIRTH TISSUE RECOVERY COLLECTION (PLACENTA DONATION)

## 2015-05-05 MED ORDER — OXYCODONE-ACETAMINOPHEN 5-325 MG PO TABS
1.0000 | ORAL_TABLET | ORAL | Status: DC | PRN
  Administered 2015-05-05 – 2015-05-07 (×13): 2 via ORAL
  Filled 2015-05-05 (×13): qty 2

## 2015-05-05 MED ORDER — MAGNESIUM 200 MG PO TABS
200.0000 mg | ORAL_TABLET | Freq: Every day | ORAL | Status: DC
  Administered 2015-05-06 – 2015-05-07 (×2): 200 mg via ORAL
  Filled 2015-05-05 (×3): qty 1

## 2015-05-05 MED ORDER — POLYSACCHARIDE IRON COMPLEX 150 MG PO CAPS
150.0000 mg | ORAL_CAPSULE | Freq: Two times a day (BID) | ORAL | Status: DC
  Administered 2015-05-05 – 2015-05-07 (×4): 150 mg via ORAL
  Filled 2015-05-05 (×4): qty 1

## 2015-05-05 MED ORDER — OXYCODONE-ACETAMINOPHEN 5-325 MG PO TABS: 1.0000 | ORAL_TABLET | Freq: Four times a day (QID) | ORAL | Status: DC | PRN

## 2015-05-05 NOTE — Progress Notes (Signed)
CSW attempted to meet with parents to offer support and complete assessment due to NICU admission, but they were not in MOB's room at this time.

## 2015-05-05 NOTE — Progress Notes (Signed)
Patient ID: [name] [name], female   DOB: [birth year], 34 y.o.   MRN: [id number] Subjective: S/P Repeat Cesarean Delivery POD# 1 Information for the patient's newborn:  Bernestine, [place] [[id number]]  female  / circ planning prior to d/c - infant in NICU  Reports feeling more sore with moving around Feeding: bottle Patient reports tolerating PO.  Breast symptoms: none Pain minimally controlled with narcotic analgesics including oxycodone (Oxycontin, Oxyir), Percocet and tramadol (Ultram) Denies HA/SOB/C/P/N/V/dizziness. Flatus none. No BM. She reports vaginal bleeding as normal, without clots.  She is ambulating, urinating without difficult.     Objective:   VS:  Filed Vitals:   05/04/15 0930 05/04/15 1330 05/04/15 1736 05/05/15 0559  BP: 122/64  128/61 123/63  Pulse: 93  95 77  Temp: 98.4 F (36.9 C) 98.2 F (36.8 C) 98.6 F (37 C) 97.8 F (36.6 C)  TempSrc: Oral Oral Oral Oral  Resp: 20 20 20 16   Height:      Weight:      SpO2: 99% 99% 100%      Intake/Output Summary (Last 24 hours) at 05/05/15 1055 Last data filed at 05/04/15 1800  Gross per 24 hour  Intake      0 ml  Output    575 ml  Net   -575 ml        Recent Labs  05/04/15 0145 05/05/15 0510  WBC 13.2* 9.7  HGB 9.4* 6.8*  HCT 28.8* 20.8*  PLT 150 116*     Blood type: AB POS (09/06 0145)  Rubella: Immune (02/03 0000)     Physical Exam:  General: alert, cooperative and no distress CV: Regular rate and rhythm, S1S2 present or without murmur or extra heart sounds Resp: clear Abdomen: soft, nontender, normal bowel sounds Incision: clean, dry and intact Uterine Fundus: firm, 1 FB below umbilicus, nontender Lochia: minimal Ext: edema 1+ and Homans sign is negative, no sign of DVT   Assessment/Plan: 34 y.o.   POD# 1.  s/p Cesarean Delivery.  Indications: repeat                Principal Problem:   Postpartum care following cesarean delivery (9/6) Active Problems:   Gestational hypertension w/o  significant proteinuria in 3rd trimester   Maternal iron deficiency anemia compounded with ABL anemia  Doing well, stable.               Regular diet as tolerated D/C foley per unit protocol Ambulate Percocet x 2 tabs every 4 hrs Start Niferex 150 mg BID Start Magnesium Oxide 200 mg daily Routine post-op care  Graceann Congress, MSN, CNM 05/05/2015, 10:55 AM

## 2015-05-05 NOTE — Lactation Note (Signed)
This note was copied from the chart of [place]. Lactation Consultation Note: Follow up with mom. Baby is in NICU for unstable blood sugars. Mom is pumping and getting small amounts of colostrum, which she is taking to NICU. Mom voiced no concerns with BF at this time. Mom was given Providing Breast milk for your baby in NICU pamphlet, and questions answered about timing of milk coming in.  Told to call for assistance/ questions prn and when ready to put baby back to breast. Mom is G2 and Breast/Bottle fed 1st child x 4 weeks.   Patient Name: [name] [name] [id number]'P Date: 05/05/2015     Maternal Data    Feeding Feeding Type: Formula Nipple Type: Regular Length of feed: 25 min  LATCH Score/Interventions                      Lactation Tools Discussed/Used     Consult Status      Donn Pierini 05/05/2015, 3:31 PM

## 2015-05-06 ENCOUNTER — Encounter (HOSPITAL_COMMUNITY): Admission: RE | Payer: Self-pay | Source: Ambulatory Visit

## 2015-05-06 ENCOUNTER — Inpatient Hospital Stay (HOSPITAL_COMMUNITY): Admission: RE | Admit: 2015-05-06 | Payer: 59 | Source: Ambulatory Visit | Admitting: Obstetrics and Gynecology

## 2015-05-06 DIAGNOSIS — O24410 Gestational diabetes mellitus in pregnancy, diet controlled: Secondary | ICD-10-CM | POA: Diagnosis present

## 2015-05-06 DIAGNOSIS — D62 Acute posthemorrhagic anemia: Secondary | ICD-10-CM | POA: Diagnosis not present

## 2015-05-06 SURGERY — Surgical Case
Anesthesia: Spinal

## 2015-05-06 NOTE — Progress Notes (Signed)
MOB was referred for history of depression/anxiety. * Referral screened out by Clinical Social Worker because none of the following criteria appear to apply: ~ History of anxiety/depression during this pregnancy, or of post-partum depression. ~ Diagnosis of anxiety and/or depression within last 3 years OR * MOB's symptoms currently being treated with medication and/or therapy. Please contact the Clinical Social Worker if needs arise, or if MOB requests.  Consult also noted "baby in NICU."  CSW spoke with NNP who states tentative plan for baby to discharge tomorrow at time of MOB's discharge.

## 2015-05-06 NOTE — Progress Notes (Signed)
POD # 2  Subjective: Pt reports feeling well/ Pain controlled with Percocet Tolerating po/Voiding without problems/ No n/v/ Flatus present Activity: ad lib Bleeding is light Newborn info:  Information for the patient's newborn:  Beata, Beason [[id number]]  female   Feeding: breastpumping/ NICU   Objective: VS:  Filed Vitals:   05/04/15 1736 05/05/15 0559 05/05/15 1829 05/06/15 0517  BP: 128/61 123/63 125/61 127/70  Pulse: 95 77 93 79  Temp: 98.6 F (37 C) 97.8 F (36.6 C) 98.1 F (36.7 C) 98.2 F (36.8 C)  TempSrc: Oral Oral Oral Oral  Resp: 20 16 18 16   Height:      Weight:      SpO2: 100%       I&O: Intake/Output      09/07 0701 - 09/08 0700 09/08 0701 - 09/09 0700   Urine (mL/kg/hr)     Total Output       Net            Urine Occurrence 1 x      LABS:  Recent Labs  05/04/15 0145 05/05/15 0510  WBC 13.2* 9.7  HGB 9.4* 6.8*  HCT 28.8* 20.8*  PLT 150 116*    Blood type: --/--/AB POS, AB POS (09/06 0145) Rubella: Immune (02/03 0000)     Physical Exam:  General: alert, cooperative and no distress CV: Regular rate and rhythm Resp: CTA bilaterally Abdomen: soft, nontender, normal bowel sounds Uterine Fundus: firm, below umbilicus, nontender Incision: Covered with Tegaderm and honeycomb dressing; no significant drainage, edema, bruising, or erythema; well approximated with suture Lochia: minimal Ext: extremities normal, atraumatic, no cyanosis or edema and Homans sign is negative, no sign of DVT    Assessment/: POD # 2/ G3P2012/ S/P C/Section d/t repeat in labor  IDA with compounding ABL anemia A1GDM, delivered Gestational HTN, delivered-stable Doing well  Plan: Continue routine post op orders Anticipate discharge home tomorrow   Signed: Julianne Handler, Delane Ginger, MSN, CNM 05/06/2015, 10:36 AM

## 2015-05-07 MED ORDER — OXYCODONE-ACETAMINOPHEN 5-325 MG PO TABS: 1.0000 | ORAL_TABLET | ORAL | Status: DC | PRN

## 2015-05-07 MED ORDER — POLYSACCHARIDE IRON COMPLEX 150 MG PO CAPS: 150.0000 mg | ORAL_CAPSULE | Freq: Two times a day (BID) | ORAL | Status: DC

## 2015-05-07 MED ORDER — TRAMADOL HCL 50 MG PO TABS: 50.0000 mg | ORAL_TABLET | Freq: Four times a day (QID) | ORAL | Status: DC | PRN

## 2015-05-07 MED ORDER — MAGNESIUM 200 MG PO TABS: 200.0000 mg | ORAL_TABLET | Freq: Two times a day (BID) | ORAL | Status: DC

## 2015-05-07 MED ORDER — MAGNESIUM 200 MG PO TABS
200.0000 mg | ORAL_TABLET | Freq: Two times a day (BID) | ORAL | Status: DC
  Filled 2015-05-07 (×2): qty 1

## 2015-05-07 NOTE — Discharge Summary (Signed)
POSTOPERATIVE DISCHARGE SUMMARY:  Patient ID: [name] [name] MRN: [id number] DOB/AGE: May 26, 1981 34 y.o.  Admit date: 05/04/2015 Admission Diagnoses: 39.3 weeks / gestational hypertension / GDMa1  Discharge date:  05/07/2015 Discharge Diagnoses: POD Cesarean section / IDA with ABL anemia / mild thormbocytopenia  Prenatal history: [id number]   EDC : 05/08/2015, by Other Basis  Prenatal care at [facility]  Primary provider : Pamala Hurry Prenatal course complicated by anemia / migraine / GERD / GDM / anxiety  Prenatal Labs: ABO, Rh: --/--/AB POS, AB POS (09/06 0145)  Antibody: NEG (09/06 0145) Rubella: Immune (02/03 0000)   RPR: Non Reactive (09/06 0145)  HBsAg: Negative (02/03 0000)  HIV: Non-reactive (02/03 0000)  GTT ABNORMAL GBS: Positive (08/10 0000)   Medical / Surgical History :  Past medical history:  Past Medical History  Diagnosis Date  . Headache(784.0)     migraine  . Depression     weaned off meds during pregnancy  . History of condyloma acuminatum   . History of chicken pox   . Anxiety   . Gestational diabetes     glyburide   . Maternal iron deficiency anemia 05/05/2015    Past surgical history:  Past Surgical History  Procedure Laterality Date  . Knee surgery      L meniscus tear  . Cesarean section  06/25/2011    Procedure: CESAREAN SECTION;  Surgeon: Marvene Staff, MD;  Location: [facility];  Service: Gynecology;  Laterality: N/A;  primary of baby girl  at  9 APGAR 8/9  . Cesarean section N/A 05/04/2015    Procedure: CESAREAN SECTION;  Surgeon: Aloha Gell, MD;  Location: [facility];  Service: Obstetrics;  Laterality: N/A;    Family History:  Family History  Problem Relation Age of Onset  . Anesthesia problems Neg Hx   . Hypotension Neg Hx   . Malignant hyperthermia Neg Hx   . Pseudochol deficiency Neg Hx     Social History:  reports that she quit smoking about 4 years ago. She has never used smokeless tobacco. She reports that  she does not drink alcohol or use illicit drugs.  Allergies: Ibuprofen; Food; Latex; and Penicillins   Current Medications at time of admission:  Prior to Admission medications   Medication Sig Start Date End Date Taking? Authorizing Provider  butalbital-acetaminophen-caffeine (FIORICET, ESGIC) 50-325-40 MG per tablet TAKE 1 - 2 TABLETS EVERY 4 HOURS AS NEEDED FOR HEADACHES 03/12/15  Yes Historical Provider, MD  cyclobenzaprine (FLEXERIL) 10 MG tablet TAKE 1 TABLET BY MOUTH THREE TIMES DAILY AS NEEDED FOR MUSCLE SPASMS 03/23/15  Yes Historical Provider, MD  diphenhydrAMINE (BENADRYL) 25 mg capsule Take 25 mg by mouth at bedtime as needed for itching or sleep.    Yes Historical Provider, MD  Prenatal Vit-Fe Fumarate-FA (PRENATAL VITAMIN PO) Take 1 tablet by mouth daily.    Yes Historical Provider, MD  promethazine (PHENERGAN) 25 MG tablet Take 25 mg by mouth every 6 (six) hours as needed for nausea or vomiting.   Yes Historical Provider, MD  ranitidine (ZANTAC) 150 MG tablet Take 150 mg by mouth 3 (three) times daily.    Yes Historical Provider, MD  zolpidem (AMBIEN) 10 MG tablet Take 5 mg by mouth at bedtime as needed for sleep.   Yes Historical Provider, MD   Procedures: Cesarean section delivery on 05/04/2015 with delivery of viable female newborn by Dr Pamala Hurry   See operative report for further details APGAR (1 MIN): 8   APGAR (  5 MINS): 9    Postoperative / postpartum course:  Uncomplicated with discharge on POD 3  Discharge Instructions:  Discharged Condition: stable  Activity: pelvic rest and postoperative restrictions x 2   Diet: routine  Medications:    Medication List    STOP taking these medications        diphenhydrAMINE 25 mg capsule  Commonly known as:  BENADRYL     promethazine 25 MG tablet  Commonly known as:  PHENERGAN     zolpidem 10 MG tablet  Commonly known as:  AMBIEN      TAKE these medications        butalbital-acetaminophen-caffeine 50-325-40 MG  per tablet  Commonly known as:  FIORICET, ESGIC  TAKE 1 - 2 TABLETS EVERY 4 HOURS AS NEEDED FOR HEADACHES     cyclobenzaprine 10 MG tablet  Commonly known as:  FLEXERIL  TAKE 1 TABLET BY MOUTH THREE TIMES DAILY AS NEEDED FOR MUSCLE SPASMS     iron polysaccharides 150 MG capsule  Commonly known as:  NIFEREX  Take 1 capsule (150 mg total) by mouth 2 (two) times daily.     Magnesium 200 MG Tabs  Take 1 tablet (200 mg total) by mouth 2 (two) times daily.     oxyCODONE-acetaminophen 5-325 MG per tablet  Commonly known as:  PERCOCET/ROXICET  Take 1-2 tablets by mouth every 4 (four) hours as needed for moderate pain or severe pain.     PRENATAL VITAMIN PO  Take 1 tablet by mouth daily.     ranitidine 150 MG tablet  Commonly known as:  ZANTAC  Take 150 mg by mouth 3 (three) times daily.     traMADol 50 MG tablet  Commonly known as:  ULTRAM  Take 1 tablet (50 mg total) by mouth every 6 (six) hours as needed (for pain).        Wound Care: keep clean and dry / remove honeycomb POD 5 Postpartum Instructions: [facility] discharge booklet - instructions reviewed  Discharge to: Home  Follow up :   [facility] in 6 weeks for routine postpartum visit with Dr Pamala Hurry - NEEDS 2hr GTT at 6 week PP visit                Signed: Artelia Laroche CNM, MSN, [facility] 05/07/2015, 1:20 PM

## 2015-05-07 NOTE — Discharge Instructions (Signed)
Iron-Rich Diet An iron-rich diet contains foods that are good sources of iron. Iron is an important mineral that helps your body produce hemoglobin. Hemoglobin is a protein in red blood cells that carries oxygen to the body's tissues. Sometimes, the iron level in your blood can be low. This may be caused by:  A lack of iron in your diet.  Blood loss.  Times of growth, such as during pregnancy or during a child's growth and development. Low levels of iron can cause a decrease in the number of red blood cells. This can result in iron deficiency anemia. Iron deficiency anemia symptoms include:  Tiredness.  Weakness.  Irritability.  Increased chance of infection. Here are some recommendations for daily iron intake:  Males older than 34 years of age need 8 mg of iron per day.  Women ages 19 to 50 need 18 mg of iron per day.  Pregnant women need 27 mg of iron per day, and women who are over 19 years of age and breastfeeding need 9 mg of iron per day.  Women over the age of 50 need 8 mg of iron per day. SOURCES OF IRON There are 2 types of iron that are found in food: heme iron and nonheme iron. Heme iron is absorbed by the body better than nonheme iron. Heme iron is found in meat, poultry, and fish. Nonheme iron is found in grains, beans, and vegetables. Heme Iron Sources Food / Iron (mg)  Chicken liver, 3 oz (85 g)/ 10 mg  Beef liver, 3 oz (85 g)/ 5.5 mg  Oysters, 3 oz (85 g)/ 8 mg  Beef, 3 oz (85 g)/ 2 to 3 mg  Shrimp, 3 oz (85 g)/ 2.8 mg  Turkey, 3 oz (85 g)/ 2 mg  Chicken, 3 oz (85 g) / 1 mg  Fish (tuna, halibut), 3 oz (85 g)/ 1 mg  Pork, 3 oz (85 g)/ 0.9 mg Nonheme Iron Sources Food / Iron (mg)  Ready-to-eat breakfast cereal, iron-fortified / 3.9 to 7 mg  Tofu,  cup / 3.4 mg  Kidney beans,  cup / 2.6 mg  Baked potato with skin / 2.7 mg  Asparagus,  cup / 2.2 mg  Avocado / 2 mg  Dried peaches,  cup / 1.6 mg  Raisins,  cup / 1.5 mg  Soy milk, 1 cup  / 1.5 mg  Whole-wheat bread, 1 slice / 1.2 mg  Spinach, 1 cup / 0.8 mg  Broccoli,  cup / 0.6 mg IRON ABSORPTION Certain foods can decrease the body's absorption of iron. Try to avoid these foods and beverages while eating meals with iron-containing foods:  Coffee.  Tea.  Fiber.  Soy. Foods containing vitamin C can help increase the amount of iron your body absorbs from iron sources, especially from nonheme sources. Eat foods with vitamin C along with iron-containing foods to increase your iron absorption. Foods that are high in vitamin C include many fruits and vegetables. Some good sources are:  Fresh orange juice.  Oranges.  Strawberries.  Mangoes.  Grapefruit.  Red bell peppers.  Green bell peppers.  Broccoli.  Potatoes with skin.  Tomato juice. Document Released: 03/28/2005 Document Revised: 11/06/2011 Document Reviewed: 02/02/2011 Exit[employer] Patient Information 2015 [employer]. This information is not intended to replace advice given to you by your health care provider. Make sure you discuss any questions you have with your health care provider.  

## 2015-05-07 NOTE — Progress Notes (Signed)
POSTOPERATIVE DAY # 3 S/P CS   S:         Reports feeling sore / headaches and neck tightness and pain in shoulders             Tolerating po intake / no nausea / no vomiting / + flatus / no BM             Bleeding is light             Pain controlled withtramadol and oxycodone             Up ad lib / ambulatory/ voiding QS  Newborn ok for DC home - breast feeding    O:  VS: BP 120/66 mmHg  Pulse 92  Temp(Src) 98 F (36.7 C) (Oral)  Resp 18  Ht 5\' 5"  (1.651 m)  Wt 102.513 kg (226 lb)  BMI 37.61 kg/m2  SpO2 100%  Breastfeeding? Unknown   LABS:               Recent Labs  05/05/15 0510  WBC 9.7  HGB 6.8*  PLT 116*               Bloodtype: --/--/AB POS, AB POS (09/06 0145)  Rubella: Immune (02/03 0000)                                 Physical Exam:             Alert and Oriented X3  Abdomen: soft, non-tender, non-distended             Fundus: firm, non-tender, Ueven             Dressing intact honeycomb              Incision:  approximated with suture / no erythema / no ecchymosis / dried drainage  Perineum: intact  Lochia: light  Extremities: trace edema, no calf pain or tenderness  A:        POD # 3 S/P CS            ABL anemia             Hx migraine and tension headache -                          Muscle pain in neck& shoulders versus gas pain  P:        Routine postoperative care              DC home             Iron and magnesium x 6 weeks / increase water hydration / REST at home                        Warm heat to neck and shoulders - will add flexeril to relax muscle tension                 [name] [name] CNM, MSN, FACNM 05/07/2015, 1:12 PM

## 2015-05-07 NOTE — Lactation Note (Signed)
This note was copied from the chart of [place]. Lactation Consultation Note  Patient Name: Boy Sophonie Goforth [id number] Date: 05/07/2015 Reason for consult: Follow-up assessment;NICU baby NICU baby 46 hours old. Mom states that baby will be discharged home today. Mom also reports that her breasts are starting to fill. Mom reports that she has a personal pump. Mom has experience breastfeeding her first child. Discussed putting baby to breast first, then supplementing with EBM/formula as needed, then post-pumping. Enc mom to supplement with EBM as able. Mom aware of OP/BFSG and [place] phone line assistance as needed.   Maternal Data    Feeding Feeding Type: Breast Milk with Formula added Nipple Type: Regular Length of feed: 25 min  LATCH Score/Interventions                      Lactation Tools Discussed/Used     Consult Status Consult Status: PRN    Inocente Salles 05/07/2015, 12:48 PM

## 2015-07-06 ENCOUNTER — Observation Stay (HOSPITAL_COMMUNITY)
Admission: EM | Admit: 2015-07-06 | Discharge: 2015-07-08 | Disposition: A | Discharge status: Home / Self Care | Payer: 59 | Attending: General Surgery | Admitting: General Surgery

## 2015-07-06 ENCOUNTER — Encounter (HOSPITAL_COMMUNITY): Payer: Self-pay | Admitting: Emergency Medicine

## 2015-07-06 DIAGNOSIS — K8000 Calculus of gallbladder with acute cholecystitis without obstruction: Secondary | ICD-10-CM | POA: Diagnosis present

## 2015-07-06 DIAGNOSIS — K8050 Calculus of bile duct without cholangitis or cholecystitis without obstruction: Secondary | ICD-10-CM | POA: Diagnosis present

## 2015-07-06 DIAGNOSIS — D649 Anemia, unspecified: Secondary | ICD-10-CM | POA: Insufficient documentation

## 2015-07-06 DIAGNOSIS — Z87891 Personal history of nicotine dependence: Secondary | ICD-10-CM | POA: Diagnosis not present

## 2015-07-06 DIAGNOSIS — F329 Major depressive disorder, single episode, unspecified: Secondary | ICD-10-CM | POA: Insufficient documentation

## 2015-07-06 DIAGNOSIS — K819 Cholecystitis, unspecified: Secondary | ICD-10-CM

## 2015-07-06 DIAGNOSIS — K8012 Calculus of gallbladder with acute and chronic cholecystitis without obstruction: Secondary | ICD-10-CM | POA: Diagnosis not present

## 2015-07-06 DIAGNOSIS — F909 Attention-deficit hyperactivity disorder, unspecified type: Secondary | ICD-10-CM | POA: Diagnosis not present

## 2015-07-06 DIAGNOSIS — F419 Anxiety disorder, unspecified: Secondary | ICD-10-CM | POA: Diagnosis not present

## 2015-07-06 HISTORY — DX: Attention-deficit hyperactivity disorder, unspecified type: F90.9

## 2015-07-06 NOTE — ED Provider Notes (Signed)
CSN: [id number]     Arrival date & time 07/06/15  2346 History   By signing my name below, I, Forrestine Him, attest that this documentation has been prepared under the direction and in the presence of Charlesetta Shanks, MD.  Electronically Signed: Forrestine Him, ED Scribe. 07/06/2015. 12:03 AM.   Chief Complaint  Patient presents with  . Back Pain   HPI  HPI Comments: TINLEIGH WHITMIRE is a 34 y.o. female without any pertinent past medical history who presents to the Emergency Department complaining of constant, ongoing mid thoracic back pain that radiates to the epigastrium, acute onset this evening at approximately 9:00 PM. Pain is described as shooting/throbbing/burning. No recent injury or trauma. Patient was seen at urgent care last week for a similar episode, she reports that the pain improved and she had been started on a prednisone course for presumed back strain. No aggravating or alleviating factors a this time. No OTC medications or home remedies attempted prior to arrival. No recent fever, chills, cough, or diarrhea. Ms. Stemmer denies any previous issues with her gallbladder. No previous abdominal surgeries.  PCP: Nance Pear., NP    Past Medical History  Diagnosis Date  . Headache(784.0)     migraine  . Depression     weaned off meds during pregnancy  . History of condyloma acuminatum   . History of chicken pox   . Anxiety   . Gestational diabetes     glyburide   . Maternal iron deficiency anemia 05/05/2015  . ADHD (attention deficit hyperactivity disorder)    Past Surgical History  Procedure Laterality Date  . Knee surgery      L meniscus tear  . Cesarean section  06/25/2011    Procedure: CESAREAN SECTION;  Surgeon: Marvene Staff, MD;  Location: [facility];  Service: Gynecology;  Laterality: N/A;  primary of baby girl  at  104 APGAR 8/9  . Cesarean section N/A 05/04/2015    Procedure: CESAREAN SECTION;  Surgeon: Aloha Gell, MD;  Location: [facility];  Service:  Obstetrics;  Laterality: N/A;   Family History  Problem Relation Age of Onset  . Anesthesia problems Neg Hx   . Hypotension Neg Hx   . Malignant hyperthermia Neg Hx   . Pseudochol deficiency Neg Hx    Social History  Substance Use Topics  . Smoking status: Former Smoker    Quit date: 10/18/2010  . Smokeless tobacco: Never Used  . Alcohol Use: No   OB History    Gravida Para Term Preterm AB TAB SAB Ectopic Multiple Living   3 2 2  1 1    0 2     Review of Systems  A complete 10 system review of systems was obtained and all systems are negative except as noted in the HPI and PMH.    Allergies  Ibuprofen; Food; Latex; and Penicillins  Home Medications   Prior to Admission medications   Medication Sig Start Date End Date Taking? Authorizing Provider  ALPRAZolam Duanne Moron) 0.5 MG tablet Take 0.5 mg by mouth 4 (four) times daily as needed. 06/07/15  Yes Historical Provider, MD  amphetamine-dextroamphetamine (ADDERALL) 20 MG tablet Take 20 mg by mouth 3 (three) times daily.   Yes Historical Provider, MD  MICROGESTIN FE 1/20 1-20 MG-MCG tablet Take 1 tablet by mouth daily. 06/17/15  Yes Historical Provider, MD  iron polysaccharides (NIFEREX) 150 MG capsule Take 1 capsule (150 mg total) by mouth 2 (two) times daily. Patient not taking: Reported on 07/07/2015  05/07/15   Artelia Laroche, CNM  Magnesium 200 MG TABS Take 1 tablet (200 mg total) by mouth 2 (two) times daily. Patient not taking: Reported on 07/07/2015 05/07/15   Artelia Laroche, CNM  oxyCODONE-acetaminophen (PERCOCET/ROXICET) 5-325 MG per tablet Take 1-2 tablets by mouth every 4 (four) hours as needed for moderate pain or severe pain. Patient not taking: Reported on 07/07/2015 05/07/15   Artelia Laroche, CNM  traMADol (ULTRAM) 50 MG tablet Take 1 tablet (50 mg total) by mouth every 6 (six) hours as needed (for pain). Patient not taking: Reported on 07/07/2015 05/07/15   Artelia Laroche, CNM   Triage Vitals: BP 115/48 mmHg  Pulse 75  Temp(Src)  97.4 F (36.3 C) (Oral)  Resp 20  SpO2 100%  LMP 07/01/2015  Breastfeeding? Yes   Physical Exam  Constitutional: She is oriented to person, place, and time.  Patient appears to be in moderate to severe pain. She is lying in the lateral decubitus position and appears to have difficulty finding a comfortable position. No respiratory distress and nontoxic.  HENT:  Head: Normocephalic and atraumatic.  Eyes: EOM are normal. No scleral icterus.  Neck: Neck supple.  Cardiovascular: Normal rate, regular rhythm, normal heart sounds and intact distal pulses.   Pulmonary/Chest: Effort normal and breath sounds normal. No respiratory distress. She has no wheezes. She has no rales. She exhibits no tenderness.  Abdominal: Soft. There is tenderness.  Moderate epigastric and right upper quadrant tenderness to palpation. Lower abdomen is nontender.  Musculoskeletal: Normal range of motion. She exhibits no edema or tenderness.  Neurological: She is alert and oriented to person, place, and time. She exhibits normal muscle tone. Coordination normal.  Skin: Skin is warm and dry.  Psychiatric: She has a normal mood and affect.    ED Course  Procedures (including critical care time)  DIAGNOSTIC STUDIES: Oxygen Saturation is 100% on RA, Normal by my interpretation.    COORDINATION OF CARE: 11:58 PM- Will order urinalysis, pregnancy urine, CMP, Lipase, and CBC. Will give Dilaudid, Protonix, Zofran, and fluids. Discussed treatment plan with pt at bedside and pt agreed to plan.     Labs Review Labs Reviewed  COMPREHENSIVE METABOLIC PANEL - Abnormal; Notable for the following:    Potassium 3.3 (*)    Glucose, Bld 130 (*)    Calcium 8.6 (*)    Total Protein 6.3 (*)    Total Bilirubin 0.2 (*)    All other components within normal limits  CBC WITH DIFFERENTIAL/PLATELET - Abnormal; Notable for the following:    Hemoglobin 9.9 (*)    HCT 32.1 (*)    MCV 77.0 (*)    MCH 23.7 (*)    RDW 15.9 (*)    All  other components within normal limits  URINALYSIS, ROUTINE W REFLEX MICROSCOPIC (NOT AT [facility]) - Abnormal; Notable for the following:    Color, Urine STRAW (*)    APPearance CLOUDY (*)    Leukocytes, UA SMALL (*)    All other components within normal limits  URINE MICROSCOPIC-ADD ON - Abnormal; Notable for the following:    Squamous Epithelial / LPF FEW (*)    Bacteria, UA FEW (*)    All other components within normal limits  LIPASE, BLOOD  POC URINE PREG, ED    Imaging Review US Abdomen Limited  07/07/2015  CLINICAL DATA:  Rule out biliary colic.  Abdominal pain EXAM: US ABDOMEN LIMITED - RIGHT UPPER QUADRANT COMPARISON:  None. FINDINGS: Gallbladder: There are multiple gallstones. The  gallbladder is full and focally tender per sonographer exam. Mild wall thickening with fluid along the gallbladder hepatic interface. Common bile duct: Diameter: 5 mm. There is question of mild central intrahepatic ductal enlargement. Liver: No focal lesion identified. Within normal limits in parenchymal echogenicity. Antegrade flow in the imaged portal venous system. IMPRESSION: 1. Cholelithiasis with positive Murphy sign and mild pericholecystic edema. These findings could reflect early acute cholecystitis. 2. Borderline intrahepatic bile duct dilatation. Correlate with pending liver function tests. Electronically Signed   By: Monte Fantasia M.D.   On: 07/07/2015 01:04   I have personally reviewed and evaluated these images and lab results as part of my medical decision-making.   EKG Interpretation None     Consult: Patient's case reviewed Dr. Molli Posey of general surgery. Will evaluate patient in the emergency department. MDM   Final diagnoses:  Biliary colic  Cholecystitis   Patient is approximately 2 months postpartum with onset of biliary colic. Ultrasound shows some wall edema suggestive of cholecystitis. Patient is nontoxic and alert. Pain is improved by Dilaudid but not eliminated. She will be  evaluated by general surgery in the emergency department.  Charlesetta Shanks, MD 07/07/15 [phone]

## 2015-07-06 NOTE — ED Notes (Signed)
Pt. reports mid back pain onset 9 pm , denies injury or fall , pt. added emesis x3 this evening .

## 2015-07-06 NOTE — ED Notes (Signed)
Dr. Johnney Killian to see and assess patient before RN assessment. See MD assessment.

## 2015-07-07 ENCOUNTER — Emergency Department (HOSPITAL_COMMUNITY): Payer: 59

## 2015-07-07 ENCOUNTER — Observation Stay (HOSPITAL_COMMUNITY): Payer: 59 | Admitting: Certified Registered Nurse Anesthetist

## 2015-07-07 ENCOUNTER — Encounter (HOSPITAL_COMMUNITY)
Admission: EM | Disposition: A | Discharge status: Home / Self Care | Payer: Self-pay | Source: Home / Self Care | Attending: Emergency Medicine

## 2015-07-07 ENCOUNTER — Encounter (HOSPITAL_COMMUNITY): Payer: Self-pay

## 2015-07-07 DIAGNOSIS — K8000 Calculus of gallbladder with acute cholecystitis without obstruction: Secondary | ICD-10-CM | POA: Diagnosis present

## 2015-07-07 HISTORY — PX: CHOLECYSTECTOMY: SHX55

## 2015-07-07 LAB — CBC WITH DIFFERENTIAL/PLATELET
BASOS ABS: 0 10*3/uL (ref 0.0–0.1)
Basophils Relative: 0 %
EOS PCT: 3 %
Eosinophils Absolute: 0.2 10*3/uL (ref 0.0–0.7)
HEMATOCRIT: 32.1 % — AB (ref 36.0–46.0)
Hemoglobin: 9.9 g/dL — ABNORMAL LOW (ref 12.0–15.0)
LYMPHS PCT: 32 %
Lymphs Abs: 2.8 10*3/uL (ref 0.7–4.0)
MCH: 23.7 pg — ABNORMAL LOW (ref 26.0–34.0)
MCHC: 30.8 g/dL (ref 30.0–36.0)
MCV: 77 fL — AB (ref 78.0–100.0)
Monocytes Absolute: 0.3 10*3/uL (ref 0.1–1.0)
Monocytes Relative: 4 %
NEUTROS ABS: 5.4 10*3/uL (ref 1.7–7.7)
NEUTROS PCT: 61 %
PLATELETS: 312 10*3/uL (ref 150–400)
RBC: 4.17 MIL/uL (ref 3.87–5.11)
RDW: 15.9 % — ABNORMAL HIGH (ref 11.5–15.5)
WBC: 8.8 10*3/uL (ref 4.0–10.5)

## 2015-07-07 LAB — URINALYSIS, ROUTINE W REFLEX MICROSCOPIC
Bilirubin Urine: NEGATIVE
Glucose, UA: NEGATIVE mg/dL
Hgb urine dipstick: NEGATIVE
KETONES UR: NEGATIVE mg/dL
NITRITE: NEGATIVE
PH: 8 (ref 5.0–8.0)
Protein, ur: NEGATIVE mg/dL
Specific Gravity, Urine: 1.016 (ref 1.005–1.030)
Urobilinogen, UA: 0.2 mg/dL (ref 0.0–1.0)

## 2015-07-07 LAB — COMPREHENSIVE METABOLIC PANEL
ALT: 32 U/L (ref 14–54)
ANION GAP: 9 (ref 5–15)
AST: 18 U/L (ref 15–41)
Albumin: 3.6 g/dL (ref 3.5–5.0)
Alkaline Phosphatase: 48 U/L (ref 38–126)
BUN: 7 mg/dL (ref 6–20)
CHLORIDE: 103 mmol/L (ref 101–111)
CO2: 25 mmol/L (ref 22–32)
Calcium: 8.6 mg/dL — ABNORMAL LOW (ref 8.9–10.3)
Creatinine, Ser: 0.79 mg/dL (ref 0.44–1.00)
Glucose, Bld: 130 mg/dL — ABNORMAL HIGH (ref 65–99)
POTASSIUM: 3.3 mmol/L — AB (ref 3.5–5.1)
Sodium: 137 mmol/L (ref 135–145)
Total Bilirubin: 0.2 mg/dL — ABNORMAL LOW (ref 0.3–1.2)
Total Protein: 6.3 g/dL — ABNORMAL LOW (ref 6.5–8.1)

## 2015-07-07 LAB — URINE MICROSCOPIC-ADD ON

## 2015-07-07 LAB — LIPASE, BLOOD: LIPASE: 32 U/L (ref 11–51)

## 2015-07-07 LAB — POC URINE PREG, ED: Preg Test, Ur: NEGATIVE

## 2015-07-07 SURGERY — LAPAROSCOPIC CHOLECYSTECTOMY
Anesthesia: General | Site: Abdomen

## 2015-07-07 SURGERY — LAPAROSCOPIC CHOLECYSTECTOMY WITH INTRAOPERATIVE CHOLANGIOGRAM
Anesthesia: General

## 2015-07-07 MED ORDER — DEXAMETHASONE SODIUM PHOSPHATE 10 MG/ML IJ SOLN
INTRAMUSCULAR | Status: DC | PRN
  Administered 2015-07-07: 10 mg via INTRAVENOUS

## 2015-07-07 MED ORDER — SODIUM CHLORIDE 0.9 % IV SOLN
INTRAVENOUS | Status: DC
Start: 2015-07-07 — End: 2015-07-08
  Administered 2015-07-07: 05:00:00 via INTRAVENOUS
  Administered 2015-07-08: 100 mL/h via INTRAVENOUS

## 2015-07-07 MED ORDER — HYDROMORPHONE HCL 1 MG/ML IJ SOLN
1.5000 mg | INTRAMUSCULAR | Status: DC | PRN
  Administered 2015-07-07 (×2): 1.5 mg via INTRAVENOUS
  Filled 2015-07-07 (×2): qty 2

## 2015-07-07 MED ORDER — HYDROMORPHONE HCL 1 MG/ML IJ SOLN
1.0000 mg | INTRAMUSCULAR | Status: DC | PRN
  Administered 2015-07-07: 1 mg via INTRAVENOUS
  Filled 2015-07-07 (×2): qty 1

## 2015-07-07 MED ORDER — SUGAMMADEX SODIUM 200 MG/2ML IV SOLN
INTRAVENOUS | Status: DC | PRN
  Administered 2015-07-07: 200 mg via INTRAVENOUS

## 2015-07-07 MED ORDER — SODIUM CHLORIDE 0.9 % IV SOLN
1000.0000 mL | INTRAVENOUS | Status: DC
  Administered 2015-07-07: 13:00:00 via INTRAVENOUS
  Administered 2015-07-07: 1000 mL via INTRAVENOUS

## 2015-07-07 MED ORDER — SUGAMMADEX SODIUM 200 MG/2ML IV SOLN
INTRAVENOUS | Status: AC
  Filled 2015-07-07: qty 2

## 2015-07-07 MED ORDER — DIPHENHYDRAMINE HCL 50 MG/ML IJ SOLN
25.0000 mg | Freq: Four times a day (QID) | INTRAMUSCULAR | Status: DC | PRN
  Administered 2015-07-08: 25 mg via INTRAVENOUS
  Filled 2015-07-07 (×2): qty 1

## 2015-07-07 MED ORDER — ONDANSETRON HCL 4 MG/2ML IJ SOLN: 4.0000 mg | Freq: Once | INTRAMUSCULAR | Status: DC | PRN

## 2015-07-07 MED ORDER — CEFTRIAXONE SODIUM 2 G IJ SOLR
2.0000 g | INTRAMUSCULAR | Status: DC
  Administered 2015-07-07: 2 g via INTRAVENOUS
  Filled 2015-07-07 (×2): qty 2

## 2015-07-07 MED ORDER — PROPOFOL 10 MG/ML IV BOLUS
INTRAVENOUS | Status: DC | PRN
  Administered 2015-07-07: 160 mg via INTRAVENOUS

## 2015-07-07 MED ORDER — SCOPOLAMINE 1 MG/3DAYS TD PT72
1.0000 | MEDICATED_PATCH | TRANSDERMAL | Status: DC
  Administered 2015-07-07: 1.5 mg via TRANSDERMAL

## 2015-07-07 MED ORDER — FENTANYL CITRATE (PF) 100 MCG/2ML IJ SOLN
25.0000 ug | INTRAMUSCULAR | Status: DC | PRN
  Administered 2015-07-07: 50 ug via INTRAVENOUS
  Administered 2015-07-07 (×2): 25 ug via INTRAVENOUS

## 2015-07-07 MED ORDER — DIPHENHYDRAMINE HCL 25 MG PO CAPS
25.0000 mg | ORAL_CAPSULE | Freq: Four times a day (QID) | ORAL | Status: DC | PRN
  Filled 2015-07-07: qty 1

## 2015-07-07 MED ORDER — BUPIVACAINE-EPINEPHRINE 0.25% -1:200000 IJ SOLN
INTRAMUSCULAR | Status: DC | PRN
  Administered 2015-07-07: 16 mL

## 2015-07-07 MED ORDER — SODIUM CHLORIDE 0.9 % IV SOLN
1000.0000 mL | Freq: Once | INTRAVENOUS | Status: AC
  Administered 2015-07-07: 1000 mL via INTRAVENOUS

## 2015-07-07 MED ORDER — ONDANSETRON HCL 4 MG/2ML IJ SOLN
4.0000 mg | Freq: Once | INTRAMUSCULAR | Status: AC
  Administered 2015-07-07: 4 mg via INTRAVENOUS
  Filled 2015-07-07: qty 2

## 2015-07-07 MED ORDER — ONDANSETRON HCL 4 MG/2ML IJ SOLN: 4.0000 mg | Freq: Four times a day (QID) | INTRAMUSCULAR | Status: DC | PRN

## 2015-07-07 MED ORDER — LACTATED RINGERS IV SOLN
INTRAVENOUS | Status: DC
  Administered 2015-07-07 (×3): via INTRAVENOUS

## 2015-07-07 MED ORDER — PHENYLEPHRINE 40 MCG/ML (10ML) SYRINGE FOR IV PUSH (FOR BLOOD PRESSURE SUPPORT)
PREFILLED_SYRINGE | INTRAVENOUS | Status: AC
  Filled 2015-07-07: qty 10

## 2015-07-07 MED ORDER — ROCURONIUM BROMIDE 100 MG/10ML IV SOLN
INTRAVENOUS | Status: DC | PRN
  Administered 2015-07-07: 45 mg via INTRAVENOUS

## 2015-07-07 MED ORDER — FENTANYL CITRATE (PF) 250 MCG/5ML IJ SOLN
INTRAMUSCULAR | Status: AC
  Filled 2015-07-07: qty 5

## 2015-07-07 MED ORDER — ENOXAPARIN SODIUM 40 MG/0.4ML ~~LOC~~ SOLN
40.0000 mg | SUBCUTANEOUS | Status: DC
  Administered 2015-07-08: 40 mg via SUBCUTANEOUS
  Filled 2015-07-07: qty 0.4

## 2015-07-07 MED ORDER — SODIUM CHLORIDE 0.9 % IV SOLN: 1000.0000 mL | Freq: Once | INTRAVENOUS | Status: DC

## 2015-07-07 MED ORDER — HYDROMORPHONE HCL 1 MG/ML IJ SOLN
1.0000 mg | Freq: Once | INTRAMUSCULAR | Status: AC
  Administered 2015-07-07: 1 mg via INTRAVENOUS
  Filled 2015-07-07: qty 1

## 2015-07-07 MED ORDER — MIDAZOLAM HCL 5 MG/5ML IJ SOLN
INTRAMUSCULAR | Status: DC | PRN
  Administered 2015-07-07: 2 mg via INTRAVENOUS

## 2015-07-07 MED ORDER — HYDROMORPHONE HCL 1 MG/ML PO LIQD: 1.0000 mg | ORAL | Status: DC | PRN

## 2015-07-07 MED ORDER — SODIUM CHLORIDE 0.9 % IR SOLN
Status: DC | PRN
  Administered 2015-07-07: 1000 mL

## 2015-07-07 MED ORDER — OXYCODONE HCL 5 MG PO TABS
10.0000 mg | ORAL_TABLET | ORAL | Status: DC | PRN
  Administered 2015-07-07 – 2015-07-08 (×4): 10 mg via ORAL
  Filled 2015-07-07 (×4): qty 2

## 2015-07-07 MED ORDER — HYDROMORPHONE HCL 1 MG/ML IJ SOLN
1.0000 mg | Freq: Once | INTRAMUSCULAR | Status: AC
  Administered 2015-07-07: 1 mg via INTRAVENOUS

## 2015-07-07 MED ORDER — FENTANYL CITRATE (PF) 100 MCG/2ML IJ SOLN
INTRAMUSCULAR | Status: AC
  Filled 2015-07-07: qty 2

## 2015-07-07 MED ORDER — MIDAZOLAM HCL 2 MG/2ML IJ SOLN
INTRAMUSCULAR | Status: AC
  Filled 2015-07-07: qty 4

## 2015-07-07 MED ORDER — ONDANSETRON 4 MG PO TBDP: 4.0000 mg | ORAL_TABLET | Freq: Four times a day (QID) | ORAL | Status: DC | PRN

## 2015-07-07 MED ORDER — HYDROMORPHONE HCL 1 MG/ML IJ SOLN
1.0000 mg | INTRAMUSCULAR | Status: DC | PRN
  Administered 2015-07-07 – 2015-07-08 (×5): 1 mg via INTRAVENOUS
  Filled 2015-07-07 (×5): qty 1

## 2015-07-07 MED ORDER — ONDANSETRON HCL 4 MG/2ML IJ SOLN
INTRAMUSCULAR | Status: DC | PRN
  Administered 2015-07-07: 4 mg via INTRAVENOUS

## 2015-07-07 MED ORDER — BUPIVACAINE-EPINEPHRINE (PF) 0.25% -1:200000 IJ SOLN
INTRAMUSCULAR | Status: AC
  Filled 2015-07-07: qty 30

## 2015-07-07 MED ORDER — PANTOPRAZOLE SODIUM 40 MG IV SOLR
40.0000 mg | Freq: Once | INTRAVENOUS | Status: AC
  Administered 2015-07-07: 40 mg via INTRAVENOUS
  Filled 2015-07-07: qty 40

## 2015-07-07 MED ORDER — ZOLPIDEM TARTRATE 5 MG PO TABS
10.0000 mg | ORAL_TABLET | Freq: Once | ORAL | Status: AC
  Administered 2015-07-07: 10 mg via ORAL
  Filled 2015-07-07: qty 2

## 2015-07-07 MED ORDER — 0.9 % SODIUM CHLORIDE (POUR BTL) OPTIME
TOPICAL | Status: DC | PRN
  Administered 2015-07-07: 1000 mL

## 2015-07-07 MED ORDER — SCOPOLAMINE 1 MG/3DAYS TD PT72
MEDICATED_PATCH | TRANSDERMAL | Status: AC
  Filled 2015-07-07: qty 1

## 2015-07-07 MED ORDER — LIDOCAINE HCL (CARDIAC) 20 MG/ML IV SOLN
INTRAVENOUS | Status: DC | PRN
  Administered 2015-07-07: 80 mg via INTRAVENOUS

## 2015-07-07 MED ORDER — FENTANYL CITRATE (PF) 100 MCG/2ML IJ SOLN
INTRAMUSCULAR | Status: DC | PRN
  Administered 2015-07-07 (×2): 50 ug via INTRAVENOUS
  Administered 2015-07-07: 100 ug via INTRAVENOUS

## 2015-07-07 MED ORDER — LIDOCAINE HCL (CARDIAC) 20 MG/ML IV SOLN
INTRAVENOUS | Status: AC
  Filled 2015-07-07: qty 5

## 2015-07-07 SURGICAL SUPPLY — 38 items
APPLIER CLIP 5 13 M/L LIGAMAX5 (MISCELLANEOUS) ×3
BANDAGE ADH SHEER 1  50/CT (GAUZE/BANDAGES/DRESSINGS) ×12 IMPLANT
BENZOIN TINCTURE PRP APPL 2/3 (GAUZE/BANDAGES/DRESSINGS) ×3 IMPLANT
BLADE SURG ROTATE 9660 (MISCELLANEOUS) IMPLANT
CANISTER SUCTION 2500CC (MISCELLANEOUS) ×3 IMPLANT
CHLORAPREP W/TINT 26ML (MISCELLANEOUS) ×3 IMPLANT
CLIP APPLIE 5 13 M/L LIGAMAX5 (MISCELLANEOUS) ×2 IMPLANT
CLSR STERI-STRIP ANTIMIC 1/2X4 (GAUZE/BANDAGES/DRESSINGS) ×3 IMPLANT
COVER MAYO STAND STRL (DRAPES) ×3 IMPLANT
COVER SURGICAL LIGHT HANDLE (MISCELLANEOUS) ×3 IMPLANT
DEVICE TROCAR PUNCTURE CLOSURE (ENDOMECHANICALS) ×3 IMPLANT
DRAPE C-ARM 42X72 X-RAY (DRAPES) ×3 IMPLANT
ELECT REM PT RETURN 9FT ADLT (ELECTROSURGICAL) ×3
ELECTRODE REM PT RTRN 9FT ADLT (ELECTROSURGICAL) ×2 IMPLANT
GLOVE BIOGEL PI IND STRL 7.0 (GLOVE) ×2 IMPLANT
GLOVE BIOGEL PI INDICATOR 7.0 (GLOVE) ×1
GLOVE SURG SS PI 7.0 STRL IVOR (GLOVE) ×3 IMPLANT
GOWN STRL REUS W/ TWL LRG LVL3 (GOWN DISPOSABLE) ×6 IMPLANT
GOWN STRL REUS W/TWL LRG LVL3 (GOWN DISPOSABLE) ×3
KIT BASIN OR (CUSTOM PROCEDURE TRAY) ×3 IMPLANT
KIT ROOM TURNOVER OR (KITS) ×3 IMPLANT
NS IRRIG 1000ML POUR BTL (IV SOLUTION) ×3 IMPLANT
PAD ARMBOARD 7.5X6 YLW CONV (MISCELLANEOUS) ×3 IMPLANT
POUCH RETRIEVAL ECOSAC 10 (ENDOMECHANICALS) ×2 IMPLANT
POUCH RETRIEVAL ECOSAC 10MM (ENDOMECHANICALS) ×1
SCISSORS LAP 5X35 DISP (ENDOMECHANICALS) ×3 IMPLANT
SET CHOLANGIOGRAPH 5 50 .035 (SET/KITS/TRAYS/PACK) ×3 IMPLANT
SET IRRIG TUBING LAPAROSCOPIC (IRRIGATION / IRRIGATOR) ×3 IMPLANT
SLEEVE ENDOPATH XCEL 5M (ENDOMECHANICALS) ×6 IMPLANT
SPECIMEN JAR SMALL (MISCELLANEOUS) ×3 IMPLANT
STRIP CLOSURE SKIN 1/2X4 (GAUZE/BANDAGES/DRESSINGS) ×3 IMPLANT
SUT MNCRL AB 4-0 PS2 18 (SUTURE) ×3 IMPLANT
TOWEL OR 17X24 6PK STRL BLUE (TOWEL DISPOSABLE) ×3 IMPLANT
TOWEL OR 17X26 10 PK STRL BLUE (TOWEL DISPOSABLE) ×3 IMPLANT
TRAY LAPAROSCOPIC MC (CUSTOM PROCEDURE TRAY) ×3 IMPLANT
TROCAR XCEL BLUNT TIP 100MML (ENDOMECHANICALS) ×3 IMPLANT
TROCAR XCEL NON-BLD 5MMX100MML (ENDOMECHANICALS) ×3 IMPLANT
TUBING INSUFFLATION (TUBING) ×3 IMPLANT

## 2015-07-07 NOTE — ED Notes (Signed)
Admitting MD at bedside.

## 2015-07-07 NOTE — Transfer of Care (Signed)
Immediate Anesthesia Transfer of Care Note  Patient: [name] [name]  Procedure(s) Performed: Procedure(s): LAPAROSCOPIC CHOLECYSTECTOMY (N/A)  Patient Location: PACU  Anesthesia Type:General  Level of Consciousness: awake, alert , oriented and patient cooperative  Airway & Oxygen Therapy: Patient Spontanous Breathing and Patient connected to nasal cannula oxygen  Post-op Assessment: Report given to RN, Post -op Vital signs reviewed and stable and Patient moving all extremities X 4  Post vital signs: Reviewed and stable  Last Vitals:  Filed Vitals:   07/07/15 0421  BP: 129/79  Pulse: 66  Temp: 36.4 C  Resp: 18    Complications: No apparent anesthesia complications

## 2015-07-07 NOTE — Progress Notes (Signed)
Lunch relief by MA Tacey Dimaggio RN 

## 2015-07-07 NOTE — Anesthesia Preprocedure Evaluation (Addendum)
Anesthesia Evaluation  Patient identified by MRN, date of birth, ID band Patient awake    Reviewed: Allergy & Precautions, NPO status , Patient's Chart, lab work & pertinent test results  History of Anesthesia Complications (+) PONV and history of anesthetic complications  Airway Mallampati: III  TM Distance: >3 FB Neck ROM: Full    Dental no notable dental hx. (+) Dental Advisory Given, Teeth Intact   Pulmonary former smoker,    Pulmonary exam normal breath sounds clear to auscultation       Cardiovascular hypertension, Normal cardiovascular exam Rhythm:Regular Rate:Normal     Neuro/Psych  Headaches, PSYCHIATRIC DISORDERS Anxiety Depression    GI/Hepatic negative GI ROS, Neg liver ROS,   Endo/Other  diabetesobesity  Renal/GU negative Renal ROS  negative genitourinary   Musculoskeletal negative musculoskeletal ROS (+)   Abdominal (+) + obese,   Peds negative pediatric ROS (+)  Hematology negative hematology ROS (+) anemia ,   Anesthesia Other Findings   Reproductive/Obstetrics negative OB ROS                         Anesthesia Physical Anesthesia Plan  ASA: II  Anesthesia Plan: General   Post-op Pain Management:    Induction: Intravenous  Airway Management Planned: Oral ETT  Additional Equipment:   Intra-op Plan:   Post-operative Plan: Extubation in OR  Informed Consent: I have reviewed the patients History and Physical, chart, labs and discussed the procedure including the risks, benefits and alternatives for the proposed anesthesia with the patient or authorized representative who has indicated his/her understanding and acceptance.   Dental advisory given  Plan Discussed with: CRNA, Anesthesiologist and Surgeon  Anesthesia Plan Comments:        Anesthesia Quick Evaluation

## 2015-07-07 NOTE — Anesthesia Postprocedure Evaluation (Signed)
  Anesthesia Post-op Note  Patient: [name] [name]  Procedure(s) Performed: Procedure(s) (LRB): LAPAROSCOPIC CHOLECYSTECTOMY (N/A)  Patient Location: PACU  Anesthesia Type: General  Level of Consciousness: awake and alert   Airway and Oxygen Therapy: Patient Spontanous Breathing  Post-op Pain: mild  Post-op Assessment: Post-op Vital signs reviewed, Patient's Cardiovascular Status Stable, Respiratory Function Stable, Patent Airway and No signs of Nausea or vomiting  Last Vitals:  Filed Vitals:   07/07/15 1422  BP: 114/70  Pulse: 71  Temp:   Resp: 15    Post-op Vital Signs: stable   Complications: No apparent anesthesia complications

## 2015-07-07 NOTE — Anesthesia Procedure Notes (Signed)
Procedure Name: Intubation Date/Time: 07/07/2015 12:45 PM Performed by: Carney Living Pre-anesthesia Checklist: Patient identified, Emergency Drugs available, Suction available, Patient being monitored and Timeout performed Patient Re-evaluated:Patient Re-evaluated prior to inductionOxygen Delivery Method: Circle system utilized Preoxygenation: Pre-oxygenation with 100% oxygen Intubation Type: IV induction Ventilation: Mask ventilation without difficulty and Oral airway inserted - appropriate to patient size Laryngoscope Size: Mac and 4 Grade View: Grade I Tube type: Oral Tube size: 7.0 mm Number of attempts: 1 Airway Equipment and Method: Stylet Placement Confirmation: ETT inserted through vocal cords under direct vision,  positive ETCO2 and breath sounds checked- equal and bilateral Secured at: 22 cm Tube secured with: Tape Dental Injury: Teeth and Oropharynx as per pre-operative assessment

## 2015-07-07 NOTE — H&P (Signed)
[name] [name] is an 33 y.o. female.   Chief Complaint: RUQ abdominal pain HPI: This is a 33 yo female who is two months s/p c-section who presents with acute onset of RUQ abdominal pain associated with nausea and vomiting.  She had a similar episode last week as well as a month ago, but this is the most severe episode.  The pain radiates around to her back.  She presented for evaluation and was found to have acute cholecystitis.  Past Medical History  Diagnosis Date  . Headache(784.0)     migraine  . Depression     weaned off meds during pregnancy  . History of condyloma acuminatum   . History of chicken pox   . Anxiety   . Gestational diabetes     glyburide   . Maternal iron deficiency anemia 05/05/2015  . ADHD (attention deficit hyperactivity disorder)     Past Surgical History  Procedure Laterality Date  . Knee surgery      L meniscus tear  . Cesarean section  06/25/2011    Procedure: CESAREAN SECTION;  Surgeon: Sheronette A Cousins, MD;  Location: [facility];  Service: Gynecology;  Laterality: N/A;  primary of baby girl  at  0245 APGAR 8/9  . Cesarean section N/A 05/04/2015    Procedure: CESAREAN SECTION;  Surgeon: Kelly Fogleman, MD;  Location: [facility];  Service: Obstetrics;  Laterality: N/A;    Family History  Problem Relation Age of Onset  . Anesthesia problems Neg Hx   . Hypotension Neg Hx   . Malignant hyperthermia Neg Hx   . Pseudochol deficiency Neg Hx    Social History:  reports that she quit smoking about 4 years ago. She has never used smokeless tobacco. She reports that she does not drink alcohol or use illicit drugs.  Allergies:  Allergies  Allergen Reactions  . Ibuprofen Hives and Other (See Comments)    stuffiness  . Food     Nuts and Yeast  . Latex Hives  . Penicillins Hives    Prior to Admission medications   Medication Sig Start Date End Date Taking? Authorizing Provider  ALPRAZolam (XANAX) 0.5 MG tablet Take 0.5 mg by mouth 4 (four) times daily as  needed. 06/07/15  Yes Historical Provider, MD  amphetamine-dextroamphetamine (ADDERALL) 20 MG tablet Take 20 mg by mouth 3 (three) times daily.   Yes Historical Provider, MD  MICROGESTIN FE 1/20 1-20 MG-MCG tablet Take 1 tablet by mouth daily. 06/17/15  Yes Historical Provider, MD  iron polysaccharides (NIFEREX) 150 MG capsule Take 1 capsule (150 mg total) by mouth 2 (two) times daily. Patient not taking: Reported on 07/07/2015 05/07/15   Tanya Bailey, CNM  Magnesium 200 MG TABS Take 1 tablet (200 mg total) by mouth 2 (two) times daily. Patient not taking: Reported on 07/07/2015 05/07/15   Tanya Bailey, CNM  oxyCODONE-acetaminophen (PERCOCET/ROXICET) 5-325 MG per tablet Take 1-2 tablets by mouth every 4 (four) hours as needed for moderate pain or severe pain. Patient not taking: Reported on 07/07/2015 05/07/15   Tanya Bailey, CNM  traMADol (ULTRAM) 50 MG tablet Take 1 tablet (50 mg total) by mouth every 6 (six) hours as needed (for pain). Patient not taking: Reported on 07/07/2015 05/07/15   Tanya Bailey, CNM     Results for orders placed or performed during the hospital encounter of 07/06/15 (from the past 48 hour(s))  Comprehensive metabolic panel     Status: Abnormal   Collection Time: 07/07/15 12:16 AM  Result   Value Ref Range   Sodium 137 135 - 145 mmol/L   Potassium 3.3 (L) 3.5 - 5.1 mmol/L   Chloride 103 101 - 111 mmol/L   CO2 25 22 - 32 mmol/L   Glucose, Bld 130 (H) 65 - 99 mg/dL   BUN 7 6 - 20 mg/dL   Creatinine, Ser 0.79 0.44 - 1.00 mg/dL   Calcium 8.6 (L) 8.9 - 10.3 mg/dL   Total Protein 6.3 (L) 6.5 - 8.1 g/dL   Albumin 3.6 3.5 - 5.0 g/dL   AST 18 15 - 41 U/L   ALT 32 14 - 54 U/L   Alkaline Phosphatase 48 38 - 126 U/L   Total Bilirubin 0.2 (L) 0.3 - 1.2 mg/dL   GFR calc non Af Amer >60 >60 mL/min   GFR calc Af Amer >60 >60 mL/min    Comment: (NOTE) The eGFR has been calculated using the CKD EPI equation. This calculation has not been validated in all clinical situations. eGFR's  persistently <60 mL/min signify possible Chronic Kidney Disease.    Anion gap 9 5 - 15  Lipase, blood     Status: None   Collection Time: 07/07/15 12:16 AM  Result Value Ref Range   Lipase 32 11 - 51 U/L  CBC with Differential     Status: Abnormal   Collection Time: 07/07/15 12:16 AM  Result Value Ref Range   WBC 8.8 4.0 - 10.5 K/uL   RBC 4.17 3.87 - 5.11 MIL/uL   Hemoglobin 9.9 (L) 12.0 - 15.0 g/dL   HCT 32.1 (L) 36.0 - 46.0 %   MCV 77.0 (L) 78.0 - 100.0 fL   MCH 23.7 (L) 26.0 - 34.0 pg   MCHC 30.8 30.0 - 36.0 g/dL   RDW 15.9 (H) 11.5 - 15.5 %   Platelets 312 150 - 400 K/uL   Neutrophils Relative % 61 %   Neutro Abs 5.4 1.7 - 7.7 K/uL   Lymphocytes Relative 32 %   Lymphs Abs 2.8 0.7 - 4.0 K/uL   Monocytes Relative 4 %   Monocytes Absolute 0.3 0.1 - 1.0 K/uL   Eosinophils Relative 3 %   Eosinophils Absolute 0.2 0.0 - 0.7 K/uL   Basophils Relative 0 %   Basophils Absolute 0.0 0.0 - 0.1 K/uL  Urinalysis, Routine w reflex microscopic     Status: Abnormal   Collection Time: 07/07/15 12:30 AM  Result Value Ref Range   Color, Urine STRAW (A) YELLOW   APPearance CLOUDY (A) CLEAR   Specific Gravity, Urine 1.016 1.005 - 1.030   pH 8.0 5.0 - 8.0   Glucose, UA NEGATIVE NEGATIVE mg/dL   Hgb urine dipstick NEGATIVE NEGATIVE   Bilirubin Urine NEGATIVE NEGATIVE   Ketones, ur NEGATIVE NEGATIVE mg/dL   Protein, ur NEGATIVE NEGATIVE mg/dL   Urobilinogen, UA 0.2 0.0 - 1.0 mg/dL   Nitrite NEGATIVE NEGATIVE   Leukocytes, UA SMALL (A) NEGATIVE  Urine microscopic-add on     Status: Abnormal   Collection Time: 07/07/15 12:30 AM  Result Value Ref Range   Squamous Epithelial / LPF FEW (A) RARE   WBC, UA 3-6 <3 WBC/hpf   RBC / HPF 0-2 <3 RBC/hpf   Bacteria, UA FEW (A) RARE  POC Urine Pregnancy, ED (do NOT order at [facility])     Status: None   Collection Time: 07/07/15  1:22 AM  Result Value Ref Range   Preg Test, Ur NEGATIVE NEGATIVE    Comment:          THE SENSITIVITY OF THIS METHODOLOGY IS  >24 mIU/mL    Us Abdomen Limited  07/07/2015  CLINICAL DATA:  Rule out biliary colic.  Abdominal pain EXAM: US ABDOMEN LIMITED - RIGHT UPPER QUADRANT COMPARISON:  None. FINDINGS: Gallbladder: There are multiple gallstones. The gallbladder is full and focally tender per sonographer exam. Mild wall thickening with fluid along the gallbladder hepatic interface. Common bile duct: Diameter: 5 mm. There is question of mild central intrahepatic ductal enlargement. Liver: No focal lesion identified. Within normal limits in parenchymal echogenicity. Antegrade flow in the imaged portal venous system. IMPRESSION: 1. Cholelithiasis with positive Murphy sign and mild pericholecystic edema. These findings could reflect early acute cholecystitis. 2. Borderline intrahepatic bile duct dilatation. Correlate with pending liver function tests. Electronically Signed   By: Jonathon  Watts M.D.   On: 07/07/2015 01:04    Review of Systems  Constitutional: Negative for weight loss.  HENT: Negative for ear discharge, ear pain, hearing loss and tinnitus.   Eyes: Negative for blurred vision, double vision, photophobia and pain.  Respiratory: Negative for cough, sputum production and shortness of breath.   Cardiovascular: Negative for chest pain.  Gastrointestinal: Positive for nausea, vomiting and abdominal pain.  Genitourinary: Positive for flank pain. Negative for dysuria, urgency and frequency.  Musculoskeletal: Negative for myalgias, back pain, joint pain, falls and neck pain.  Neurological: Negative for dizziness, tingling, sensory change, focal weakness, loss of consciousness and headaches.  Endo/Heme/Allergies: Does not bruise/bleed easily.  Psychiatric/Behavioral: Negative for depression, memory loss and substance abuse. The patient is not nervous/anxious.     Blood pressure 124/73, pulse 67, temperature 97.4 F (36.3 C), temperature source Oral, resp. rate 20, last menstrual period 07/01/2015, SpO2 97 %,  currently breastfeeding. Physical Exam  WDWN in NAD HEENT:  EOMI, sclera anicteric Neck:  No masses, no thyromegaly Lungs:  CTA bilaterally; normal respiratory effort CV:  Regular rate and rhythm; no murmurs Abd:  +bowel sounds, soft, tender in RUQ around to R flank; no palpable masses Ext:  Well-perfused; no edema Skin:  Warm, dry; no sign of jaundice  Assessment/Plan Acute calculus cholecystitis.  Admit for IV antibiotics, IV hydration. Laparoscopic cholecystectomy later today.  The surgical procedure has been discussed with the patient.  Potential risks, benefits, alternative treatments, and expected outcomes have been explained.  All of the patient's questions at this time have been answered.  The likelihood of reaching the patient's treatment goal is good.  The patient understand the proposed surgical procedure and wishes to proceed.   TSUEI,MATTHEW K. 07/07/2015, 3:11 AM    

## 2015-07-07 NOTE — Op Note (Signed)
Preoperative diagnosis: acute cholecystitis  Postoperative diagnosis: Same   Procedure: laparoscopic cholecystectomy  Surgeon: Gurney Maxin, M.D.  Asst: none  Anesthesia: Gen.   Indications for procedure: [name] [name] is a 34 y.o. female with symptoms of Abdominal pain and RUQ pain consistent with gallbladder disease, Confirmed by Ultrasound.  Description of procedure: The patient was brought into the operative suite, placed supine. Anesthesia was administered with endotracheal tube. Patient was strapped in place and foot board was secured. All pressure points were offloaded by foam padding. The patient was prepped and draped in the usual sterile fashion.  A small incision was made to the right of the umbilicus. A 39mm trocar was inserted into the peritoneal cavity with optical entry. Pneumoperitoneum was applied with high flow low pressure. 2 70mm trocars were placed in the RUQ. A 76mm trocar was placed in the subxiphoid space. All trocars sites were first anesthesized with 0.25% marcaine with epinephrine in the subcutaneous and preperitoneal layers. Next the patient was placed in reverse trendelenberg. The gallbladder was retracted cephalad and lateral. The peritoneum was reflected off the infundibulum working lateral to medial.   The cystic duct and cystic artery were identified and further dissection revealed a critical view.. The cystic duct and cystic artery were doubly clipped and ligated.   The gallbladder was removed off the liver bed with cautery. She had a fair amount of edema in the wall and drained a clear liquid consistent with hydrops of the gallbladder. The Gallbladder was placed in a specimen bag. The gallbladder fossa was irrigated and hemostasis was applied with cautery. The gallbladder was removed via the 42mm trocar. The trocar site had to be dilated therefore a 0 vicryl was used on suture passer to close the fascial defect. Pneumoperitoneum was removed, all trocar were  removed. All incisions were closed with 4-0 monocryl subcuticular stitch. The patient woke from anesthesia and was brought to PACU in stable condition.  Findings: acutely inflamed gallbladder  Specimen: gallbldadder  Blood loss: <50cc  Local anesthesia: 0.25% marcaine w epi  Complications: none  Gurney Maxin, M.D. General, Bariatric, & Minimally Invasive Surgery [facility]

## 2015-07-08 ENCOUNTER — Encounter (HOSPITAL_COMMUNITY): Payer: Self-pay | Admitting: General Surgery

## 2015-07-08 MED ORDER — OXYCODONE HCL 10 MG PO TABS: 5.0000 mg | ORAL_TABLET | ORAL | Status: DC | PRN

## 2015-07-08 NOTE — Discharge Summary (Signed)
Physician Discharge Summary  Patient ID: [name] [name] MRN: [id number] DOB/AGE: September 09, 1980 34 y.o.  Admit date: 07/06/2015 Discharge date: 07/08/2015  Admitting Diagnosis: Acute calculous cholecystitis   Discharge Diagnosis Patient Active Problem List   Diagnosis Date Noted  . Gallbladder calculus with acute cholecystitis 07/07/2015  . Acute blood loss anemia 05/06/2015  . GDM, class A1 05/06/2015  . Maternal iron deficiency anemia 05/05/2015  . Gestational hypertension w/o significant proteinuria in 3rd trimester 05/04/2015  . Postpartum care following cesarean delivery (9/6) 05/04/2015  . Gestational diabetes mellitus, class A2 06/23/2011  . COMMON MIGRAINE 11/22/2009  . ANXIETY DEPRESSION 11/05/2009  . ATTENTION DEFICIT DISORDER 11/05/2009    Consultants none  Imaging: US Abdomen Limited  07/07/2015  CLINICAL DATA:  Rule out biliary colic.  Abdominal pain EXAM: US ABDOMEN LIMITED - RIGHT UPPER QUADRANT COMPARISON:  None. FINDINGS: Gallbladder: There are multiple gallstones. The gallbladder is full and focally tender per sonographer exam. Mild wall thickening with fluid along the gallbladder hepatic interface. Common bile duct: Diameter: 5 mm. There is question of mild central intrahepatic ductal enlargement. Liver: No focal lesion identified. Within normal limits in parenchymal echogenicity. Antegrade flow in the imaged portal venous system. IMPRESSION: 1. Cholelithiasis with positive Murphy sign and mild pericholecystic edema. These findings could reflect early acute cholecystitis. 2. Borderline intrahepatic bile duct dilatation. Correlate with pending liver function tests. Electronically Signed   By: Monte Fantasia M.D.   On: 07/07/2015 01:04    Procedures Laparoscopic cholecystectomy---Dr. Carlus Pavlov Course:  [name] [name] is as 34 year old female s/p c section 3 months ago  who presented to [facility] with RUQ abdominal pain.  Workup showed acute cholecystitis.   Patient was admitted and underwent procedure listed above.  Tolerated procedure well and was transferred to the floor.  Diet was advanced as tolerated.  On POD#1, the patient was voiding well, tolerating diet, ambulating well, pain well controlled, vital signs stable, incisions c/d/i and felt stable for discharge home. Medication risks, benefits and therapeutic alternatives were reviewed with the patient.  She verbalizes understanding.  Patient will follow up in our office in 3 weeks and knows to call with questions or concerns.  Physical Exam: General:  Alert, NAD, pleasant, comfortable Abd:  Soft, ND, mild tenderness, incisions C/D/I    Medication List    STOP taking these medications        oxyCODONE-acetaminophen 5-325 MG tablet  Commonly known as:  PERCOCET/ROXICET      TAKE these medications        ALPRAZolam 0.5 MG tablet  Commonly known as:  XANAX  Take 0.5 mg by mouth 4 (four) times daily as needed.     amphetamine-dextroamphetamine 20 MG tablet  Commonly known as:  ADDERALL  Take 20 mg by mouth 3 (three) times daily.     iron polysaccharides 150 MG capsule  Commonly known as:  NIFEREX  Take 1 capsule (150 mg total) by mouth 2 (two) times daily.     Magnesium 200 MG Tabs  Take 1 tablet (200 mg total) by mouth 2 (two) times daily.     MICROGESTIN FE 1/20 1-20 MG-MCG tablet  Generic drug:  norethindrone-ethinyl estradiol  Take 1 tablet by mouth daily.     Oxycodone HCl 10 MG Tabs  Take 0.5-1 tablets (5-10 mg total) by mouth every 4 (four) hours as needed for moderate pain or severe pain.     traMADol 50 MG tablet  Commonly known as:  Veatrice Bourbon  Take 1 tablet (50 mg total) by mouth every 6 (six) hours as needed (for pain).             Follow-up Information    Follow up with [facility] On 07/28/2015.   Specialty:  General Surgery   Why:  arrive by 8am for a 8:30am post operative check up with Jonni Sanger the physician assistant.   Contact information:    [address] [place] [address] [phone]       Signed: Erby Pian, [facility] [phone]  07/08/2015, 8:55 AM

## 2015-07-08 NOTE — Discharge Instructions (Signed)

## 2015-07-08 NOTE — Progress Notes (Signed)
Discussed discharge summary with patient. Reviewed all medications with patient. Patient received Rx. Patient had suspected allergic reaction to Lovenox injection. Thought it could have been dilaudid but patient stated that she had been receiving Dilaudid periodically with no s/s of allergic reaction. Symptoms consisted of flush reddened face. Allergy noted in chart. Benadryl administered. Patient stable and ready for discharge.

## 2015-08-21 ENCOUNTER — Encounter (HOSPITAL_COMMUNITY): Payer: Self-pay | Admitting: Emergency Medicine

## 2015-08-21 DIAGNOSIS — K8050 Calculus of bile duct without cholangitis or cholecystitis without obstruction: Principal | ICD-10-CM | POA: Diagnosis present

## 2015-08-21 DIAGNOSIS — F17200 Nicotine dependence, unspecified, uncomplicated: Secondary | ICD-10-CM | POA: Diagnosis present

## 2015-08-21 DIAGNOSIS — F329 Major depressive disorder, single episode, unspecified: Secondary | ICD-10-CM | POA: Diagnosis present

## 2015-08-21 DIAGNOSIS — F909 Attention-deficit hyperactivity disorder, unspecified type: Secondary | ICD-10-CM | POA: Diagnosis present

## 2015-08-21 DIAGNOSIS — R1013 Epigastric pain: Secondary | ICD-10-CM | POA: Diagnosis not present

## 2015-08-21 DIAGNOSIS — F418 Other specified anxiety disorders: Secondary | ICD-10-CM | POA: Diagnosis present

## 2015-08-21 DIAGNOSIS — D509 Iron deficiency anemia, unspecified: Secondary | ICD-10-CM | POA: Diagnosis present

## 2015-08-21 LAB — COMPREHENSIVE METABOLIC PANEL
ALK PHOS: 134 U/L — AB (ref 38–126)
ALT: 99 U/L — AB (ref 14–54)
AST: 87 U/L — AB (ref 15–41)
Albumin: 3.6 g/dL (ref 3.5–5.0)
Anion gap: 10 (ref 5–15)
BILIRUBIN TOTAL: 2.4 mg/dL — AB (ref 0.3–1.2)
CO2: 23 mmol/L (ref 22–32)
CREATININE: 0.71 mg/dL (ref 0.44–1.00)
Calcium: 8.8 mg/dL — ABNORMAL LOW (ref 8.9–10.3)
Chloride: 106 mmol/L (ref 101–111)
GFR calc Af Amer: >60 mL/min (ref >60)
Glucose, Bld: 159 mg/dL — ABNORMAL HIGH (ref 65–99)
Potassium: 3.6 mmol/L (ref 3.5–5.1)
Sodium: 139 mmol/L (ref 135–145)
TOTAL PROTEIN: 7.1 g/dL (ref 6.5–8.1)

## 2015-08-21 LAB — URINE MICROSCOPIC-ADD ON

## 2015-08-21 LAB — CBC
HCT: 36.4 % (ref 36.0–46.0)
Hemoglobin: 11.3 g/dL — ABNORMAL LOW (ref 12.0–15.0)
MCH: 24 pg — ABNORMAL LOW (ref 26.0–34.0)
MCHC: 31 g/dL (ref 30.0–36.0)
MCV: 77.3 fL — ABNORMAL LOW (ref 78.0–100.0)
PLATELETS: 255 10*3/uL (ref 150–400)
RBC: 4.71 MIL/uL (ref 3.87–5.11)
RDW: 17.4 % — AB (ref 11.5–15.5)
WBC: 6.3 10*3/uL (ref 4.0–10.5)

## 2015-08-21 LAB — URINALYSIS, ROUTINE W REFLEX MICROSCOPIC
Glucose, UA: NEGATIVE mg/dL
Hgb urine dipstick: NEGATIVE
KETONES UR: 15 mg/dL — AB
NITRITE: NEGATIVE
PH: 7 (ref 5.0–8.0)
PROTEIN: NEGATIVE mg/dL
Specific Gravity, Urine: 1.014 (ref 1.005–1.030)

## 2015-08-21 LAB — POC URINE PREG, ED: Preg Test, Ur: NEGATIVE

## 2015-08-21 LAB — LIPASE, BLOOD: Lipase: 22 U/L (ref 11–51)

## 2015-08-21 MED ORDER — FENTANYL CITRATE (PF) 100 MCG/2ML IJ SOLN
INTRAMUSCULAR | Status: AC
  Administered 2015-08-21: 50 ug
  Filled 2015-08-21: qty 2

## 2015-08-21 NOTE — ED Notes (Signed)
Pt. reports upper abdominal and mid back pain onset today with nausea , denies emesis or diarrhea , no fever , cholecystectomy last 11/92016 .

## 2015-08-21 NOTE — ED Notes (Signed)
Fentanyl intranasal 50 mcg given .

## 2015-08-21 NOTE — ED Notes (Signed)
Pt came to nurse first to report unbearable pain.  This nurse talked with Mortimer Fries, RN triage nurse, pt escorted back to triage for pain medication.

## 2015-08-22 ENCOUNTER — Inpatient Hospital Stay (HOSPITAL_COMMUNITY)
Admission: EM | Admit: 2015-08-22 | Discharge: 2015-08-26 | DRG: 446 | Disposition: A | Discharge status: Home / Self Care | Payer: No Typology Code available for payment source | Attending: Internal Medicine | Admitting: Internal Medicine

## 2015-08-22 ENCOUNTER — Emergency Department (HOSPITAL_COMMUNITY): Payer: No Typology Code available for payment source

## 2015-08-22 ENCOUNTER — Observation Stay (HOSPITAL_COMMUNITY): Payer: No Typology Code available for payment source

## 2015-08-22 DIAGNOSIS — K8050 Calculus of bile duct without cholangitis or cholecystitis without obstruction: Secondary | ICD-10-CM

## 2015-08-22 DIAGNOSIS — R1010 Upper abdominal pain, unspecified: Secondary | ICD-10-CM

## 2015-08-22 DIAGNOSIS — F329 Major depressive disorder, single episode, unspecified: Secondary | ICD-10-CM | POA: Diagnosis present

## 2015-08-22 DIAGNOSIS — F341 Dysthymic disorder: Secondary | ICD-10-CM | POA: Diagnosis not present

## 2015-08-22 DIAGNOSIS — F419 Anxiety disorder, unspecified: Secondary | ICD-10-CM

## 2015-08-22 DIAGNOSIS — R109 Unspecified abdominal pain: Secondary | ICD-10-CM | POA: Diagnosis present

## 2015-08-22 DIAGNOSIS — R17 Unspecified jaundice: Secondary | ICD-10-CM

## 2015-08-22 DIAGNOSIS — F32A Depression, unspecified: Secondary | ICD-10-CM | POA: Diagnosis present

## 2015-08-22 DIAGNOSIS — R7989 Other specified abnormal findings of blood chemistry: Secondary | ICD-10-CM

## 2015-08-22 DIAGNOSIS — D509 Iron deficiency anemia, unspecified: Secondary | ICD-10-CM | POA: Diagnosis present

## 2015-08-22 DIAGNOSIS — R740 Nonspecific elevation of levels of transaminase and lactic acid dehydrogenase [LDH]: Secondary | ICD-10-CM

## 2015-08-22 DIAGNOSIS — R945 Abnormal results of liver function studies: Secondary | ICD-10-CM

## 2015-08-22 DIAGNOSIS — R7401 Elevation of levels of liver transaminase levels: Secondary | ICD-10-CM | POA: Diagnosis present

## 2015-08-22 DIAGNOSIS — F988 Other specified behavioral and emotional disorders with onset usually occurring in childhood and adolescence: Secondary | ICD-10-CM | POA: Diagnosis present

## 2015-08-22 LAB — HEPATIC FUNCTION PANEL
ALBUMIN: 3.5 g/dL (ref 3.5–5.0)
ALT: 134 U/L — ABNORMAL HIGH (ref 14–54)
AST: 112 U/L — ABNORMAL HIGH (ref 15–41)
Alkaline Phosphatase: 165 U/L — ABNORMAL HIGH (ref 38–126)
BILIRUBIN DIRECT: 2.7 mg/dL — AB (ref 0.1–0.5)
Indirect Bilirubin: 1.4 mg/dL — ABNORMAL HIGH (ref 0.3–0.9)
Total Bilirubin: 4.1 mg/dL — ABNORMAL HIGH (ref 0.3–1.2)
Total Protein: 6.9 g/dL (ref 6.5–8.1)

## 2015-08-22 MED ORDER — OXYCODONE HCL 5 MG PO TABS
10.0000 mg | ORAL_TABLET | ORAL | Status: DC | PRN
  Administered 2015-08-24 – 2015-08-25 (×4): 10 mg via ORAL
  Filled 2015-08-22 (×4): qty 2

## 2015-08-22 MED ORDER — HYDROMORPHONE HCL 1 MG/ML IJ SOLN
1.0000 mg | INTRAMUSCULAR | Status: DC | PRN
  Administered 2015-08-22 (×6): 2 mg via INTRAVENOUS
  Administered 2015-08-23: 1 mg via INTRAVENOUS
  Administered 2015-08-23 – 2015-08-24 (×11): 2 mg via INTRAVENOUS
  Filled 2015-08-22 (×19): qty 2

## 2015-08-22 MED ORDER — HYDROMORPHONE HCL 1 MG/ML IJ SOLN
1.0000 mg | INTRAMUSCULAR | Status: DC | PRN
  Administered 2015-08-22 (×2): 1 mg via INTRAVENOUS
  Filled 2015-08-22 (×2): qty 1

## 2015-08-22 MED ORDER — HYDROMORPHONE HCL 1 MG/ML IJ SOLN: 1.0000 mg | INTRAMUSCULAR | Status: DC | PRN

## 2015-08-22 MED ORDER — HYDROMORPHONE HCL 1 MG/ML IJ SOLN
1.0000 mg | Freq: Once | INTRAMUSCULAR | Status: AC
  Administered 2015-08-22: 1 mg via INTRAVENOUS
  Filled 2015-08-22: qty 1

## 2015-08-22 MED ORDER — SODIUM CHLORIDE 0.9 % IV SOLN: INTRAVENOUS | Status: DC

## 2015-08-22 MED ORDER — ONDANSETRON HCL 4 MG/2ML IJ SOLN: 4.0000 mg | Freq: Three times a day (TID) | INTRAMUSCULAR | Status: DC | PRN

## 2015-08-22 MED ORDER — LORAZEPAM 2 MG/ML IJ SOLN
1.0000 mg | Freq: Four times a day (QID) | INTRAMUSCULAR | Status: DC | PRN
  Administered 2015-08-22 (×2): 2 mg via INTRAVENOUS
  Administered 2015-08-22: 1 mg via INTRAVENOUS
  Administered 2015-08-23 – 2015-08-24 (×4): 2 mg via INTRAVENOUS
  Filled 2015-08-22 (×7): qty 1

## 2015-08-22 MED ORDER — SODIUM CHLORIDE 0.9 % IV SOLN
1000.0000 mL | Freq: Once | INTRAVENOUS | Status: AC
  Administered 2015-08-22: 1000 mL via INTRAVENOUS

## 2015-08-22 MED ORDER — OXYCODONE HCL 5 MG PO TABS: 5.0000 mg | ORAL_TABLET | ORAL | Status: DC | PRN

## 2015-08-22 MED ORDER — HYDROMORPHONE HCL 1 MG/ML IJ SOLN
2.0000 mg | INTRAMUSCULAR | Status: AC
  Administered 2015-08-22: 2 mg via INTRAVENOUS
  Filled 2015-08-22: qty 2

## 2015-08-22 MED ORDER — ALPRAZOLAM 0.5 MG PO TABS
0.5000 mg | ORAL_TABLET | Freq: Four times a day (QID) | ORAL | Status: DC | PRN
  Filled 2015-08-22: qty 1

## 2015-08-22 MED ORDER — SODIUM CHLORIDE 0.9 % IV SOLN
INTRAVENOUS | Status: DC
  Administered 2015-08-22 – 2015-08-25 (×3): via INTRAVENOUS

## 2015-08-22 MED ORDER — ONDANSETRON HCL 4 MG PO TABS: 4.0000 mg | ORAL_TABLET | Freq: Four times a day (QID) | ORAL | Status: DC | PRN

## 2015-08-22 MED ORDER — SODIUM CHLORIDE 0.9 % IV SOLN
1000.0000 mL | INTRAVENOUS | Status: DC
  Administered 2015-08-22 – 2015-08-24 (×3): 1000 mL via INTRAVENOUS

## 2015-08-22 MED ORDER — ONDANSETRON HCL 4 MG/2ML IJ SOLN
4.0000 mg | Freq: Once | INTRAMUSCULAR | Status: AC
  Administered 2015-08-22: 4 mg via INTRAVENOUS
  Filled 2015-08-22: qty 2

## 2015-08-22 MED ORDER — NORETHIN ACE-ETH ESTRAD-FE 1-20 MG-MCG PO TABS: 1.0000 | ORAL_TABLET | Freq: Every day | ORAL | Status: DC

## 2015-08-22 MED ORDER — GADOBENATE DIMEGLUMINE 529 MG/ML IV SOLN
17.0000 mL | Freq: Once | INTRAVENOUS | Status: AC | PRN
  Administered 2015-08-22: 17 mL via INTRAVENOUS

## 2015-08-22 MED ORDER — ONDANSETRON HCL 4 MG/2ML IJ SOLN
4.0000 mg | Freq: Four times a day (QID) | INTRAMUSCULAR | Status: DC | PRN
  Administered 2015-08-22 – 2015-08-24 (×2): 4 mg via INTRAVENOUS
  Filled 2015-08-22 (×2): qty 2

## 2015-08-22 NOTE — Progress Notes (Signed)
34 year old lady underwent cholecystectomy last month, presents with oe week of nausea, vomiting and abdominal pain.  Liver function panel minimally elevated. And [place] abd not significant.  MRCP ordered and GI consulted for further eval.  NPO, plan to advance to clear liquids if she can tolerate, pain control and anti emetics.   Hosie Poisson, MD [phone]

## 2015-08-22 NOTE — ED Provider Notes (Signed)
CSN: [id number]     Arrival date & time 08/21/15  1944 History  By signing my name below, I, Randa Evens, attest that this documentation has been prepared under the direction and in the presence of Delora Fuel, MD. Electronically Signed: Randa Evens, ED Scribe. 08/22/2015. 2:52 AM.    Chief Complaint  Patient presents with  . Abdominal Pain  . Back Pain   The history is provided by the patient. No language interpreter was used.   HPI Comments: [name] is a 34 y.o. female who presents to the Emergency Department complaining of abdominal pain onset today at 4 PM. Pt rates the severity of her pain 10/10.  Pt states that the pain is radiating to her back. Pt reports associated chills, nausea and vomiting. Since being in the ED Pt has had fentanyl with no relief. Pt reports cholecystectomy on 07/07/2015. Pt states that this pain feels similar to the pain she was having prior to the cholecystectomy. Pt denies fever or diarrhea. Pt brought discharge summary from hospital she was at which showed she had  HIDA scan and CT of abdomen and pelvis showed no obvious bili obstruction    Past Medical History  Diagnosis Date  . Headache(784.0)     migraine  . Depression     weaned off meds during pregnancy  . History of condyloma acuminatum   . History of chicken pox   . Anxiety   . Gestational diabetes     glyburide   . Maternal iron deficiency anemia 05/05/2015  . ADHD (attention deficit hyperactivity disorder)    Past Surgical History  Procedure Laterality Date  . Knee surgery      L meniscus tear  . Cesarean section  06/25/2011    Procedure: CESAREAN SECTION;  Surgeon: Marvene Staff, MD;  Location: [facility];  Service: Gynecology;  Laterality: N/A;  primary of baby girl  at  63 APGAR 8/9  . Cesarean section N/A 05/04/2015    Procedure: CESAREAN SECTION;  Surgeon: Aloha Gell, MD;  Location: [facility];  Service: Obstetrics;  Laterality: N/A;  . Cholecystectomy N/A 07/07/2015     Procedure: LAPAROSCOPIC CHOLECYSTECTOMY;  Surgeon: Arta Bruce Kinsinger, MD;  Location: [facility];  Service: General;  Laterality: N/A;   Family History  Problem Relation Age of Onset  . Anesthesia problems Neg Hx   . Hypotension Neg Hx   . Malignant hyperthermia Neg Hx   . Pseudochol deficiency Neg Hx    Social History  Substance Use Topics  . Smoking status: Current Some Day Smoker    Last Attempt to Quit: 10/18/2010  . Smokeless tobacco: Never Used  . Alcohol Use: No   OB History    Gravida Para Term Preterm AB TAB SAB Ectopic Multiple Living   3 2 2  1 1    0 2      Review of Systems  Constitutional: Positive for chills. Negative for fever.  Gastrointestinal: Positive for nausea, vomiting and abdominal pain. Negative for diarrhea.  All other systems reviewed and are negative.    Allergies  Ibuprofen; Food; Latex; Lovenox; and Penicillins  Home Medications   Prior to Admission medications   Medication Sig Start Date End Date Taking? Authorizing Provider  ALPRAZolam Duanne Moron) 0.5 MG tablet Take 0.5 mg by mouth 4 (four) times daily as needed for anxiety.  06/07/15  Yes Historical Provider, MD  amphetamine-dextroamphetamine (ADDERALL) 20 MG tablet Take 20 mg by mouth 3 (three) times daily.   Yes Historical  Provider, MD  MICROGESTIN FE 1/20 1-20 MG-MCG tablet Take 1 tablet by mouth daily. 06/17/15  Yes Historical Provider, MD   BP 134/80 mmHg  Pulse 82  Temp(Src) 97.2 F (36.2 C) (Oral)  Resp 18  Ht 5\' 6"  (1.676 m)  Wt 178 lb (80.74 kg)  BMI 28.74 kg/m2  SpO2 96%  LMP    Physical Exam  Constitutional: She is oriented to person, place, and time. She appears well-developed and well-nourished.  writhing in pain  HENT:  Head: Normocephalic and atraumatic.  Eyes: Conjunctivae and EOM are normal.  Neck: Neck supple. No tracheal deviation present.  Cardiovascular: Normal rate.   Pulmonary/Chest: Effort normal. No respiratory distress.  Abdominal:  moderate tenderness  across upper abdomen. Bowel sounds decreased.   Musculoskeletal: Normal range of motion.  Neurological: She is alert and oriented to person, place, and time.  Skin: Skin is warm and dry.  Psychiatric: She has a normal mood and affect. Her behavior is normal.  Nursing note and vitals reviewed.   ED Course  Procedures (including critical care time) DIAGNOSTIC STUDIES: Oxygen Saturation is 93% on RA, adequate by my interpretation.    COORDINATION OF CARE: 1:59 AM-Discussed treatment plan with pt at bedside and pt agreed to plan.     Labs Review Results for orders placed or performed during the hospital encounter of 08/22/15  Culture, blood (Routine X 2) w Reflex to ID Panel  Result Value Ref Range   Specimen Description BLOOD RIGHT ANTECUBITAL    Special Requests BOTTLES DRAWN AEROBIC AND ANAEROBIC 10CCS    Culture NO GROWTH 5 DAYS    Report Status 08/28/2015 FINAL   Culture, blood (Routine X 2) w Reflex to ID Panel  Result Value Ref Range   Specimen Description BLOOD RIGHT ANTECUBITAL    Special Requests BOTTLES DRAWN AEROBIC AND ANAEROBIC 5CCS    Culture NO GROWTH 5 DAYS    Report Status 08/28/2015 FINAL   Lipase, blood  Result Value Ref Range   Lipase 22 11 - 51 U/L  Comprehensive metabolic panel  Result Value Ref Range   Sodium 139 135 - 145 mmol/L   Potassium 3.6 3.5 - 5.1 mmol/L   Chloride 106 101 - 111 mmol/L   CO2 23 22 - 32 mmol/L   Glucose, Bld 159 (H) 65 - 99 mg/dL   BUN <5 (L) 6 - 20 mg/dL   Creatinine, Ser 0.71 0.44 - 1.00 mg/dL   Calcium 8.8 (L) 8.9 - 10.3 mg/dL   Total Protein 7.1 6.5 - 8.1 g/dL   Albumin 3.6 3.5 - 5.0 g/dL   AST 87 (H) 15 - 41 U/L   ALT 99 (H) 14 - 54 U/L   Alkaline Phosphatase 134 (H) 38 - 126 U/L   Total Bilirubin 2.4 (H) 0.3 - 1.2 mg/dL   GFR calc non Af Amer >60 >60 mL/min   GFR calc Af Amer >60 >60 mL/min   Anion gap 10 5 - 15  CBC  Result Value Ref Range   WBC 6.3 4.0 - 10.5 K/uL   RBC 4.71 3.87 - 5.11 MIL/uL   Hemoglobin  11.3 (L) 12.0 - 15.0 g/dL   HCT 36.4 36.0 - 46.0 %   MCV 77.3 (L) 78.0 - 100.0 fL   MCH 24.0 (L) 26.0 - 34.0 pg   MCHC 31.0 30.0 - 36.0 g/dL   RDW 17.4 (H) 11.5 - 15.5 %   Platelets 255 150 - 400 K/uL  Urinalysis, Routine w reflex microscopic (  not at [facility])  Result Value Ref Range   Color, Urine AMBER (A) YELLOW   APPearance CLOUDY (A) CLEAR   Specific Gravity, Urine 1.014 1.005 - 1.030   pH 7.0 5.0 - 8.0   Glucose, UA NEGATIVE NEGATIVE mg/dL   Hgb urine dipstick NEGATIVE NEGATIVE   Bilirubin Urine SMALL (A) NEGATIVE   Ketones, ur 15 (A) NEGATIVE mg/dL   Protein, ur NEGATIVE NEGATIVE mg/dL   Nitrite NEGATIVE NEGATIVE   Leukocytes, UA TRACE (A) NEGATIVE  Urine microscopic-add on  Result Value Ref Range   Squamous Epithelial / LPF 6-30 (A) NONE SEEN   WBC, UA 0-5 0 - 5 WBC/hpf   RBC / HPF 0-5 0 - 5 RBC/hpf   Bacteria, UA RARE (A) NONE SEEN  Hepatic function panel  Result Value Ref Range   Total Protein 6.9 6.5 - 8.1 g/dL   Albumin 3.5 3.5 - 5.0 g/dL   AST 112 (H) 15 - 41 U/L   ALT 134 (H) 14 - 54 U/L   Alkaline Phosphatase 165 (H) 38 - 126 U/L   Total Bilirubin 4.1 (H) 0.3 - 1.2 mg/dL   Bilirubin, Direct 2.7 (H) 0.1 - 0.5 mg/dL   Indirect Bilirubin 1.4 (H) 0.3 - 0.9 mg/dL  Comprehensive metabolic panel  Result Value Ref Range   Sodium 137 135 - 145 mmol/L   Potassium 3.8 3.5 - 5.1 mmol/L   Chloride 102 101 - 111 mmol/L   CO2 25 22 - 32 mmol/L   Glucose, Bld 90 65 - 99 mg/dL   BUN <5 (L) 6 - 20 mg/dL   Creatinine, Ser 0.63 0.44 - 1.00 mg/dL   Calcium 8.3 (L) 8.9 - 10.3 mg/dL   Total Protein 6.4 (L) 6.5 - 8.1 g/dL   Albumin 3.0 (L) 3.5 - 5.0 g/dL   AST 187 (H) 15 - 41 U/L   ALT 204 (H) 14 - 54 U/L   Alkaline Phosphatase 200 (H) 38 - 126 U/L   Total Bilirubin 5.7 (H) 0.3 - 1.2 mg/dL   GFR calc non Af Amer >60 >60 mL/min   GFR calc Af Amer >60 >60 mL/min   Anion gap 10 5 - 15  Lactic acid, plasma  Result Value Ref Range   Lactic Acid, Venous 1.0 0.5 - 2.0 mmol/L   Lactic acid, plasma  Result Value Ref Range   Lactic Acid, Venous 1.0 0.5 - 2.0 mmol/L  CBC  Result Value Ref Range   WBC 8.6 4.0 - 10.5 K/uL   RBC 4.33 3.87 - 5.11 MIL/uL   Hemoglobin 10.7 (L) 12.0 - 15.0 g/dL   HCT 33.5 (L) 36.0 - 46.0 %   MCV 77.4 (L) 78.0 - 100.0 fL   MCH 24.7 (L) 26.0 - 34.0 pg   MCHC 31.9 30.0 - 36.0 g/dL   RDW 18.3 (H) 11.5 - 15.5 %   Platelets 185 150 - 400 K/uL  Comprehensive metabolic panel  Result Value Ref Range   Sodium 141 135 - 145 mmol/L   Potassium 3.3 (L) 3.5 - 5.1 mmol/L   Chloride 106 101 - 111 mmol/L   CO2 27 22 - 32 mmol/L   Glucose, Bld 81 65 - 99 mg/dL   BUN <5 (L) 6 - 20 mg/dL   Creatinine, Ser 0.56 0.44 - 1.00 mg/dL   Calcium 8.4 (L) 8.9 - 10.3 mg/dL   Total Protein 5.6 (L) 6.5 - 8.1 g/dL   Albumin 2.7 (L) 3.5 - 5.0 g/dL   AST 201 (  H) 15 - 41 U/L   ALT 217 (H) 14 - 54 U/L   Alkaline Phosphatase 177 (H) 38 - 126 U/L   Total Bilirubin 5.4 (H) 0.3 - 1.2 mg/dL   GFR calc non Af Amer >60 >60 mL/min   GFR calc Af Amer >60 >60 mL/min   Anion gap 8 5 - 15  CBC  Result Value Ref Range   WBC 4.4 4.0 - 10.5 K/uL   RBC 3.88 3.87 - 5.11 MIL/uL   Hemoglobin 9.5 (L) 12.0 - 15.0 g/dL   HCT 30.0 (L) 36.0 - 46.0 %   MCV 77.3 (L) 78.0 - 100.0 fL   MCH 24.5 (L) 26.0 - 34.0 pg   MCHC 31.7 30.0 - 36.0 g/dL   RDW 18.8 (H) 11.5 - 15.5 %   Platelets 151 150 - 400 K/uL  Comprehensive metabolic panel  Result Value Ref Range   Sodium 140 135 - 145 mmol/L   Potassium 3.7 3.5 - 5.1 mmol/L   Chloride 106 101 - 111 mmol/L   CO2 26 22 - 32 mmol/L   Glucose, Bld 74 65 - 99 mg/dL   BUN <5 (L) 6 - 20 mg/dL   Creatinine, Ser 0.48 0.44 - 1.00 mg/dL   Calcium 8.4 (L) 8.9 - 10.3 mg/dL   Total Protein 5.9 (L) 6.5 - 8.1 g/dL   Albumin 2.7 (L) 3.5 - 5.0 g/dL   AST 140 (H) 15 - 41 U/L   ALT 204 (H) 14 - 54 U/L   Alkaline Phosphatase 176 (H) 38 - 126 U/L   Total Bilirubin 5.2 (H) 0.3 - 1.2 mg/dL   GFR calc non Af Amer >60 >60 mL/min   GFR calc Af Amer >60  >60 mL/min   Anion gap 8 5 - 15  Comprehensive metabolic panel  Result Value Ref Range   Sodium 137 135 - 145 mmol/L   Potassium 4.0 3.5 - 5.1 mmol/L   Chloride 104 101 - 111 mmol/L   CO2 24 22 - 32 mmol/L   Glucose, Bld 86 65 - 99 mg/dL   BUN 5 (L) 6 - 20 mg/dL   Creatinine, Ser 0.60 0.44 - 1.00 mg/dL   Calcium 8.7 (L) 8.9 - 10.3 mg/dL   Total Protein 6.6 6.5 - 8.1 g/dL   Albumin 2.9 (L) 3.5 - 5.0 g/dL   AST 144 (H) 15 - 41 U/L   ALT 203 (H) 14 - 54 U/L   Alkaline Phosphatase 209 (H) 38 - 126 U/L   Total Bilirubin 4.8 (H) 0.3 - 1.2 mg/dL   GFR calc non Af Amer >60 >60 mL/min   GFR calc Af Amer >60 >60 mL/min   Anion gap 9 5 - 15  POC urine preg, ED (not at [facility])  Result Value Ref Range   Preg Test, Ur NEGATIVE NEGATIVE    Imaging Review  US Abdomen Limited  08/22/2015  CLINICAL DATA:  34 year old female with upper abdominal pain, nausea and vomiting. Cholecystectomy. EXAM: US ABDOMEN LIMITED - RIGHT UPPER QUADRANT COMPARISON:  Ultrasound dated 07/07/2015 FINDINGS: Gallbladder: Cholecystectomy. Common bile duct: Diameter: 5 mm Liver: No focal lesion identified. Within normal limits in parenchymal echogenicity. IMPRESSION: Status post cholecystectomy otherwise unremarkable right upper quadrant ultrasound. Electronically Signed   By: Anner Crete M.D.   On: 08/22/2015 02:45    MDM   Final diagnoses:  Upper abdominal pain  Elevated liver function tests  Total bilirubin, elevated      Patient with abdominal pain status  post cholecystectomy. Symptoms were concerning for possible common bile duct stone search was sent for ultrasound which showed no evidence of retained stone. However, transaminases are elevated as is bilirubin. Pain has been difficult to control with multiple injections of hydromorphone. Case is discussed with Dr. Tamala Julian of [facility] who agrees to admit the patient for pain control and investigation of abnormal liver function tests, consideration for  ERCP.   I personally performed the services described in this documentation, which was scribed in my presence. The recorded information has been reviewed and is accurate.       Delora Fuel, MD [phone]

## 2015-08-22 NOTE — H&P (Signed)
[facility] History and Physical  [name] [name] [id number] DOB: [birth year] DOA: 08/22/2015  Referring physician: ED PCP: [name] Pear., NP   Chief Complaint: Abdominal pain  HPI:  [name] [name] is a 34 year old with a past medical history of depression, anxiety, cholecystectomy 6 weeks ago (11/ 9); who presents with epigastric abdominal pain radiating to the back. Symptoms today started around 4 PM. States epigastric pain radiating to the back. Complains of nausea and vomiting. Pain is currently reported as a 10 out of 10 despite 50 mcg of fentanyl and of and 1 mg of Dilaudid IV.    Symptoms initially started 3 days ago (12/24) upon awakening while visiting family in [place]. She reported similar symptoms to prior to getting her gallbladder removed noting a burning epigastric pain that radiated to her back. She was seen at [name] and was evaluated by surgery and gastroenterology. Patient underwent a HIDA scan and CT scan which showed no obvious biliary obstruction. Initial lab work showed a lipase 70, AST124, ALT 106, total bili 2.4. Patient was given the option to have a MRCP but had felt better and decided come back to [name].   Initial lab work done today showed T bili 2.4, AST 87,  ALT 99, lipase 22. Abdominal ultrasound showed no acute abnormalities.     Review of Systems  Constitutional: Positive for chills and malaise/fatigue. Negative for weight loss.  HENT: Negative for ear pain.   Eyes: Negative for pain.  Respiratory: Positive for shortness of breath. Negative for hemoptysis.   Cardiovascular: Negative for chest pain, palpitations and leg swelling.  Gastrointestinal: Positive for nausea, vomiting and abdominal pain.  Genitourinary: Negative for urgency and frequency.  Musculoskeletal: Negative for back pain and neck pain.  Skin: Negative for itching and rash.  Neurological: Positive for headaches. Negative for sensory change.   Endo/Heme/Allergies: Negative for environmental allergies and polydipsia.  Psychiatric/Behavioral: Negative for memory loss. The patient is nervous/anxious.         Past Medical History  Diagnosis Date  . Headache(784.0)     migraine  . Depression     weaned off meds during pregnancy  . History of condyloma acuminatum   . History of chicken pox   . Anxiety   . Gestational diabetes     glyburide   . Maternal iron deficiency anemia 05/05/2015  . ADHD (attention deficit hyperactivity disorder)      Past Surgical History  Procedure Laterality Date  . Knee surgery      L meniscus tear  . Cesarean section  06/25/2011    Procedure: CESAREAN SECTION;  Surgeon: [name] Staff, MD;  Location: [facility];  Service: Gynecology;  Laterality: N/A;  primary of baby girl  at  79 APGAR 8/9  . Cesarean section N/A 05/04/2015    Procedure: CESAREAN SECTION;  Surgeon: [name] Gell, MD;  Location: [facility];  Service: Obstetrics;  Laterality: N/A;  . Cholecystectomy N/A 07/07/2015    Procedure: LAPAROSCOPIC CHOLECYSTECTOMY;  Surgeon: [name] Bruce Kinsinger, MD;  Location: [place];  Service: General;  Laterality: N/A;      Social History:  reports that she has been smoking.  She has never used smokeless tobacco. She reports that she does not drink alcohol or use illicit drugs. Where does patient live--home    Can patient participate in ADLs? Yes  Allergies  Allergen Reactions  . Ibuprofen Hives and Other (See Comments)    stuffiness  . Food     Nuts and  Yeast  . Latex Hives  . Lovenox [Enoxaparin Sodium] Other (See Comments)    Face flush and redness  . Penicillins Hives    Family History  Problem Relation Age of Onset  . Anesthesia problems Neg Hx   . Hypotension Neg Hx   . Malignant hyperthermia Neg Hx   . Pseudochol deficiency Neg Hx         Prior to Admission medications   Medication Sig Start Date End Date Taking? Authorizing [name]  ALPRAZolam [name]) 0.5 MG tablet  Take 0.5 mg by mouth 4 (four) times daily as needed for anxiety.  06/07/15  Yes [name] Provider, MD  amphetamine-dextroamphetamine (ADDERALL) 20 MG tablet Take 20 mg by mouth 3 (three) times daily.   Yes [name] Provider, MD  MICROGESTIN FE 1/20 1-20 MG-MCG tablet Take 1 tablet by mouth daily. 06/17/15  Yes [name] Provider, MD     Physical Exam: Filed Vitals:   08/21/15 2201 08/22/15 0145 08/22/15 0303 08/22/15 0326  BP: 133/97 134/80 141/77 132/76  Pulse: 65 82 61 62  Temp:      TempSrc:      Resp: 18  16 16   Height:      Weight:      SpO2: 93% 96% 99% 99%     Constitutional: Vital signs reviewed. Patient is a well-developed and well-nourished, but appears to be in significant distress thrashing about in bed in pain  Head:  Normocephalic and atraumatic  Ear: TM normal bilaterally  Mouth: no erythema or exudates, MMM  Eyes: PERRL, EOMI, conjunctivae normal, No scleral icterus.  Neck: Supple, Trachea midline normal ROM, No JVD, mass, thyromegaly, or carotid bruit present.  Cardiovascular: RRR, S1 normal, S2 normal, no MRG, pulses symmetric and intact bilaterally  Pulmonary/Chest: CTAB, no wheezes, rales, or rhonchi  Abdominal: .Tenderness to palpation of the epigastric region, no guarding present, cannot appreciate rebound as patient continues to move during exam with complaints of pain.  GU: no CVA tenderness Musculoskeletal: No joint deformities, erythema, or stiffness, ROM full and no nontender Ext: no edema and no cyanosis, pulses palpable bilaterally (DP and PT)  Hematology: no cervical, inginal, or axillary adenopathy.  Neurological: A&O x3, Strenght is normal and symmetric bilaterally, cranial nerve II-XII are grossly intact, no focal motor deficit, sensory intact to light touch bilaterally.  Skin: Warm, dry and intact. No rash, cyanosis, or clubbing.  Psychiatric: Anxious.  speech and behavior is normal. Judgment and thought content normal. Cognition and memory are  normal.      Data Review   Micro Results No results found for this or any previous visit (from the past 240 hour(s)).  Radiology Reports US Abdomen Limited  08/22/2015  CLINICAL DATA:  34 year old female with upper abdominal pain, nausea and vomiting. Cholecystectomy. EXAM: US ABDOMEN LIMITED - RIGHT UPPER QUADRANT COMPARISON:  Ultrasound dated 07/07/2015 FINDINGS: Gallbladder: Cholecystectomy. Common bile duct: Diameter: 5 mm Liver: No focal lesion identified. Within normal limits in parenchymal echogenicity. IMPRESSION: Status post cholecystectomy otherwise unremarkable right upper quadrant ultrasound. Electronically Signed   By: Anner Crete M.D.   On: 08/22/2015 02:45     CBC  Recent Labs Lab 08/21/15 1957  WBC 6.3  HGB 11.3*  HCT 36.4  PLT 255  MCV 77.3*  MCH 24.0*  MCHC 31.0  RDW 17.4*    Chemistries   Recent Labs Lab 08/21/15 1957  NA 139  K 3.6  CL 106  CO2 23  GLUCOSE 159*  BUN <5*  CREATININE 0.71  CALCIUM 8.8*  AST 87*  ALT 99*  ALKPHOS 134*  BILITOT 2.4*   ------------------------------------------------------------------------------------------------------------------ estimated creatinine clearance is 106.2 mL/min (by C-G formula based on Cr of 0.71). ------------------------------------------------------------------------------------------------------------------ No results for input(s): HGBA1C in the last 72 hours. ------------------------------------------------------------------------------------------------------------------ No results for input(s): CHOL, HDL, LDLCALC, TRIG, CHOLHDL, LDLDIRECT in the last 72 hours. ------------------------------------------------------------------------------------------------------------------ No results for input(s): TSH, T4TOTAL, T3FREE, THYROIDAB in the last 72 hours.  Invalid input(s):  FREET3 ------------------------------------------------------------------------------------------------------------------ No results for input(s): VITAMINB12, FOLATE, FERRITIN, TIBC, IRON, RETICCTPCT in the last 72 hours.  Coagulation profile No results for input(s): INR, PROTIME in the last 168 hours.  No results for input(s): DDIMER in the last 72 hours.  Cardiac Enzymes No results for input(s): CKMB, TROPONINI, MYOGLOBIN in the last 168 hours.  Invalid input(s): CK ------------------------------------------------------------------------------------------------------------------ Invalid input(s): POCBNP   CBG: No results for input(s): GLUCAP in the last 168 hours.      Assessment/Plan Principal Problem:   Upper abdominal pain: Patient status post cholecystectomy 6 months ago reporting epigastric pain radiating to the back. Mild transaminitis is seen with normal lipase. Patient has had CT scan, HIDA scan, an abdominal ultrasound without any source of patient's abdominal pain. - Consult GI in a.m. ? Needed MRCP - patient to be NPO except meds -  IV fluids NS -  Pain control  - Family brought records from [name]  Transaminitis:  -Continue to monitor   Microcytic anemia: Stable. Secondary to iron deficiency anemia     ANXIETY/DEPRESSION - continue  Xanax   Code Status:   full Family Communication: bedside Disposition Plan: admit   Total time spent 55 minutes.Greater than 50% of this time was spent in counseling, explanation of diagnosis, planning of further management, and coordination of care  [facility] Pager [phone]  If 7PM-7AM, please contact night-coverage [email or web address] Password [email or web address] 08/22/2015, 4:16 AM

## 2015-08-22 NOTE — Progress Notes (Signed)
Called ED nurse for report. 

## 2015-08-22 NOTE — Progress Notes (Signed)
New Admission Note  Arrival: Via stretcher Mental Orientation: Alert & oriented X 4 Telemetry: Assessment: Non tele Skin: No Skin issues verified by 2nd Noland Fordyce IV: Left AC Pain: C/O of pain. Pain med given as scheduled Safety measures:  verbalized understanding. Bed in lowest position.  Non-skid socks on. Family: Family at bedside  Orders have been reviewed and implemented. Will continue to monitor.

## 2015-08-23 DIAGNOSIS — F909 Attention-deficit hyperactivity disorder, unspecified type: Secondary | ICD-10-CM | POA: Diagnosis not present

## 2015-08-23 DIAGNOSIS — R109 Unspecified abdominal pain: Secondary | ICD-10-CM | POA: Diagnosis present

## 2015-08-23 DIAGNOSIS — K8050 Calculus of bile duct without cholangitis or cholecystitis without obstruction: Secondary | ICD-10-CM | POA: Diagnosis not present

## 2015-08-23 DIAGNOSIS — F17200 Nicotine dependence, unspecified, uncomplicated: Secondary | ICD-10-CM | POA: Diagnosis present

## 2015-08-23 DIAGNOSIS — D509 Iron deficiency anemia, unspecified: Secondary | ICD-10-CM | POA: Diagnosis not present

## 2015-08-23 DIAGNOSIS — R1010 Upper abdominal pain, unspecified: Secondary | ICD-10-CM | POA: Diagnosis not present

## 2015-08-23 DIAGNOSIS — F418 Other specified anxiety disorders: Secondary | ICD-10-CM | POA: Diagnosis present

## 2015-08-23 DIAGNOSIS — R740 Nonspecific elevation of levels of transaminase and lactic acid dehydrogenase [LDH]: Secondary | ICD-10-CM | POA: Diagnosis not present

## 2015-08-23 DIAGNOSIS — R1013 Epigastric pain: Secondary | ICD-10-CM | POA: Diagnosis present

## 2015-08-23 DIAGNOSIS — F329 Major depressive disorder, single episode, unspecified: Secondary | ICD-10-CM | POA: Diagnosis present

## 2015-08-23 LAB — CBC
HCT: 33.5 % — ABNORMAL LOW (ref 36.0–46.0)
Hemoglobin: 10.7 g/dL — ABNORMAL LOW (ref 12.0–15.0)
MCH: 24.7 pg — ABNORMAL LOW (ref 26.0–34.0)
MCHC: 31.9 g/dL (ref 30.0–36.0)
MCV: 77.4 fL — ABNORMAL LOW (ref 78.0–100.0)
PLATELETS: 185 10*3/uL (ref 150–400)
RBC: 4.33 MIL/uL (ref 3.87–5.11)
RDW: 18.3 % — AB (ref 11.5–15.5)
WBC: 8.6 10*3/uL (ref 4.0–10.5)

## 2015-08-23 LAB — COMPREHENSIVE METABOLIC PANEL
ALK PHOS: 200 U/L — AB (ref 38–126)
ALT: 204 U/L — AB (ref 14–54)
ANION GAP: 10 (ref 5–15)
AST: 187 U/L — ABNORMAL HIGH (ref 15–41)
Albumin: 3 g/dL — ABNORMAL LOW (ref 3.5–5.0)
BUN: <5 mg/dL — ABNORMAL LOW (ref 6–20)
CALCIUM: 8.3 mg/dL — AB (ref 8.9–10.3)
CO2: 25 mmol/L (ref 22–32)
CREATININE: 0.63 mg/dL (ref 0.44–1.00)
Chloride: 102 mmol/L (ref 101–111)
Glucose, Bld: 90 mg/dL (ref 65–99)
Potassium: 3.8 mmol/L (ref 3.5–5.1)
SODIUM: 137 mmol/L (ref 135–145)
TOTAL PROTEIN: 6.4 g/dL — AB (ref 6.5–8.1)
Total Bilirubin: 5.7 mg/dL — ABNORMAL HIGH (ref 0.3–1.2)

## 2015-08-23 LAB — LACTIC ACID, PLASMA
LACTIC ACID, VENOUS: 1 mmol/L (ref 0.5–2.0)
LACTIC ACID, VENOUS: 1 mmol/L (ref 0.5–2.0)

## 2015-08-23 MED ORDER — DEXTROSE 5 % IV SOLN: 2.0000 g | INTRAVENOUS | Status: DC

## 2015-08-23 MED ORDER — CIPROFLOXACIN IN D5W 400 MG/200ML IV SOLN
400.0000 mg | Freq: Two times a day (BID) | INTRAVENOUS | Status: DC
  Administered 2015-08-23 – 2015-08-24 (×3): 400 mg via INTRAVENOUS
  Filled 2015-08-23 (×4): qty 200

## 2015-08-23 MED ORDER — DIPHENHYDRAMINE HCL 50 MG/ML IJ SOLN
25.0000 mg | Freq: Four times a day (QID) | INTRAMUSCULAR | Status: DC | PRN
  Administered 2015-08-23 – 2015-08-24 (×3): 25 mg via INTRAVENOUS
  Filled 2015-08-23 (×3): qty 1

## 2015-08-23 MED ORDER — DEXTROSE 5 % IV SOLN
2.0000 g | Freq: Once | INTRAVENOUS | Status: DC
  Filled 2015-08-23: qty 2

## 2015-08-23 MED ORDER — METRONIDAZOLE IN NACL 5-0.79 MG/ML-% IV SOLN
500.0000 mg | Freq: Three times a day (TID) | INTRAVENOUS | Status: DC
  Administered 2015-08-23 – 2015-08-25 (×6): 500 mg via INTRAVENOUS
  Filled 2015-08-23 (×6): qty 100

## 2015-08-23 NOTE — Progress Notes (Signed)
[facility] PROGRESS NOTE  [name] [name] [id number] DOB: Nov 04, 1980 DOA: 08/22/2015 PCP: Nance Pear., NP  Assessment/Plan: CBD dilatation from CBD stone: Admitted, IV hydration, IV pain control.  GI consulted and plan for ERCP.  Worsening liver function panel .   Code Status: full code Family Communication: none at bedside. Disposition Plan: pending.    Consultants:  GI  Procedures: MRCP  Antibiotics:  ciprofloxacin  flagyl  HPI/Subjective: Pain well controlled.  Objective: Filed Vitals:   September 03, 2015 0900 2015/09/03 1725  BP: 125/60 127/72  Pulse: 100 101  Temp: 99 F (37.2 C) 99 F (37.2 C)  Resp: 16 16    Intake/Output Summary (Last 24 hours) at 2015/09/03 1914 Last data filed at 03-Sep-2015 1847  Gross per 24 hour  Intake 628.33 ml  Output   2700 ml  Net -2071.67 ml   Filed Weights   08/21/15 1953 08/22/15 0524  Weight: 80.74 kg (178 lb) 79.833 kg (176 lb)    Exam:   General:  Alert afebrile   Cardiovascular: s1s2  Respiratory: ctab  Abdomen:soft non distended, diffuse, gen tenderness, bs+  Musculoskeletal: no pedal edema  Data Reviewed: Basic Metabolic Panel:  Recent Labs Lab 08/21/15 1957 2015-09-03 0700  NA 139 137  K 3.6 3.8  CL 106 102  CO2 23 25  GLUCOSE 159* 90  BUN <5* <5*  CREATININE 0.71 0.63  CALCIUM 8.8* 8.3*   Liver Function Tests:  Recent Labs Lab 08/21/15 1957 08/22/15 1050 09-03-2015 0700  AST 87* 112* 187*  ALT 99* 134* 204*  ALKPHOS 134* 165* 200*  BILITOT 2.4* 4.1* 5.7*  PROT 7.1 6.9 6.4*  ALBUMIN 3.6 3.5 3.0*    Recent Labs Lab 08/21/15 1957  LIPASE 22   No results for input(s): AMMONIA in the last 168 hours. CBC:  Recent Labs Lab 08/21/15 1957 2015/09/03 0700  WBC 6.3 8.6  HGB 11.3* 10.7*  HCT 36.4 33.5*  MCV 77.3* 77.4*  PLT 255 185   Cardiac Enzymes: No results for input(s): CKTOTAL, CKMB, CKMBINDEX, TROPONINI in the last 168 hours. BNP (last 3 results) No results  for input(s): BNP in the last 8760 hours.  ProBNP (last 3 results) No results for input(s): PROBNP in the last 8760 hours.  CBG: No results for input(s): GLUCAP in the last 168 hours.  No results found for this or any previous visit (from the past 240 hour(s)).   Studies: Mr 3d Recon At Scanner  2015-09-03  CLINICAL DATA:  Epigastric pain with nausea and vomiting. Right seen LFTs. Cholecystectomy 6 weeks ago. EXAM: MRI ABDOMEN WITHOUT AND WITH CONTRAST (INCLUDING MRCP) TECHNIQUE: Multiplanar multisequence MR imaging of the abdomen was performed both before and after the administration of intravenous contrast. Heavily T2-weighted images of the biliary and pancreatic ducts were obtained, and three-dimensional MRCP images were rendered by post processing. CONTRAST:  67mL MULTIHANCE GADOBENATE DIMEGLUMINE 529 MG/ML IV SOLN COMPARISON:  Ultrasound 08/22/2015 and 07/07/2015. FINDINGS: Lower chest:  Unremarkable. Hepatobiliary: Status post cholecystectomy. Interval development of mild intra and extrahepatic biliary dilatation. The common hepatic duct measures up to 12 mm in diameter. The duct tapers distally. No intraductal calculi demonstrated. There is probable mild periportal edema. The liver otherwise appears normal without focal lesion or abnormal enhancement. Pancreas: The pancreas appears normal. There is no evidence of pancreatic mass, pancreatic ductal dilatation or surrounding inflammatory change. The pancreas enhances homogeneously. Spleen: Normal in size without focal abnormality. Adrenals/Urinary Tract: Both adrenal glands appear normal. The kidneys appear normal without evidence  of urinary tract calculus or hydronephrosis. Bladder not imaged. Stomach/Bowel: No evidence of bowel wall thickening, distention or surrounding inflammatory change. Vascular/Lymphatic: There are no enlarged abdominal lymph nodes. No significant vascular findings are present. The portal, superior mesenteric and splenic  veins appear normal. Other: None. Musculoskeletal: No acute or significant osseous findings. IMPRESSION: 1. Interval development of intra and extrahepatic biliary dilatation. No evidence of choledocholithiasis. Findings suggest recent passage of a common duct stone. Continued surveillance of liver function studies recommended. Findings were discussed with Dr. Oletta Lamas. 2. The cholecystectomy bed and liver otherwise appear unremarkable. 3. No evidence of pancreatitis. Electronically Signed   By: Richardean Sale M.D.   On: 08/23/2015 08:34   US Abdomen Limited  08/22/2015  CLINICAL DATA:  34 year old female with upper abdominal pain, nausea and vomiting. Cholecystectomy. EXAM: US ABDOMEN LIMITED - RIGHT UPPER QUADRANT COMPARISON:  Ultrasound dated 07/07/2015 FINDINGS: Gallbladder: Cholecystectomy. Common bile duct: Diameter: 5 mm Liver: No focal lesion identified. Within normal limits in parenchymal echogenicity. IMPRESSION: Status post cholecystectomy otherwise unremarkable right upper quadrant ultrasound. Electronically Signed   By: Anner Crete M.D.   On: 08/22/2015 02:45   Mr Jeananne Rama W/wo Cm/mrcp  08/23/2015  CLINICAL DATA:  Epigastric pain with nausea and vomiting. Right seen LFTs. Cholecystectomy 6 weeks ago. EXAM: MRI ABDOMEN WITHOUT AND WITH CONTRAST (INCLUDING MRCP) TECHNIQUE: Multiplanar multisequence MR imaging of the abdomen was performed both before and after the administration of intravenous contrast. Heavily T2-weighted images of the biliary and pancreatic ducts were obtained, and three-dimensional MRCP images were rendered by post processing. CONTRAST:  64mL MULTIHANCE GADOBENATE DIMEGLUMINE 529 MG/ML IV SOLN COMPARISON:  Ultrasound 08/22/2015 and 07/07/2015. FINDINGS: Lower chest:  Unremarkable. Hepatobiliary: Status post cholecystectomy. Interval development of mild intra and extrahepatic biliary dilatation. The common hepatic duct measures up to 12 mm in diameter. The duct tapers distally. No  intraductal calculi demonstrated. There is probable mild periportal edema. The liver otherwise appears normal without focal lesion or abnormal enhancement. Pancreas: The pancreas appears normal. There is no evidence of pancreatic mass, pancreatic ductal dilatation or surrounding inflammatory change. The pancreas enhances homogeneously. Spleen: Normal in size without focal abnormality. Adrenals/Urinary Tract: Both adrenal glands appear normal. The kidneys appear normal without evidence of urinary tract calculus or hydronephrosis. Bladder not imaged. Stomach/Bowel: No evidence of bowel wall thickening, distention or surrounding inflammatory change. Vascular/Lymphatic: There are no enlarged abdominal lymph nodes. No significant vascular findings are present. The portal, superior mesenteric and splenic veins appear normal. Other: None. Musculoskeletal: No acute or significant osseous findings. IMPRESSION: 1. Interval development of intra and extrahepatic biliary dilatation. No evidence of choledocholithiasis. Findings suggest recent passage of a common duct stone. Continued surveillance of liver function studies recommended. Findings were discussed with Dr. Oletta Lamas. 2. The cholecystectomy bed and liver otherwise appear unremarkable. 3. No evidence of pancreatitis. Electronically Signed   By: Richardean Sale M.D.   On: 08/23/2015 08:34    Scheduled Meds: . ciprofloxacin  400 mg Intravenous Q12H  . metronidazole  500 mg Intravenous 3 times per day   Continuous Infusions: . sodium chloride 1,000 mL (08/23/15 1432)  . sodium chloride Stopped (08/23/15 1217)    Principal Problem:   Upper abdominal pain Active Problems:   ANXIETY DEPRESSION   Microcytic anemia   Transaminitis   Abdominal pain    Time spent: 35 min    [place]  [facility] Pager [phone]. If 7PM-7AM, please contact night-coverage at [email or web address], password [facility] 08/23/2015, 7:14 PM  LOS: 1 day

## 2015-08-23 NOTE — Consult Note (Signed)
[facility] CONS[facility] Reason for consult: Abdominal Pain Referring Physician: [facility]. No PCP primary G.I.: none  [name] [name] is an 34 y.o. female.  HPI: she had cholecystectomy 11/16 and has had several small episodes of discomfort. She had severe pain about 3 week days ago requiring hospitalization in [place]. She was seen by surgery and gastroenterology apparently and CT scan and HIDA scan were reported to be normal liver test were elevated in total bilirubin was 2.4. The patient was discharged home because she felt better and apparently during the ride back to [place] began to have pain again. Pain is severe. Her lab work initially revealed total bilirubin at 2.4 to 5.7 today with normal WBC slight elevation of transaminases 3 to 4 times normal that has risen. Ultrasound showed previous cholecystectomy in CBD 5 mm with no dilated's CBD. MRCP was performed in on this study which was done several hours later the CBD was dilated but no clear stones were seen no signs of pancreatitis. The patient still has some discomfort but possibly feels just a little bit better.  Past Medical History  Diagnosis Date  . Headache(784.0)     migraine  . Depression     weaned off meds during pregnancy  . History of condyloma acuminatum   . History of chicken pox   . Anxiety   . Gestational diabetes     glyburide   . Maternal iron deficiency anemia 05/05/2015  . ADHD (attention deficit hyperactivity disorder)     Past Surgical History  Procedure Laterality Date  . Knee surgery      L meniscus tear  . Cesarean section  06/25/2011    Procedure: CESAREAN SECTION;  Surgeon: Marvene Staff, MD;  Location: [facility];  Service: Gynecology;  Laterality: N/A;  primary of baby girl  at  18 APGAR 8/9  . Cesarean section N/A 05/04/2015    Procedure: CESAREAN SECTION;  Surgeon: Aloha Gell, MD;  Location: [facility];  Service: Obstetrics;  Laterality: N/A;  . Cholecystectomy N/A 07/07/2015     Procedure: LAPAROSCOPIC CHOLECYSTECTOMY;  Surgeon: Arta Bruce Kinsinger, MD;  Location[facility];  Service: General;  Laterality: N/A;    Family History  Problem Relation Age of Onset  . Anesthesia problems Neg Hx   . Hypotension Neg Hx   . Malignant hyperthermia Neg Hx   . Pseudochol deficiency Neg Hx     Social History:  reports that she has been smoking.  She has never used smokeless tobacco. She reports that she does not drink alcohol or use illicit drugs.  Allergies:  Allergies  Allergen Reactions  . Ibuprofen Hives and Other (See Comments)    stuffiness  . Food     Nuts and Yeast  . Latex Hives  . Lovenox [Enoxaparin Sodium] Other (See Comments)    Face flush and redness  . Penicillins Hives    Medications; Prior to Admission medications   Medication Sig Start Date End Date Taking? Authorizing Provider  ALPRAZolam Duanne Moron) 0.5 MG tablet Take 0.5 mg by mouth 4 (four) times daily as needed for anxiety.  06/07/15  Yes Historical Provider, MD  amphetamine-dextroamphetamine (ADDERALL) 20 MG tablet Take 20 mg by mouth 3 (three) times daily.   Yes Historical Provider, MD  MICROGESTIN FE 1/20 1-20 MG-MCG tablet Take 1 tablet by mouth daily. 06/17/15  Yes Historical Provider, MD   . Derrill Memo ON 08/24/2015] cefTRIAXone (ROCEPHIN)  IV  2 g Intravenous Q24H  . cefTRIAXone (ROCEPHIN)  IV  2 g Intravenous Once  . metronidazole  500 mg Intravenous 3 times per day   PRN Meds HYDROmorphone (DILAUDID) injection, LORazepam, ondansetron **OR** ondansetron (ZOFRAN) IV, oxyCODONE Results for orders placed or performed during the hospital encounter of 08/22/15 (from the past 48 hour(s))  Urinalysis, Routine w reflex microscopic (not at [facility])     Status: Abnormal   Collection Time: 08/21/15  7:55 PM  Result Value Ref Range   Color, Urine AMBER (A) YELLOW    Comment: BIOCHEMICALS MAY BE AFFECTED BY COLOR   APPearance CLOUDY (A) CLEAR   Specific Gravity, Urine 1.014 1.005 - 1.030   pH 7.0  5.0 - 8.0   Glucose, UA NEGATIVE NEGATIVE mg/dL   Hgb urine dipstick NEGATIVE NEGATIVE   Bilirubin Urine SMALL (A) NEGATIVE   Ketones, ur 15 (A) NEGATIVE mg/dL   Protein, ur NEGATIVE NEGATIVE mg/dL   Nitrite NEGATIVE NEGATIVE   Leukocytes, UA TRACE (A) NEGATIVE  Urine microscopic-add on     Status: Abnormal   Collection Time: 08/21/15  7:55 PM  Result Value Ref Range   Squamous Epithelial / LPF 6-30 (A) NONE SEEN   WBC, UA 0-5 0 - 5 WBC/hpf   RBC / HPF 0-5 0 - 5 RBC/hpf   Bacteria, UA RARE (A) NONE SEEN  Lipase, blood     Status: None   Collection Time: 08/21/15  7:57 PM  Result Value Ref Range   Lipase 22 11 - 51 U/L  Comprehensive metabolic panel     Status: Abnormal   Collection Time: 08/21/15  7:57 PM  Result Value Ref Range   Sodium 139 135 - 145 mmol/L   Potassium 3.6 3.5 - 5.1 mmol/L   Chloride 106 101 - 111 mmol/L   CO2 23 22 - 32 mmol/L   Glucose, Bld 159 (H) 65 - 99 mg/dL   BUN <5 (L) 6 - 20 mg/dL   Creatinine, Ser 0.71 0.44 - 1.00 mg/dL   Calcium 8.8 (L) 8.9 - 10.3 mg/dL   Total Protein 7.1 6.5 - 8.1 g/dL   Albumin 3.6 3.5 - 5.0 g/dL   AST 87 (H) 15 - 41 U/L   ALT 99 (H) 14 - 54 U/L   Alkaline Phosphatase 134 (H) 38 - 126 U/L   Total Bilirubin 2.4 (H) 0.3 - 1.2 mg/dL   GFR calc non Af Amer >60 >60 mL/min   GFR calc Af Amer >60 >60 mL/min    Comment: (NOTE) The eGFR has been calculated using the CKD EPI equation. This calculation has not been validated in all clinical situations. eGFR's persistently <60 mL/min signify possible Chronic Kidney Disease.    Anion gap 10 5 - 15  CBC     Status: Abnormal   Collection Time: 08/21/15  7:57 PM  Result Value Ref Range   WBC 6.3 4.0 - 10.5 K/uL   RBC 4.71 3.87 - 5.11 MIL/uL   Hemoglobin 11.3 (L) 12.0 - 15.0 g/dL   HCT 36.4 36.0 - 46.0 %   MCV 77.3 (L) 78.0 - 100.0 fL   MCH 24.0 (L) 26.0 - 34.0 pg   MCHC 31.0 30.0 - 36.0 g/dL   RDW 17.4 (H) 11.5 - 15.5 %   Platelets 255 150 - 400 K/uL  POC urine preg, ED (not at  [facility])     Status: None   Collection Time: 08/21/15  8:22 PM  Result Value Ref Range   Preg Test, Ur NEGATIVE NEGATIVE    Comment:  THE SENSITIVITY OF THIS METHODOLOGY IS >24 mIU/mL   Hepatic function panel     Status: Abnormal   Collection Time: 08/22/15 10:50 AM  Result Value Ref Range   Total Protein 6.9 6.5 - 8.1 g/dL   Albumin 3.5 3.5 - 5.0 g/dL   AST 112 (H) 15 - 41 U/L   ALT 134 (H) 14 - 54 U/L   Alkaline Phosphatase 165 (H) 38 - 126 U/L   Total Bilirubin 4.1 (H) 0.3 - 1.2 mg/dL   Bilirubin, Direct 2.7 (H) 0.1 - 0.5 mg/dL   Indirect Bilirubin 1.4 (H) 0.3 - 0.9 mg/dL  Comprehensive metabolic panel     Status: Abnormal   Collection Time: 08/23/15  7:00 AM  Result Value Ref Range   Sodium 137 135 - 145 mmol/L   Potassium 3.8 3.5 - 5.1 mmol/L   Chloride 102 101 - 111 mmol/L   CO2 25 22 - 32 mmol/L   Glucose, Bld 90 65 - 99 mg/dL   BUN <5 (L) 6 - 20 mg/dL   Creatinine, Ser 0.63 0.44 - 1.00 mg/dL   Calcium 8.3 (L) 8.9 - 10.3 mg/dL   Total Protein 6.4 (L) 6.5 - 8.1 g/dL   Albumin 3.0 (L) 3.5 - 5.0 g/dL   AST 187 (H) 15 - 41 U/L   ALT 204 (H) 14 - 54 U/L   Alkaline Phosphatase 200 (H) 38 - 126 U/L   Total Bilirubin 5.7 (H) 0.3 - 1.2 mg/dL   GFR calc non Af Amer >60 >60 mL/min   GFR calc Af Amer >60 >60 mL/min    Comment: (NOTE) The eGFR has been calculated using the CKD EPI equation. This calculation has not been validated in all clinical situations. eGFR's persistently <60 mL/min signify possible Chronic Kidney Disease.    Anion gap 10 5 - 15  Lactic acid, plasma     Status: None   Collection Time: 08/23/15  7:00 AM  Result Value Ref Range   Lactic Acid, Venous 1.0 0.5 - 2.0 mmol/L  CBC     Status: Abnormal   Collection Time: 08/23/15  7:00 AM  Result Value Ref Range   WBC 8.6 4.0 - 10.5 K/uL   RBC 4.33 3.87 - 5.11 MIL/uL   Hemoglobin 10.7 (L) 12.0 - 15.0 g/dL   HCT 33.5 (L) 36.0 - 46.0 %   MCV 77.4 (L) 78.0 - 100.0 fL   MCH 24.7 (L) 26.0 - 34.0 pg   MCHC  31.9 30.0 - 36.0 g/dL   RDW 18.3 (H) 11.5 - 15.5 %   Platelets 185 150 - 400 K/uL    Mr 3d Recon At Scanner  08/23/2015  CLINICAL DATA:  Epigastric pain with nausea and vomiting. Right seen LFTs. Cholecystectomy 6 weeks ago. EXAM: MRI ABDOMEN WITHOUT AND WITH CONTRAST (INCLUDING MRCP) TECHNIQUE: Multiplanar multisequence MR imaging of the abdomen was performed both before and after the administration of intravenous contrast. Heavily T2-weighted images of the biliary and pancreatic ducts were obtained, and three-dimensional MRCP images were rendered by post processing. CONTRAST:  103m MULTIHANCE GADOBENATE DIMEGLUMINE 529 MG/ML IV SOLN COMPARISON:  Ultrasound 08/22/2015 and 07/07/2015. FINDINGS: Lower chest:  Unremarkable. Hepatobiliary: Status post cholecystectomy. Interval development of mild intra and extrahepatic biliary dilatation. The common hepatic duct measures up to 12 mm in diameter. The duct tapers distally. No intraductal calculi demonstrated. There is probable mild periportal edema. The liver otherwise appears normal without focal lesion or abnormal enhancement. Pancreas: The pancreas appears normal. There is  no evidence of pancreatic mass, pancreatic ductal dilatation or surrounding inflammatory change. The pancreas enhances homogeneously. Spleen: Normal in size without focal abnormality. Adrenals/Urinary Tract: Both adrenal glands appear normal. The kidneys appear normal without evidence of urinary tract calculus or hydronephrosis. Bladder not imaged. Stomach/Bowel: No evidence of bowel wall thickening, distention or surrounding inflammatory change. Vascular/Lymphatic: There are no enlarged abdominal lymph nodes. No significant vascular findings are present. The portal, superior mesenteric and splenic veins appear normal. Other: None. Musculoskeletal: No acute or significant osseous findings. IMPRESSION: 1. Interval development of intra and extrahepatic biliary dilatation. No evidence of  choledocholithiasis. Findings suggest recent passage of a common duct stone. Continued surveillance of liver function studies recommended. Findings were discussed with Dr. Oletta Lamas. 2. The cholecystectomy bed and liver otherwise appear unremarkable. 3. No evidence of pancreatitis. Electronically Signed   By: Richardean Sale M.D.   On: 08/23/2015 08:34   US Abdomen Limited  08/22/2015  CLINICAL DATA:  34 year old female with upper abdominal pain, nausea and vomiting. Cholecystectomy. EXAM: US ABDOMEN LIMITED - RIGHT UPPER QUADRANT COMPARISON:  Ultrasound dated 07/07/2015 FINDINGS: Gallbladder: Cholecystectomy. Common bile duct: Diameter: 5 mm Liver: No focal lesion identified. Within normal limits in parenchymal echogenicity. IMPRESSION: Status post cholecystectomy otherwise unremarkable right upper quadrant ultrasound. Electronically Signed   By: Anner Crete M.D.   On: 08/22/2015 02:45   Mr Jeananne Rama W/wo Cm/mrcp  08/23/2015  CLINICAL DATA:  Epigastric pain with nausea and vomiting. Right seen LFTs. Cholecystectomy 6 weeks ago. EXAM: MRI ABDOMEN WITHOUT AND WITH CONTRAST (INCLUDING MRCP) TECHNIQUE: Multiplanar multisequence MR imaging of the abdomen was performed both before and after the administration of intravenous contrast. Heavily T2-weighted images of the biliary and pancreatic ducts were obtained, and three-dimensional MRCP images were rendered by post processing. CONTRAST:  69m MULTIHANCE GADOBENATE DIMEGLUMINE 529 MG/ML IV SOLN COMPARISON:  Ultrasound 08/22/2015 and 07/07/2015. FINDINGS: Lower chest:  Unremarkable. Hepatobiliary: Status post cholecystectomy. Interval development of mild intra and extrahepatic biliary dilatation. The common hepatic duct measures up to 12 mm in diameter. The duct tapers distally. No intraductal calculi demonstrated. There is probable mild periportal edema. The liver otherwise appears normal without focal lesion or abnormal enhancement. Pancreas: The pancreas appears  normal. There is no evidence of pancreatic mass, pancreatic ductal dilatation or surrounding inflammatory change. The pancreas enhances homogeneously. Spleen: Normal in size without focal abnormality. Adrenals/Urinary Tract: Both adrenal glands appear normal. The kidneys appear normal without evidence of urinary tract calculus or hydronephrosis. Bladder not imaged. Stomach/Bowel: No evidence of bowel wall thickening, distention or surrounding inflammatory change. Vascular/Lymphatic: There are no enlarged abdominal lymph nodes. No significant vascular findings are present. The portal, superior mesenteric and splenic veins appear normal. Other: None. Musculoskeletal: No acute or significant osseous findings. IMPRESSION: 1. Interval development of intra and extrahepatic biliary dilatation. No evidence of choledocholithiasis. Findings suggest recent passage of a common duct stone. Continued surveillance of liver function studies recommended. Findings were discussed with Dr. EOletta Lamas 2. The cholecystectomy bed and liver otherwise appear unremarkable. 3. No evidence of pancreatitis. Electronically Signed   By: WRichardean SaleM.D.   On: 08/23/2015 08:34              Blood pressure 116/66, pulse 112, temperature 100.2 F (37.9 C), temperature source Oral, resp. rate 15, height 5' 6"  (1.676 m), weight 79.833 kg (176 lb), SpO2 96 %, currently breastfeeding.  Physical exam:   General-- white female in no acute distress ENT-- slightly w/o Neck-- lymphadenopathy without Heart-- regular  rate and rhythm without murmurs or gallops Lungs-- clear Abdomen-- nondistended with mild right upper quadrant epigastric tenderness Psych alert and oriented and appropriate   Assessment: 1. Probable CBD stone. Increased dilatation on MRCP but no clear stone could a pastor could be floating in the bile duct. I think ERCP to clear the bile duct is certainly appropriate and the patient is very anxious to have this done  given the fact she's been hospitalized twice in the past couple days in 2 different locations for this pain.  Plan: I would check labs in the morning. We will give her ice chips and ginger ale and will tentatively plan on scheduling ERCP with sphincterotomy and stone extraction tomorrow with Dr. Paulita Fujita. The risk of pancreatitis and bleeding discussed. Questions answered. The patient is anxious to go ahead and get this cleared up.   Henretter Piekarski JR,Rikki Smestad L 08/23/2015, 9:05 AM   Pager: [phone] If no answer or after hours call [phone]

## 2015-08-24 ENCOUNTER — Encounter (HOSPITAL_COMMUNITY): Payer: Self-pay | Admitting: *Deleted

## 2015-08-24 ENCOUNTER — Inpatient Hospital Stay (HOSPITAL_COMMUNITY): Payer: No Typology Code available for payment source | Admitting: Certified Registered Nurse Anesthetist

## 2015-08-24 ENCOUNTER — Inpatient Hospital Stay (HOSPITAL_COMMUNITY): Payer: No Typology Code available for payment source

## 2015-08-24 ENCOUNTER — Encounter (HOSPITAL_COMMUNITY)
Admission: EM | Disposition: A | Discharge status: Home / Self Care | Payer: Self-pay | Source: Home / Self Care | Attending: Internal Medicine

## 2015-08-24 HISTORY — PX: ERCP: SHX5425

## 2015-08-24 LAB — COMPREHENSIVE METABOLIC PANEL
ALBUMIN: 2.7 g/dL — AB (ref 3.5–5.0)
ALK PHOS: 177 U/L — AB (ref 38–126)
ALT: 217 U/L — AB (ref 14–54)
AST: 201 U/L — ABNORMAL HIGH (ref 15–41)
Anion gap: 8 (ref 5–15)
BILIRUBIN TOTAL: 5.4 mg/dL — AB (ref 0.3–1.2)
BUN: <5 mg/dL — ABNORMAL LOW (ref 6–20)
CALCIUM: 8.4 mg/dL — AB (ref 8.9–10.3)
CO2: 27 mmol/L (ref 22–32)
CREATININE: 0.56 mg/dL (ref 0.44–1.00)
Chloride: 106 mmol/L (ref 101–111)
GFR calc non Af Amer: >60 mL/min (ref >60)
GLUCOSE: 81 mg/dL (ref 65–99)
Potassium: 3.3 mmol/L — ABNORMAL LOW (ref 3.5–5.1)
SODIUM: 141 mmol/L (ref 135–145)
Total Protein: 5.6 g/dL — ABNORMAL LOW (ref 6.5–8.1)

## 2015-08-24 LAB — CBC
HCT: 30 % — ABNORMAL LOW (ref 36.0–46.0)
Hemoglobin: 9.5 g/dL — ABNORMAL LOW (ref 12.0–15.0)
MCH: 24.5 pg — AB (ref 26.0–34.0)
MCHC: 31.7 g/dL (ref 30.0–36.0)
MCV: 77.3 fL — ABNORMAL LOW (ref 78.0–100.0)
PLATELETS: 151 10*3/uL (ref 150–400)
RBC: 3.88 MIL/uL (ref 3.87–5.11)
RDW: 18.8 % — ABNORMAL HIGH (ref 11.5–15.5)
WBC: 4.4 10*3/uL (ref 4.0–10.5)

## 2015-08-24 SURGERY — ERCP, WITH INTERVENTION IF INDICATED
Anesthesia: General

## 2015-08-24 MED ORDER — POTASSIUM CHLORIDE CRYS ER 20 MEQ PO TBCR
40.0000 meq | EXTENDED_RELEASE_TABLET | Freq: Two times a day (BID) | ORAL | Status: AC
  Administered 2015-08-24 – 2015-08-25 (×2): 40 meq via ORAL
  Filled 2015-08-24 (×2): qty 2

## 2015-08-24 MED ORDER — SODIUM CHLORIDE 0.9 % IV SOLN
INTRAVENOUS | Status: DC | PRN
  Administered 2015-08-24: 15 mL

## 2015-08-24 MED ORDER — PROPOFOL 10 MG/ML IV BOLUS
INTRAVENOUS | Status: DC | PRN
  Administered 2015-08-24: 140 mg via INTRAVENOUS

## 2015-08-24 MED ORDER — FENTANYL CITRATE (PF) 100 MCG/2ML IJ SOLN
INTRAMUSCULAR | Status: DC | PRN
  Administered 2015-08-24: 150 ug via INTRAVENOUS

## 2015-08-24 MED ORDER — SUCCINYLCHOLINE CHLORIDE 20 MG/ML IJ SOLN
INTRAMUSCULAR | Status: DC | PRN
  Administered 2015-08-24: 50 mg via INTRAVENOUS

## 2015-08-24 MED ORDER — LACTATED RINGERS IV SOLN
INTRAVENOUS | Status: DC
  Administered 2015-08-24: 13:00:00 via INTRAVENOUS

## 2015-08-24 MED ORDER — MIDAZOLAM HCL 5 MG/5ML IJ SOLN
INTRAMUSCULAR | Status: DC | PRN
  Administered 2015-08-24: 2 mg via INTRAVENOUS

## 2015-08-24 MED ORDER — LIDOCAINE HCL (CARDIAC) 20 MG/ML IV SOLN
INTRAVENOUS | Status: DC | PRN
  Administered 2015-08-24: 80 mg via INTRAVENOUS

## 2015-08-24 MED ORDER — SODIUM CHLORIDE 0.9 % IV SOLN: INTRAVENOUS | Status: DC

## 2015-08-24 NOTE — Anesthesia Preprocedure Evaluation (Signed)
Anesthesia Evaluation  Patient identified by MRN, date of birth, ID band Patient awake    Reviewed: Allergy & Precautions, NPO status , Patient's Chart, lab work & pertinent test results  History of Anesthesia Complications Negative for: history of anesthetic complications  Airway Mallampati: II  TM Distance: >3 FB Neck ROM: Full    Dental  (+) Teeth Intact   Pulmonary neg shortness of breath, neg sleep apnea, neg COPD, neg recent URI, Current Smoker,    breath sounds clear to auscultation       Cardiovascular negative cardio ROS   Rhythm:Regular     Neuro/Psych  Headaches, neg Seizures PSYCHIATRIC DISORDERS Anxiety Depression    GI/Hepatic Neg liver ROS,   Endo/Other  negative endocrine ROS  Renal/GU negative Renal ROS     Musculoskeletal   Abdominal   Peds  Hematology  (+) anemia ,   Anesthesia Other Findings   Reproductive/Obstetrics                             Anesthesia Physical Anesthesia Plan  ASA: II  Anesthesia Plan: General   Post-op Pain Management:    Induction: Intravenous  Airway Management Planned: Oral ETT  Additional Equipment: None  Intra-op Plan:   Post-operative Plan: Extubation in OR  Informed Consent: I have reviewed the patients History and Physical, chart, labs and discussed the procedure including the risks, benefits and alternatives for the proposed anesthesia with the patient or authorized representative who has indicated his/her understanding and acceptance.   Dental advisory given  Plan Discussed with: CRNA and Surgeon  Anesthesia Plan Comments:         Anesthesia Quick Evaluation

## 2015-08-24 NOTE — Op Note (Signed)
[facility] [place], [id number]   ERCP PROCEDURE REPORT        EXAM DATE: 09-11-15  PATIENT NAME:          [name], [name]          MR #: [id number] BIRTHDATE:       1981-08-25     VISIT #:     [id number] ATTENDING:     Teena Irani, MD     STATUS:     inpatient ASSISTANT:      Berneta Levins and Corliss Parish   INDICATIONS:  The patient is a 34 yr old female here for an ERCP due to PROCEDURE PERFORMED:     ERCP with sphincterotomy MEDICATIONS:     M a C.  CONSENT: The patient understands the risks and benefits of the procedure and understands that these risks include, but are not limited to: sedation, allergic reaction, infection, perforation and/or bleeding. Alternative means of evaluation and treatment include, among others: physical exam, x-rays, and/or surgical intervention. The patient elects to proceed with this endoscopic procedure.  DESCRIPTION OF PROCEDURE: During intra-op preparation period all mechanical & medical equipment was checked for proper function. Hand hygiene and appropriate measures for infection prevention was taken. After the risks, benefits and alternatives of the procedure were thoroughly explained, Informed was verified, confirmed and timeout was successfully executed by the treatment team. With the patient in left semi-prone position, medications were administered intravenously.The Pentax Ercp Scope [id number] was passed from the mouth into the esophagus and further advanced from the esophagus into the stomach. From stomach scope was directed to the papilla of Vater.     .  Major papilla was aligned with the duodenoscope. The scope position was confirmed fluoroscopically. Rest of the findings/therapeutics are given below. The scope was then completely withdrawn from the patient and the procedure completed. The pulse, BP, and O2 saturation were monitored and documented by the physician and the nursing  staff throughout the entire procedure. The patient was cared for as planned according to standard protocol. The patient was then discharged to recovery in stable condition and with appropriate post procedure care. Estimated blood loss is zero unless otherwise noted in this procedure report.  duodenum: Normal positioning in size of papilla. Cannulated with standard sphincterotome. Cholangiogram obtained showing moderately dilated duct with at least one distal filling defect. The pancreatic duct was not entered. A generous sphincterotomy was performed and a 12-15 mm balloon catheter dragged several times through the papilla with the balloon up. No stone was seen endoscopically multiple occlusion cholangiograms after initial sweeps showed no further filling defects. There was good drainage seen to the procedure.    ADVERSE EVENT:     none immediate IMPRESSIONS:     common bile duct stone, removed after sphincterotomy  RECOMMENDATIONS:     observe for palpitation and check liver function tests in the morning. REPEAT EXAM:   ___________________________________ Teena Irani, MD eSigned:  Teena Irani, MD 09-11-15 1:52 PM   cc:  CPT CODES: ICD9 CODES:  The ICD and CPT codes recommended by this software are interpretations from the data that the clinical staff has captured with the software.  The verification of the translation of this report to the ICD and CPT codes and modifiers is the sole responsibility of the health care institution and practicing physician where this report was generated.  [employer] will not be held responsible for the validity of the ICD  and CPT codes included on this report.  AMA assumes no liability for data contained or not contained herein. CPT is a Designer, television/film set of the [employer].   PATIENT NAME:  [name] [name], [name] [name] MR#: [id number]

## 2015-08-24 NOTE — Anesthesia Procedure Notes (Signed)
Procedure Name: Intubation Date/Time: 08/24/2015 1:21 PM Performed by: Rejeana Brock L Pre-anesthesia Checklist: Patient identified, Emergency Drugs available, Suction available, Patient being monitored and Timeout performed Patient Re-evaluated:Patient Re-evaluated prior to inductionOxygen Delivery Method: Circle system utilized Preoxygenation: Pre-oxygenation with 100% oxygen Intubation Type: IV induction Ventilation: Mask ventilation without difficulty Laryngoscope Size: Mac and 3 Grade View: Grade I Tube type: Oral Tube size: 7.0 mm Number of attempts: 1 Airway Equipment and Method: Stylet Placement Confirmation: ETT inserted through vocal cords under direct vision,  positive ETCO2 and breath sounds checked- equal and bilateral Secured at: 21 cm Tube secured with: Tape Dental Injury: Teeth and Oropharynx as per pre-operative assessment

## 2015-08-24 NOTE — Transfer of Care (Signed)
Immediate Anesthesia Transfer of Care Note  Patient: [name] [name]  Procedure(s) Performed: Procedure(s): ENDOSCOPIC RETROGRADE CHOLANGIOPANCREATOGRAPHY (ERCP) (N/A)  Patient Location: PACU  Anesthesia Type:General  Level of Consciousness: awake  Airway & Oxygen Therapy: Patient Spontanous Breathing and Patient connected to nasal cannula oxygen  Post-op Assessment: Report given to RN, Post -op Vital signs reviewed and stable and Patient moving all extremities X 4  Post vital signs: stable  Last Vitals:  Filed Vitals:   08/24/15 1122 08/24/15 1402  BP: 130/70 127/70  Pulse: 81 93  Temp: 36.8 C 36.7 C  Resp: 16 17    Complications: No apparent anesthesia complications

## 2015-08-24 NOTE — Progress Notes (Signed)
[facility] PROGRESS NOTE  [name] [id number] DOB: August 05, 1981 DOA: 08/22/2015 PCP: Nance Pear., NP Brief History: [name] is a 34 year old with a past medical history of depression, anxiety, cholecystectomy 6 weeks ago, presents with epigastric pain radiating to the back.   Assessment/Plan:  Abdominal pain: possibly from CBD dilatation from CBD stone: Admitted, IV hydration, IV pain control.  GI consulted and underwent ERCP,  With spincterotomy. CBD stone was removed , she was started on liquid diet and advance diet as tolerated.  Repeat liver function panel in am.  Empirically on IV antibiotics ciprofloxacin and flagyl.     Code Status: full code Family Communication: none at bedside. Disposition Plan: possible home in 1 to 2 days when her liver function panel improves and her abd pain improves.    Consultants:  GI  Procedures: MRCP  Antibiotics:  ciprofloxacin  flagyl  HPI/Subjective: Pain well controlled.  Objective: Filed Vitals:   08/24/15 1430 08/24/15 1440  BP: 130/67 125/68  Pulse: 82 83  Temp:    Resp: 17 16    Intake/Output Summary (Last 24 hours) at 08/24/15 1542 Last data filed at 08/24/15 1345  Gross per 24 hour  Intake 3333.33 ml  Output    850 ml  Net 2483.33 ml   Filed Weights   08/21/15 1953 08/22/15 0524  Weight: 80.74 kg (178 lb) 79.833 kg (176 lb)    Exam:   General:  Alert afebrile   Cardiovascular: s1s2  Respiratory: ctab  Abdomen:soft non distended, diffuse, gen tenderness, bs+  Musculoskeletal: no pedal edema  Data Reviewed: Basic Metabolic Panel:  Recent Labs Lab 08/21/15 1957 08/23/15 0700 08/24/15 0600  NA 139 137 141  K 3.6 3.8 3.3*  CL 106 102 106  CO2 23 25 27   GLUCOSE 159* 90 81  BUN <5* <5* <5*  CREATININE 0.71 0.63 0.56  CALCIUM 8.8* 8.3* 8.4*   Liver Function Tests:  Recent Labs Lab 08/21/15 1957 08/22/15 1050 08/23/15 0700 08/24/15 0600  AST 87* 112* 187*  201*  ALT 99* 134* 204* 217*  ALKPHOS 134* 165* 200* 177*  BILITOT 2.4* 4.1* 5.7* 5.4*  PROT 7.1 6.9 6.4* 5.6*  ALBUMIN 3.6 3.5 3.0* 2.7*    Recent Labs Lab 08/21/15 1957  LIPASE 22   No results for input(s): AMMONIA in the last 168 hours. CBC:  Recent Labs Lab 08/21/15 1957 08/23/15 0700 08/24/15 0600  WBC 6.3 8.6 4.4  HGB 11.3* 10.7* 9.5*  HCT 36.4 33.5* 30.0*  MCV 77.3* 77.4* 77.3*  PLT 255 185 151   Cardiac Enzymes: No results for input(s): CKTOTAL, CKMB, CKMBINDEX, TROPONINI in the last 168 hours. BNP (last 3 results) No results for input(s): BNP in the last 8760 hours.  ProBNP (last 3 results) No results for input(s): PROBNP in the last 8760 hours.  CBG: No results for input(s): GLUCAP in the last 168 hours.  Recent Results (from the past 240 hour(s))  Culture, blood (Routine X 2) w Reflex to ID Panel     Status: None (Preliminary result)   Collection Time: 08/23/15  7:00 AM  Result Value Ref Range Status   Specimen Description BLOOD RIGHT ANTECUBITAL  Final   Special Requests BOTTLES DRAWN AEROBIC AND ANAEROBIC 5CCS  Final   Culture NO GROWTH 1 DAY  Final   Report Status PENDING  Incomplete  Culture, blood (Routine X 2) w Reflex to ID Panel     Status: None (Preliminary result)   Collection Time: 08/23/15  7:10  AM  Result Value Ref Range Status   Specimen Description BLOOD RIGHT ANTECUBITAL  Final   Special Requests BOTTLES DRAWN AEROBIC AND ANAEROBIC 10CCS  Final   Culture NO GROWTH 1 DAY  Final   Report Status PENDING  Incomplete     Studies: Mr 3d Recon At Scanner  08/26/15  CLINICAL DATA:  Epigastric pain with nausea and vomiting. Right seen LFTs. Cholecystectomy 6 weeks ago. EXAM: MRI ABDOMEN WITHOUT AND WITH CONTRAST (INCLUDING MRCP) TECHNIQUE: Multiplanar multisequence MR imaging of the abdomen was performed both before and after the administration of intravenous contrast. Heavily T2-weighted images of the biliary and pancreatic ducts were  obtained, and three-dimensional MRCP images were rendered by post processing. CONTRAST:  22mL MULTIHANCE GADOBENATE DIMEGLUMINE 529 MG/ML IV SOLN COMPARISON:  Ultrasound 08/22/2015 and 07/07/2015. FINDINGS: Lower chest:  Unremarkable. Hepatobiliary: Status post cholecystectomy. Interval development of mild intra and extrahepatic biliary dilatation. The common hepatic duct measures up to 12 mm in diameter. The duct tapers distally. No intraductal calculi demonstrated. There is probable mild periportal edema. The liver otherwise appears normal without focal lesion or abnormal enhancement. Pancreas: The pancreas appears normal. There is no evidence of pancreatic mass, pancreatic ductal dilatation or surrounding inflammatory change. The pancreas enhances homogeneously. Spleen: Normal in size without focal abnormality. Adrenals/Urinary Tract: Both adrenal glands appear normal. The kidneys appear normal without evidence of urinary tract calculus or hydronephrosis. Bladder not imaged. Stomach/Bowel: No evidence of bowel wall thickening, distention or surrounding inflammatory change. Vascular/Lymphatic: There are no enlarged abdominal lymph nodes. No significant vascular findings are present. The portal, superior mesenteric and splenic veins appear normal. Other: None. Musculoskeletal: No acute or significant osseous findings. IMPRESSION: 1. Interval development of intra and extrahepatic biliary dilatation. No evidence of choledocholithiasis. Findings suggest recent passage of a common duct stone. Continued surveillance of liver function studies recommended. Findings were discussed with Dr. Oletta Lamas. 2. The cholecystectomy bed and liver otherwise appear unremarkable. 3. No evidence of pancreatitis. Electronically Signed   By: Richardean Sale M.D.   On: 26-Aug-2015 08:34   Dg Ercp Biliary & Pancreatic Ducts  08/24/2015  CLINICAL DATA:  34 year old female with a history of choledocholithiasis EXAM: ERCP TECHNIQUE: Multiple  spot images obtained with the fluoroscopic device and submitted for interpretation post-procedure. FLUOROSCOPY TIME:  Fluoroscopy Time:  1 minutes 3 seconds COMPARISON:  MR 08/22/2015 FINDINGS: Initial image demonstrates endoscope projecting over the upper abdomen with cannulation of the ampulla. Initial image demonstrates partial opacification of extrahepatic biliary ducts. Surgical changes of cholecystectomy. Transient filling defects of the distal common bile duct, potentially debris or air bubbles. IMPRESSION: Limited images during ERCP demonstrates transient filling defects of the distal common bile duct, potentially air bubbles or debris/ stones. Please refer to the dictated operative report for full details of intraoperative findings and procedure. Changes of prior cholecystectomy. Signed, Dulcy Fanny. Earleen Newport, DO [facility] Electronically Signed   By: Corrie Mckusick D.O.   On: 08/24/2015 15:19   Mr Jeananne Rama W/wo Cm/mrcp  August 26, 2015  CLINICAL DATA:  Epigastric pain with nausea and vomiting. Right seen LFTs. Cholecystectomy 6 weeks ago. EXAM: MRI ABDOMEN WITHOUT AND WITH CONTRAST (INCLUDING MRCP) TECHNIQUE: Multiplanar multisequence MR imaging of the abdomen was performed both before and after the administration of intravenous contrast. Heavily T2-weighted images of the biliary and pancreatic ducts were obtained, and three-dimensional MRCP images were rendered by post processing. CONTRAST:  53mL MULTIHANCE GADOBENATE DIMEGLUMINE 529 MG/ML IV SOLN COMPARISON:  Ultrasound 08/22/2015 and  07/07/2015. FINDINGS: Lower chest:  Unremarkable. Hepatobiliary: Status post cholecystectomy. Interval development of mild intra and extrahepatic biliary dilatation. The common hepatic duct measures up to 12 mm in diameter. The duct tapers distally. No intraductal calculi demonstrated. There is probable mild periportal edema. The liver otherwise appears normal without focal  lesion or abnormal enhancement. Pancreas: The pancreas appears normal. There is no evidence of pancreatic mass, pancreatic ductal dilatation or surrounding inflammatory change. The pancreas enhances homogeneously. Spleen: Normal in size without focal abnormality. Adrenals/Urinary Tract: Both adrenal glands appear normal. The kidneys appear normal without evidence of urinary tract calculus or hydronephrosis. Bladder not imaged. Stomach/Bowel: No evidence of bowel wall thickening, distention or surrounding inflammatory change. Vascular/Lymphatic: There are no enlarged abdominal lymph nodes. No significant vascular findings are present. The portal, superior mesenteric and splenic veins appear normal. Other: None. Musculoskeletal: No acute or significant osseous findings. IMPRESSION: 1. Interval development of intra and extrahepatic biliary dilatation. No evidence of choledocholithiasis. Findings suggest recent passage of a common duct stone. Continued surveillance of liver function studies recommended. Findings were discussed with Dr. Oletta Lamas. 2. The cholecystectomy bed and liver otherwise appear unremarkable. 3. No evidence of pancreatitis. Electronically Signed   By: Richardean Sale M.D.   On: 08/23/2015 08:34    Scheduled Meds: . ciprofloxacin  400 mg Intravenous Q12H  . metronidazole  500 mg Intravenous 3 times per day  . potassium chloride  40 mEq Oral BID   Continuous Infusions: . sodium chloride Stopped (08/23/15 1217)    Principal Problem:   Upper abdominal pain Active Problems:   ANXIETY DEPRESSION   Microcytic anemia   Transaminitis   Abdominal pain    Time spent: 35 min    [place]  [facility] Pager [phone]. If 7PM-7AM, please contact night-coverage at [email or web address], password [facility] 08/24/2015, 3:42 PM  LOS: 2 days

## 2015-08-25 ENCOUNTER — Encounter (HOSPITAL_COMMUNITY): Payer: Self-pay | Admitting: Gastroenterology

## 2015-08-25 DIAGNOSIS — K8051 Calculus of bile duct without cholangitis or cholecystitis with obstruction: Secondary | ICD-10-CM

## 2015-08-25 DIAGNOSIS — K8050 Calculus of bile duct without cholangitis or cholecystitis without obstruction: Secondary | ICD-10-CM | POA: Insufficient documentation

## 2015-08-25 DIAGNOSIS — R109 Unspecified abdominal pain: Secondary | ICD-10-CM

## 2015-08-25 DIAGNOSIS — F909 Attention-deficit hyperactivity disorder, unspecified type: Secondary | ICD-10-CM

## 2015-08-25 LAB — COMPREHENSIVE METABOLIC PANEL
ALBUMIN: 2.7 g/dL — AB (ref 3.5–5.0)
ALK PHOS: 176 U/L — AB (ref 38–126)
ALT: 204 U/L — ABNORMAL HIGH (ref 14–54)
ANION GAP: 8 (ref 5–15)
AST: 140 U/L — ABNORMAL HIGH (ref 15–41)
BUN: <5 mg/dL — ABNORMAL LOW (ref 6–20)
CALCIUM: 8.4 mg/dL — AB (ref 8.9–10.3)
CHLORIDE: 106 mmol/L (ref 101–111)
CO2: 26 mmol/L (ref 22–32)
Creatinine, Ser: 0.48 mg/dL (ref 0.44–1.00)
GFR calc non Af Amer: >60 mL/min (ref >60)
GLUCOSE: 74 mg/dL (ref 65–99)
Potassium: 3.7 mmol/L (ref 3.5–5.1)
SODIUM: 140 mmol/L (ref 135–145)
Total Bilirubin: 5.2 mg/dL — ABNORMAL HIGH (ref 0.3–1.2)
Total Protein: 5.9 g/dL — ABNORMAL LOW (ref 6.5–8.1)

## 2015-08-25 MED ORDER — OXYCODONE HCL 5 MG PO TABS
10.0000 mg | ORAL_TABLET | ORAL | Status: DC | PRN
  Administered 2015-08-25 – 2015-08-26 (×3): 10 mg via ORAL
  Filled 2015-08-25 (×3): qty 2

## 2015-08-25 MED ORDER — AMPHETAMINE-DEXTROAMPHETAMINE 10 MG PO TABS
20.0000 mg | ORAL_TABLET | Freq: Every day | ORAL | Status: DC
  Administered 2015-08-25 – 2015-08-26 (×2): 20 mg via ORAL
  Filled 2015-08-25 (×2): qty 2

## 2015-08-25 MED ORDER — DIPHENHYDRAMINE HCL 25 MG PO CAPS
25.0000 mg | ORAL_CAPSULE | Freq: Three times a day (TID) | ORAL | Status: DC | PRN
  Administered 2015-08-25: 25 mg via ORAL
  Filled 2015-08-25: qty 1

## 2015-08-25 MED ORDER — ALPRAZOLAM 0.5 MG PO TABS
0.5000 mg | ORAL_TABLET | Freq: Three times a day (TID) | ORAL | Status: DC | PRN
  Administered 2015-08-25: 0.5 mg via ORAL
  Filled 2015-08-25: qty 1

## 2015-08-25 NOTE — Progress Notes (Signed)
Subjective: Abdominal epigastric pain slowing improving. She is still itching. Tolerating diet ok.  Objective: Vital signs in last 24 hours: Temp:  [97.9 F (36.6 C)-98.7 F (37.1 C)] 98.7 F (37.1 C) (12/28 0849) Pulse Rate:  [69-93] 69 (12/28 0849) Resp:  [15-18] 16 (12/28 0849) BP: (119-131)/(65-72) 119/66 mmHg (12/28 0849) SpO2:  [95 %-99 %] 98 % (12/28 0849) Weight change:  Last BM Date: 08/22/15  PE: GEN:  Overweight, NAD ABD:  Very mild epigastric tenderness, active bowel sounds  Lab Results: CBC    Component Value Date/Time   WBC 4.4 08/24/2015 0600   RBC 3.88 08/24/2015 0600   HGB 9.5* 08/24/2015 0600   HCT 30.0* 08/24/2015 0600   PLT 151 08/24/2015 0600   MCV 77.3* 08/24/2015 0600   MCH 24.5* 08/24/2015 0600   MCHC 31.7 08/24/2015 0600   RDW 18.8* 08/24/2015 0600   LYMPHSABS 2.8 07/07/2015 0016   MONOABS 0.3 07/07/2015 0016   EOSABS 0.2 07/07/2015 0016   BASOSABS 0.0 07/07/2015 0016   CMP     Component Value Date/Time   NA 140 08/25/2015 0439   K 3.7 08/25/2015 0439   CL 106 08/25/2015 0439   CO2 26 08/25/2015 0439   GLUCOSE 74 08/25/2015 0439   BUN <5* 08/25/2015 0439   CREATININE 0.48 08/25/2015 0439   CALCIUM 8.4* 08/25/2015 0439   PROT 5.9* 08/25/2015 0439   ALBUMIN 2.7* 08/25/2015 0439   AST 140* 08/25/2015 0439   ALT 204* 08/25/2015 0439   ALKPHOS 176* 08/25/2015 0439   BILITOT 5.2* 08/25/2015 0439   GFRNONAA >60 08/25/2015 0439   GFRAA >60 08/25/2015 0439   Assessment:  1.  Bile duct stone, not seen on MRCP, with biliary sphincterotomy and stone extraction by Dr. Amedeo Plenty yesterday. 2.  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 3.  Post-cholecystectomy. 4.  Abdominal pain, improving, likely from #1 and papillary stenosis, as above.  Plan:  1.  Antihistamines (e.g., Atarax or diphenhydramine) or cholestyramine as-needed for pruritus. 2.  Advance diet. 3.  Follow  LFTs. 4.  Minimize use of narcotics, which might also be contributing to pruritus and can slow biliary tract motility (and cause LFTS to remain elevated for a longer period of time). 5.  If patient does ok with advance in diet today, patient can likely be discharged home today to follow-up with Dr. Amedeo Plenty (who did her ERCP yesterday) as outpatient. 6.  [facility] will follow.   Landry Dyke 08/25/2015, 11:22 AM   Pager [phone] If no answer or after 5 PM call [phone]

## 2015-08-25 NOTE — Anesthesia Postprocedure Evaluation (Signed)
Anesthesia Post Note  Patient: [name] [name]  Procedure(s) Performed: Procedure(s) (LRB): ENDOSCOPIC RETROGRADE CHOLANGIOPANCREATOGRAPHY (ERCP) (N/A)  Patient location during evaluation: Endoscopy Anesthesia Type: MAC Level of consciousness: awake Pain management: pain level controlled Vital Signs Assessment: post-procedure vital signs reviewed and stable Respiratory status: spontaneous breathing Cardiovascular status: stable Postop Assessment: no signs of nausea or vomiting Anesthetic complications: no    Last Vitals:  Filed Vitals:   08/24/15 2037 08/25/15 0849  BP: 131/72 119/66  Pulse: 76 69  Temp:  37.1 C  Resp: 16 16    Last Pain:  Filed Vitals:   08/25/15 0939  PainSc: 5                  Reverie Vaquera

## 2015-08-25 NOTE — Progress Notes (Addendum)
[facility] PROGRESS NOTE  [name] [name] [id number] DOB: 03-04-1981 DOA: 08/22/2015 PCP: Nance Pear., NP  Brief Summary  [name] is a 34 year old with a past medical history of depression, anxiety, cholecystectomy 6 weeks ago (11/ 9); who presented with a 3-day history epigastric abdominal pain radiating to the back.  She reported similar symptoms to prior to getting her gallbladder removed. She was seen at [facility] and was evaluated by surgery and gastroenterology. Patient underwent a HIDA scan and CT scan which showed no obvious biliary obstruction.  Her labs showed a lipase 70, AST124, ALT 106, total bili 2.4. Patient was given the option to have a MRCP but had felt better and decided come back to [place].  Initial lab work done at admission demonstrated T bili 2.4, AST 87, ALT 99, lipase 22. Abdominal ultrasound showed no acute abnormalities.  She was admitted for further work up.    Assessment/Plan  CBD stone.  She was given IVF, pain medication and [facility] was consulted.  She underwent ERCP with sphincterotomy on 12/27 by Dr. Teena Irani.  On his initial cholangiogram, a stone was apparent in the CBD.  He attempted to sweep the stone, but did not visualize its removal.  Despite not seeing the stone removed, repeat cholangiogram at the end of the procedure demonstrated no further filling defect suggesting that the stone was cleared.  Although she was initially started on empiric ciprofloxacin and flagyl, these antibiotics were discontinued after no pus was extracted during ERCP, temperature and WBC were normal.   -  Per GI, plan to discharge home tomorrow if LFTs continue to decrease -  D/c IV pain medication and IV benzo -  Continue oral pain medication  -  Change to home dose xanax  -  Change to oral benadryl  ADHD, okay to resume adderall ONCE daily.  Suspect that some of her anxiety may be from her adderall being dose TID.    Diet:  Low  fat/soft Access:  PIV IVF:  off Proph:  SCD, frequent ambulation  Code Status: full Family Communication: patient and her husband Disposition Plan: home tomorrow pending labs   Consultants:  Gastroenterology, [facility]  Procedures:  ERCP with stone extraction on 12/27  Antibiotics:  Cipro/flagyl, stopped on 12/28   HPI/Subjective:  Continues to have itching and eye yellowness with abdominal pain which is improving.  No gas or BM since procedure  Objective: Filed Vitals:   08/24/15 1440 08/24/15 1709 08/24/15 2037 08/25/15 0849  BP: 125/68 119/65 131/72 119/66  Pulse: 83 85 76 69  Temp:  98.6 F (37 C) 97.9 F (36.6 C) 98.7 F (37.1 C)  TempSrc:  Oral Oral Oral  Resp: 16 18 16 16   Height:      Weight:      SpO2: 95% 98% 98% 98%    Intake/Output Summary (Last 24 hours) at 08/25/15 1542 Last data filed at 08/25/15 1414  Gross per 24 hour  Intake 3161.67 ml  Output   1900 ml  Net 1261.67 ml   Filed Weights   08/21/15 1953 08/22/15 0524  Weight: 80.74 kg (178 lb) 79.833 kg (176 lb)   Body mass index is 28.42 kg/(m^2).  Exam:   General:  Adult female, No acute distress  HEENT:  NCAT, MMM  Cardiovascular:  RRR, nl S1, S2 no mrg, 2+ pulses, warm extremities  Respiratory:  CTAB, no increased WOB  Abdomen:   NABS, soft, NT/ND  MSK:   Normal  tone and bulk, no LEE  Neuro:  Grossly intact  Data Reviewed: Basic Metabolic Panel:  Recent Labs Lab 08/21/15 1957 08/23/15 0700 08/24/15 0600 08/25/15 0439  NA 139 137 141 140  K 3.6 3.8 3.3* 3.7  CL 106 102 106 106  CO2 23 25 27 26   GLUCOSE 159* 90 81 74  BUN <5* <5* <5* <5*  CREATININE 0.71 0.63 0.56 0.48  CALCIUM 8.8* 8.3* 8.4* 8.4*   Liver Function Tests:  Recent Labs Lab 08/21/15 1957 08/22/15 1050 08/23/15 0700 08/24/15 0600 08/25/15 0439  AST 87* 112* 187* 201* 140*  ALT 99* 134* 204* 217* 204*  ALKPHOS 134* 165* 200* 177* 176*  BILITOT 2.4* 4.1* 5.7* 5.4* 5.2*  PROT 7.1 6.9 6.4*  5.6* 5.9*  ALBUMIN 3.6 3.5 3.0* 2.7* 2.7*    Recent Labs Lab 08/21/15 1957  LIPASE 22   No results for input(s): AMMONIA in the last 168 hours. CBC:  Recent Labs Lab 08/21/15 1957 08/23/15 0700 08/24/15 0600  WBC 6.3 8.6 4.4  HGB 11.3* 10.7* 9.5*  HCT 36.4 33.5* 30.0*  MCV 77.3* 77.4* 77.3*  PLT 255 185 151    Recent Results (from the past 240 hour(s))  Culture, blood (Routine X 2) w Reflex to ID Panel     Status: None (Preliminary result)   Collection Time: 08/23/15  7:00 AM  Result Value Ref Range Status   Specimen Description BLOOD RIGHT ANTECUBITAL  Final   Special Requests BOTTLES DRAWN AEROBIC AND ANAEROBIC 5CCS  Final   Culture NO GROWTH 2 DAYS  Final   Report Status PENDING  Incomplete  Culture, blood (Routine X 2) w Reflex to ID Panel     Status: None (Preliminary result)   Collection Time: 08/23/15  7:10 AM  Result Value Ref Range Status   Specimen Description BLOOD RIGHT ANTECUBITAL  Final   Special Requests BOTTLES DRAWN AEROBIC AND ANAEROBIC 10CCS  Final   Culture NO GROWTH 2 DAYS  Final   Report Status PENDING  Incomplete     Studies: Dg Ercp Biliary & Pancreatic Ducts  08/24/2015  CLINICAL DATA:  34 year old female with a history of choledocholithiasis EXAM: ERCP TECHNIQUE: Multiple spot images obtained with the fluoroscopic device and submitted for interpretation post-procedure. FLUOROSCOPY TIME:  Fluoroscopy Time:  1 minutes 3 seconds COMPARISON:  MR 08/22/2015 FINDINGS: Initial image demonstrates endoscope projecting over the upper abdomen with cannulation of the ampulla. Initial image demonstrates partial opacification of extrahepatic biliary ducts. Surgical changes of cholecystectomy. Transient filling defects of the distal common bile duct, potentially debris or air bubbles. IMPRESSION: Limited images during ERCP demonstrates transient filling defects of the distal common bile duct, potentially air bubbles or debris/ stones. Please refer to the  dictated operative report for full details of intraoperative findings and procedure. Changes of prior cholecystectomy. Signed, Dulcy Fanny. Earleen Newport, DO [facility] Electronically Signed   By: Corrie Mckusick D.O.   On: 08/24/2015 15:19    Scheduled Meds: . amphetamine-dextroamphetamine  20 mg Oral Q breakfast   Continuous Infusions:   Principal Problem:   Common bile duct stone Active Problems:   ANXIETY DEPRESSION   Attention deficit disorder   Upper abdominal pain   Microcytic anemia   Transaminitis   Abdominal pain    Time spent: 30 min    Elsi Stelzer, [facility] Pager [phone]. If 7PM-7AM, please contact night-coverage at [email or web address], password [facility] 08/25/2015, 3:42 PM  LOS: 3 days

## 2015-08-26 DIAGNOSIS — D509 Iron deficiency anemia, unspecified: Secondary | ICD-10-CM

## 2015-08-26 DIAGNOSIS — K8050 Calculus of bile duct without cholangitis or cholecystitis without obstruction: Principal | ICD-10-CM

## 2015-08-26 LAB — COMPREHENSIVE METABOLIC PANEL
ALK PHOS: 209 U/L — AB (ref 38–126)
ALT: 203 U/L — AB (ref 14–54)
AST: 144 U/L — ABNORMAL HIGH (ref 15–41)
Albumin: 2.9 g/dL — ABNORMAL LOW (ref 3.5–5.0)
Anion gap: 9 (ref 5–15)
BUN: 5 mg/dL — ABNORMAL LOW (ref 6–20)
CALCIUM: 8.7 mg/dL — AB (ref 8.9–10.3)
CHLORIDE: 104 mmol/L (ref 101–111)
CO2: 24 mmol/L (ref 22–32)
CREATININE: 0.6 mg/dL (ref 0.44–1.00)
Glucose, Bld: 86 mg/dL (ref 65–99)
Potassium: 4 mmol/L (ref 3.5–5.1)
Sodium: 137 mmol/L (ref 135–145)
Total Bilirubin: 4.8 mg/dL — ABNORMAL HIGH (ref 0.3–1.2)
Total Protein: 6.6 g/dL (ref 6.5–8.1)

## 2015-08-26 MED ORDER — FERROUS SULFATE 325 (65 FE) MG PO TABS: 325.0000 mg | ORAL_TABLET | Freq: Two times a day (BID) | ORAL | Status: DC

## 2015-08-26 MED ORDER — TRAMADOL HCL 50 MG PO TABS: 50.0000 mg | ORAL_TABLET | Freq: Four times a day (QID) | ORAL | Status: DC | PRN

## 2015-08-26 NOTE — Progress Notes (Signed)
Subjective: Abdominal pain continues to gradually improve. Tolerating advance in diet.  Objective: Vital signs in last 24 hours: Temp:  [98 F (36.7 C)-98.4 F (36.9 C)] 98.1 F (36.7 C) (12/29 0932) Pulse Rate:  [57-82] 82 (12/29 0932) Resp:  [16-18] 18 (12/29 0932) BP: (128-136)/(71-82) 134/73 mmHg (12/29 0932) SpO2:  [99 %-100 %] 99 % (12/29 0932) Weight:  [80.287 kg (177 lb)] 80.287 kg (177 lb) (12/28 2022) Weight change:  Last BM Date: 08/21/15  PE: GEN:  NAD ABD:  Soft, mild epigastric tenderness  Lab Results: CBC    Component Value Date/Time   WBC 4.4 08/24/2015 0600   RBC 3.88 08/24/2015 0600   HGB 9.5* 08/24/2015 0600   HCT 30.0* 08/24/2015 0600   PLT 151 08/24/2015 0600   MCV 77.3* 08/24/2015 0600   MCH 24.5* 08/24/2015 0600   MCHC 31.7 08/24/2015 0600   RDW 18.8* 08/24/2015 0600   LYMPHSABS 2.8 07/07/2015 0016   MONOABS 0.3 07/07/2015 0016   EOSABS 0.2 07/07/2015 0016   BASOSABS 0.0 07/07/2015 0016   CMP     Component Value Date/Time   NA 137 08/26/2015 0822   K 4.0 08/26/2015 0822   CL 104 08/26/2015 0822   CO2 24 08/26/2015 0822   GLUCOSE 86 08/26/2015 0822   BUN 5* 08/26/2015 0822   CREATININE 0.60 08/26/2015 0822   CALCIUM 8.7* 08/26/2015 0822   PROT 6.6 08/26/2015 0822   ALBUMIN 2.9* 08/26/2015 0822   AST 144* 08/26/2015 0822   ALT 203* 08/26/2015 0822   ALKPHOS 209* 08/26/2015 0822   BILITOT 4.8* 08/26/2015 0822   GFRNONAA >60 08/26/2015 0822   GFRAA >60 08/26/2015 [id number]   Assessment:  1. Bile duct stone, not seen on MRCP, with biliary sphincterotomy and stone extraction by Dr. Amedeo Plenty yesterday. 2. 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 3. Post-cholecystectomy. 4. Abdominal pain, improving, likely from #1 and papillary stenosis, as above.  Plan:  1.  Soft bland, low fat diet over the next few days, gradually advance as tolerated. 2.  OK to discharge home  today from GI perspective. 3.  Minimize use of narcotics; tramadol might be reasonable choice for as-needed analgesic. 4.  Will arrange outpatient follow-up with Dr. Amedeo Plenty. 5.  Will sign-off; please call with questions; thank you for the consult.   [name] [name] 08/26/2015, 9:39 AM   Pager [phone] If no answer or after 5 PM call [phone]

## 2015-08-26 NOTE — Discharge Summary (Signed)
Physician Discharge Summary  [name] [name] [id number] DOB: 1981/07/17 DOA: 08/22/2015  PCP: Nance Pear., NP  Admit date: 08/22/2015 Discharge date: 08/26/2015  Recommendations for Outpatient Follow-up:  1. Follow-up with Dr. Amedeo Plenty, [facility] gastroenterology, within 2-4 weeks. Please repeat CMP. 2. Advised continuation of soft, bland, low-fat diet and then gradually advance as tolerated 3. Follow-up with primary care doctor or psychiatrist. Anxiety issues may be related to frequent dosing of Adderall.   4. Repeat iron studies and hemoglobin in 2-4 weeks  Discharge Diagnoses:  Principal Problem:   Common bile duct stone Active Problems:   ANXIETY DEPRESSION   Attention deficit disorder   Upper abdominal pain   Microcytic anemia   Transaminitis   Abdominal pain   Bile duct calculus   Discharge Condition: stable, improved  Diet recommendation: low-fat, advance as tolerated  Wt Readings from Last 3 Encounters:  08/25/15 80.287 kg (177 lb)  07/07/15 92.806 kg (204 lb 9.6 oz)  05/04/15 102.513 kg (226 lb)    History of present illness:  [name] [name] is a 34 year old with a past medical history of depression, anxiety, cholecystectomy 6 weeks ago (11/ 9); who presented with a 3-day history epigastric abdominal pain radiating to the back. She reported similar symptoms to prior to getting her gallbladder removed. She was seen at [facility] and was evaluated by surgery and gastroenterology. Patient underwent a HIDA scan and CT scan which showed no obvious biliary obstruction. Her labs showed a lipase 70, AST124, ALT 106, total bili 2.4. Patient was given the option to have a MRCP but had felt better and decided come back to [facility]. Initial lab work done at admission demonstrated T bili 2.4, AST 87, ALT 99, lipase 22. Abdominal ultrasound showed no acute abnormalities. She was admitted for further work up.   Hospital Course:   CBD stone. She was  given IVF, pain medication and [facility] was consulted. She underwent ERCP with sphincterotomy on 12/27 by Dr. Teena Irani. On his initial cholangiogram, a stone was apparent in the CBD. He attempted to sweep the stone, but did not visualize its removal. Despite not seeing the stone removed, repeat cholangiogram at the end of the procedure demonstrated no further filling defect suggesting that the stone was cleared. Although she was initially started on empiric ciprofloxacin and flagyl, these antibiotics were discontinued after no pus was extracted during ERCP, temperature and WBC were normal. Her liver function enzymes remained elevated but were gradually trending down at the time of discharge.  She was able to tolerate a bland, soft, low-fat diet with oral pain medication prior to discharge. She will follow-up with gastroenterology in a few weeks for reevaluation and CMP.  ADHD, okay to resume adderall ONCE daily. Suspect that some of her anxiety may be from her adderall being dose TID.   Microcytic anemia suggestive of iron deficiency postpartum.  Defer work up to PCP.  Recommended once daily iron supplementation pending follow up.    Consultants:  Gastroenterology, [facility]  Procedures:  ERCP with stone extraction on 12/27  Antibiotics:  Cipro/flagyl, stopped on 12/28  Discharge Exam: Filed Vitals:   08/26/15 0455 08/26/15 0932  BP: 136/71 134/73  Pulse: 57 82  Temp: 98.2 F (36.8 C) 98.1 F (36.7 C)  Resp: 18 18   Filed Vitals:   08/25/15 1654 08/25/15 2022 08/26/15 0455 08/26/15 0932  BP: 136/82 128/76 136/71 134/73  Pulse: 70 67 57 82  Temp: 98 F (36.7 C) 98.4 F (36.9  C) 98.2 F (36.8 C) 98.1 F (36.7 C)  TempSrc: Oral Oral Oral Oral  Resp: 18 16 18 18   Height:      Weight:  80.287 kg (177 lb)    SpO2: 99% 100% 100% 99%     General: Adult female, No acute distress  HEENT: NCAT, MMM  Cardiovascular: RRR, nl S1, S2 no mrg, 2+ pulses, warm  extremities  Respiratory: CTAB, no increased WOB  Abdomen: NABS, soft, NT/ND  MSK: Normal tone and bulk, no LEE  Neuro: Grossly intact  Discharge Instructions     Medication List    ASK your doctor about these medications        ALPRAZolam 0.5 MG tablet  Commonly known as:  XANAX  Take 0.5 mg by mouth 4 (four) times daily as needed for anxiety.     amphetamine-dextroamphetamine 20 MG tablet  Commonly known as:  ADDERALL  Take 20 mg by mouth 3 (three) times daily.     MICROGESTIN FE 1/20 1-20 MG-MCG tablet  Generic drug:  norethindrone-ethinyl estradiol  Take 1 tablet by mouth daily.          The results of significant diagnostics from this hospitalization (including imaging, microbiology, ancillary and laboratory) are listed below for reference.    Significant Diagnostic Studies: Mr 3d Recon At Scanner  2015-08-31  CLINICAL DATA:  Epigastric pain with nausea and vomiting. Right seen LFTs. Cholecystectomy 6 weeks ago. EXAM: MRI ABDOMEN WITHOUT AND WITH CONTRAST (INCLUDING MRCP) TECHNIQUE: Multiplanar multisequence MR imaging of the abdomen was performed both before and after the administration of intravenous contrast. Heavily T2-weighted images of the biliary and pancreatic ducts were obtained, and three-dimensional MRCP images were rendered by post processing. CONTRAST:  44mL MULTIHANCE GADOBENATE DIMEGLUMINE 529 MG/ML IV SOLN COMPARISON:  Ultrasound 08/22/2015 and 07/07/2015. FINDINGS: Lower chest:  Unremarkable. Hepatobiliary: Status post cholecystectomy. Interval development of mild intra and extrahepatic biliary dilatation. The common hepatic duct measures up to 12 mm in diameter. The duct tapers distally. No intraductal calculi demonstrated. There is probable mild periportal edema. The liver otherwise appears normal without focal lesion or abnormal enhancement. Pancreas: The pancreas appears normal. There is no evidence of pancreatic mass, pancreatic ductal  dilatation or surrounding inflammatory change. The pancreas enhances homogeneously. Spleen: Normal in size without focal abnormality. Adrenals/Urinary Tract: Both adrenal glands appear normal. The kidneys appear normal without evidence of urinary tract calculus or hydronephrosis. Bladder not imaged. Stomach/Bowel: No evidence of bowel wall thickening, distention or surrounding inflammatory change. Vascular/Lymphatic: There are no enlarged abdominal lymph nodes. No significant vascular findings are present. The portal, superior mesenteric and splenic veins appear normal. Other: None. Musculoskeletal: No acute or significant osseous findings. IMPRESSION: 1. Interval development of intra and extrahepatic biliary dilatation. No evidence of choledocholithiasis. Findings suggest recent passage of a common duct stone. Continued surveillance of liver function studies recommended. Findings were discussed with Dr. Oletta Lamas. 2. The cholecystectomy bed and liver otherwise appear unremarkable. 3. No evidence of pancreatitis. Electronically Signed   By: Richardean Sale M.D.   On: 08/31/15 08:34   US Abdomen Limited  08/22/2015  CLINICAL DATA:  34 year old female with upper abdominal pain, nausea and vomiting. Cholecystectomy. EXAM: US ABDOMEN LIMITED - RIGHT UPPER QUADRANT COMPARISON:  Ultrasound dated 07/07/2015 FINDINGS: Gallbladder: Cholecystectomy. Common bile duct: Diameter: 5 mm Liver: No focal lesion identified. Within normal limits in parenchymal echogenicity. IMPRESSION: Status post cholecystectomy otherwise unremarkable right upper quadrant ultrasound. Electronically Signed   By: Laren Everts.D.  On: 08/22/2015 02:45   Dg Ercp Biliary & Pancreatic Ducts  08/24/2015  CLINICAL DATA:  34 year old female with a history of choledocholithiasis EXAM: ERCP TECHNIQUE: Multiple spot images obtained with the fluoroscopic device and submitted for interpretation post-procedure. FLUOROSCOPY TIME:  Fluoroscopy Time:  1  minutes 3 seconds COMPARISON:  MR 08/22/2015 FINDINGS: Initial image demonstrates endoscope projecting over the upper abdomen with cannulation of the ampulla. Initial image demonstrates partial opacification of extrahepatic biliary ducts. Surgical changes of cholecystectomy. Transient filling defects of the distal common bile duct, potentially debris or air bubbles. IMPRESSION: Limited images during ERCP demonstrates transient filling defects of the distal common bile duct, potentially air bubbles or debris/ stones. Please refer to the dictated operative report for full details of intraoperative findings and procedure. Changes of prior cholecystectomy. Signed, Dulcy Fanny. Earleen Newport, DO [facility] Electronically Signed   By: Corrie Mckusick D.O.   On: 08/24/2015 15:19   Mr Jeananne Rama W/wo Cm/mrcp  08/23/2015  CLINICAL DATA:  Epigastric pain with nausea and vomiting. Right seen LFTs. Cholecystectomy 6 weeks ago. EXAM: MRI ABDOMEN WITHOUT AND WITH CONTRAST (INCLUDING MRCP) TECHNIQUE: Multiplanar multisequence MR imaging of the abdomen was performed both before and after the administration of intravenous contrast. Heavily T2-weighted images of the biliary and pancreatic ducts were obtained, and three-dimensional MRCP images were rendered by post processing. CONTRAST:  19mL MULTIHANCE GADOBENATE DIMEGLUMINE 529 MG/ML IV SOLN COMPARISON:  Ultrasound 08/22/2015 and 07/07/2015. FINDINGS: Lower chest:  Unremarkable. Hepatobiliary: Status post cholecystectomy. Interval development of mild intra and extrahepatic biliary dilatation. The common hepatic duct measures up to 12 mm in diameter. The duct tapers distally. No intraductal calculi demonstrated. There is probable mild periportal edema. The liver otherwise appears normal without focal lesion or abnormal enhancement. Pancreas: The pancreas appears normal. There is no evidence of pancreatic mass, pancreatic ductal dilatation  or surrounding inflammatory change. The pancreas enhances homogeneously. Spleen: Normal in size without focal abnormality. Adrenals/Urinary Tract: Both adrenal glands appear normal. The kidneys appear normal without evidence of urinary tract calculus or hydronephrosis. Bladder not imaged. Stomach/Bowel: No evidence of bowel wall thickening, distention or surrounding inflammatory change. Vascular/Lymphatic: There are no enlarged abdominal lymph nodes. No significant vascular findings are present. The portal, superior mesenteric and splenic veins appear normal. Other: None. Musculoskeletal: No acute or significant osseous findings. IMPRESSION: 1. Interval development of intra and extrahepatic biliary dilatation. No evidence of choledocholithiasis. Findings suggest recent passage of a common duct stone. Continued surveillance of liver function studies recommended. Findings were discussed with Dr. Oletta Lamas. 2. The cholecystectomy bed and liver otherwise appear unremarkable. 3. No evidence of pancreatitis. Electronically Signed   By: Richardean Sale M.D.   On: 08/23/2015 08:34    Microbiology: Recent Results (from the past 240 hour(s))  Culture, blood (Routine X 2) w Reflex to ID Panel     Status: None (Preliminary result)   Collection Time: 08/23/15  7:00 AM  Result Value Ref Range Status   Specimen Description BLOOD RIGHT ANTECUBITAL  Final   Special Requests BOTTLES DRAWN AEROBIC AND ANAEROBIC 5CCS  Final   Culture NO GROWTH 3 DAYS  Final   Report Status PENDING  Incomplete  Culture, blood (Routine X 2) w Reflex to ID Panel     Status: None (Preliminary result)   Collection Time: 08/23/15  7:10 AM  Result Value Ref Range Status   Specimen Description BLOOD RIGHT ANTECUBITAL  Final   Special Requests BOTTLES DRAWN AEROBIC AND ANAEROBIC 10CCS  Final   Culture NO GROWTH 3 DAYS  Final   Report Status PENDING  Incomplete     Labs: Basic Metabolic Panel:  Recent Labs Lab 08/21/15 1957 08/23/15 0700  08/24/15 0600 08/25/15 0439 08/26/15 0822  NA 139 137 141 140 137  K 3.6 3.8 3.3* 3.7 4.0  CL 106 102 106 106 104  CO2 23 25 27 26 24   GLUCOSE 159* 90 81 74 86  BUN <5* <5* <5* <5* 5*  CREATININE 0.71 0.63 0.56 0.48 0.60  CALCIUM 8.8* 8.3* 8.4* 8.4* 8.7*   Liver Function Tests:  Recent Labs Lab 08/22/15 1050 08/23/15 0700 08/24/15 0600 08/25/15 0439 08/26/15 0822  AST 112* 187* 201* 140* 144*  ALT 134* 204* 217* 204* 203*  ALKPHOS 165* 200* 177* 176* 209*  BILITOT 4.1* 5.7* 5.4* 5.2* 4.8*  PROT 6.9 6.4* 5.6* 5.9* 6.6  ALBUMIN 3.5 3.0* 2.7* 2.7* 2.9*    Recent Labs Lab 08/21/15 1957  LIPASE 22   No results for input(s): AMMONIA in the last 168 hours. CBC:  Recent Labs Lab 08/21/15 1957 08/23/15 0700 08/24/15 0600  WBC 6.3 8.6 4.4  HGB 11.3* 10.7* 9.5*  HCT 36.4 33.5* 30.0*  MCV 77.3* 77.4* 77.3*  PLT 255 185 151   Cardiac Enzymes: No results for input(s): CKTOTAL, CKMB, CKMBINDEX, TROPONINI in the last 168 hours. BNP: BNP (last 3 results) No results for input(s): BNP in the last 8760 hours.  ProBNP (last 3 results) No results for input(s): PROBNP in the last 8760 hours.  CBG: No results for input(s): GLUCAP in the last 168 hours.  Time coordinating discharge: 35 minutes  Signed:  Zohal Reny  [facility] 08/26/2015, 12:25 PM

## 2015-08-26 NOTE — Progress Notes (Signed)
[name] [name] to be D/C'd Home per MD order.  Discussed prescriptions and follow up appointments with the patient. Prescriptions given to patient, medication list explained in detail. Pt verbalized understanding.    Medication List    TAKE these medications        ALPRAZolam 0.5 MG tablet  Commonly known as:  XANAX  Take 0.5 mg by mouth 4 (four) times daily as needed for anxiety.     amphetamine-dextroamphetamine 20 MG tablet  Commonly known as:  ADDERALL  Take 20 mg by mouth 3 (three) times daily.     ferrous sulfate 325 (65 FE) MG tablet  Take 1 tablet (325 mg total) by mouth 2 (two) times daily with a meal.     MICROGESTIN FE 1/20 1-20 MG-MCG tablet  Generic drug:  norethindrone-ethinyl estradiol  Take 1 tablet by mouth daily.     traMADol 50 MG tablet  Commonly known as:  ULTRAM  Take 1 tablet (50 mg total) by mouth every 6 (six) hours as needed.        Filed Vitals:   08/26/15 0455 08/26/15 0932  BP: 136/71 134/73  Pulse: 57 82  Temp: 98.2 F (36.8 C) 98.1 F (36.7 C)  Resp: 18 18    Skin clean, dry and intact without evidence of skin break down, no evidence of skin tears noted. IV catheter discontinued intact. Site without signs and symptoms of complications. Dressing and pressure applied. Pt denies pain at this time. No complaints noted.  An After Visit Summary was printed and given to the patient. Patient escorted via [place], and D/C home via private auto.  [name] C 08/26/2015 2:22 PM

## 2015-08-27 ENCOUNTER — Telehealth: Payer: Self-pay

## 2015-08-27 NOTE — Telephone Encounter (Signed)
PCP: Nance Pear., NP  Admit date: 08/22/2015 Discharge date: 08/26/2015  Recommendations for Outpatient Follow-up:  1. Follow-up with Dr. Amedeo Plenty, [facility] gastroenterology, within 2-4 weeks. Please repeat CMP. 2. Advised continuation of soft, bland, low-fat diet and then gradually advance as tolerated 3. Follow-up with primary care doctor or psychiatrist. Anxiety issues may be related to frequent dosing of Adderall.  4. Repeat iron studies and hemoglobin in 2-4 weeks  Discharge Diagnoses:  Principal Problem:  Common bile duct stone Active Problems:  ANXIETY DEPRESSION  Attention deficit disorder  Upper abdominal pain  Microcytic anemia  Transaminitis  Abdominal pain  Bile duct calculus  Left a message for call back.

## 2015-08-28 LAB — CULTURE, BLOOD (ROUTINE X 2)
CULTURE: NO GROWTH
Culture: NO GROWTH

## 2015-09-01 ENCOUNTER — Other Ambulatory Visit: Payer: BLUE CROSS/BLUE SHIELD

## 2015-09-01 ENCOUNTER — Encounter: Payer: Self-pay | Admitting: Family

## 2015-09-01 ENCOUNTER — Ambulatory Visit (INDEPENDENT_AMBULATORY_CARE_PROVIDER_SITE_OTHER): Payer: BLUE CROSS/BLUE SHIELD | Admitting: Family

## 2015-09-01 VITALS — BP 100/60 | HR 70 | Temp 98.0°F | Resp 16 | Ht 65.75 in | Wt 173.2 lb

## 2015-09-01 DIAGNOSIS — F341 Dysthymic disorder: Secondary | ICD-10-CM

## 2015-09-01 DIAGNOSIS — K8051 Calculus of bile duct without cholangitis or cholecystitis with obstruction: Secondary | ICD-10-CM

## 2015-09-01 DIAGNOSIS — K8050 Calculus of bile duct without cholangitis or cholecystitis without obstruction: Secondary | ICD-10-CM

## 2015-09-01 DIAGNOSIS — D509 Iron deficiency anemia, unspecified: Secondary | ICD-10-CM

## 2015-09-01 LAB — CBC WITH DIFFERENTIAL/PLATELET
BASOS PCT: 0.5 % (ref 0.0–3.0)
Basophils Absolute: 0 10*3/uL (ref 0.0–0.1)
EOS PCT: 2.4 % (ref 0.0–5.0)
Eosinophils Absolute: 0.1 10*3/uL (ref 0.0–0.7)
HEMATOCRIT: 35.5 % — AB (ref 36.0–46.0)
Hemoglobin: 11.3 g/dL — ABNORMAL LOW (ref 12.0–15.0)
LYMPHS PCT: 25.5 % (ref 12.0–46.0)
Lymphs Abs: 1.5 10*3/uL (ref 0.7–4.0)
MCHC: 31.9 g/dL (ref 30.0–36.0)
MCV: 77.4 fl — AB (ref 78.0–100.0)
MONOS PCT: 6.1 % (ref 3.0–12.0)
Monocytes Absolute: 0.4 10*3/uL (ref 0.1–1.0)
NEUTROS ABS: 3.8 10*3/uL (ref 1.4–7.7)
Neutrophils Relative %: 65.5 % (ref 43.0–77.0)
PLATELETS: 364 10*3/uL (ref 150.0–400.0)
RBC: 4.59 Mil/uL (ref 3.87–5.11)
RDW: 21.2 % — AB (ref 11.5–15.5)
WBC: 5.8 10*3/uL (ref 4.0–10.5)

## 2015-09-01 LAB — LIPASE: Lipase: 34 U/L (ref 7–60)

## 2015-09-01 LAB — HEPATIC FUNCTION PANEL
ALBUMIN: 4 g/dL (ref 3.5–5.2)
ALT: 252 U/L — ABNORMAL HIGH (ref 0–35)
AST: 133 U/L — ABNORMAL HIGH (ref 0–37)
Alkaline Phosphatase: 168 U/L — ABNORMAL HIGH (ref 39–117)
Bilirubin, Direct: 0.9 mg/dL — ABNORMAL HIGH (ref 0.0–0.3)
TOTAL PROTEIN: 7.4 g/dL (ref 6.0–8.3)
Total Bilirubin: 1.6 mg/dL — ABNORMAL HIGH (ref 0.2–1.2)

## 2015-09-01 LAB — IRON: Iron: 55 ug/dL (ref 42–145)

## 2015-09-01 NOTE — Progress Notes (Signed)
Pre visit review using our clinic review tool, if applicable. No additional management support is needed unless otherwise documented below in the visit note. 

## 2015-09-01 NOTE — Assessment & Plan Note (Addendum)
Will obtain iron level, advised pt to start iron 325mg  bid, add colace prn constipation. Plan to repeat CBC in 3 months at her cpx.

## 2015-09-01 NOTE — Progress Notes (Signed)
Subjective:    Patient ID: [name] [name], female    DOB: 02-02-1981, 35 y.o.   MRN: [id number]  HPI  [name] is a 35 yr old female who presents today to establish care.  She was recently was hospitalized 08/22/15-08/26/15 with epigastric discomfort following cholecystectomy on 11/9.  She was noted to have elevated total bilirubin, AST and ALT.  She underwent ERCP with sphincterotomy on 12/27 by Dr. Teena Irani.  LFT's were trending downward at time of her discharge form the hospital.  She has follow up with Dr. Amedeo Plenty.    Microcytic anemia-  It was recommended that she start a once daily iron supplement. She is not currently taking  Anxiety/ADHD- on adderral.  Previously she was on adderall 20mg  tid.  She is followed by Dr. Toy Care (psychiatry). Notes that she was anxious in the hospital but is feeling better now.  Rarely needs to use xanax. She has a 35 year old and a 21 month old.    Review of Systems  Constitutional: Negative for unexpected weight change.  HENT:       Mild rhinorrhea  Respiratory: Negative for cough.   Cardiovascular: Negative for leg swelling.  Gastrointestinal: Negative for constipation.  Genitourinary: Negative for dysuria and frequency.  Musculoskeletal: Negative for myalgias and arthralgias.       Mild low back pain  Skin: Negative for rash.  Neurological:       Reports + hx of migraines, not recently. Has had less severe headaches  Psychiatric/Behavioral:       See HPI   Past Medical History  Diagnosis Date  . Headache(784.0)     migraine  . History of condyloma acuminatum   . History of chicken pox   . Anxiety   . Maternal iron deficiency anemia 05/05/2015  . ADHD (attention deficit hyperactivity disorder)   . Gestational diabetes     glyburide  . Depression     weaned off meds during pregnancy    Social History   Social History  . Marital Status: Married    Spouse Name: N/A  . Number of Children: N/A  . Years of Education: N/A    Occupational History  . Not on file.   Social History Main Topics  . Smoking status: Current Some Day Smoker    Last Attempt to Quit: 10/18/2010  . Smokeless tobacco: Never Used     Comment: smokes socially (1 cigarette a week)  . Alcohol Use: No  . Drug Use: No  . Sexual Activity: Yes   Other Topics Concern  . Not on file   Social History Narrative    Past Surgical History  Procedure Laterality Date  . Knee surgery      L meniscus tear  . Cholecystectomy N/A 07/07/2015    Procedure: LAPAROSCOPIC CHOLECYSTECTOMY;  Surgeon: Mickeal Skinner, MD;  Location: [place];  Service: General;  Laterality: N/A;  . Ercp N/A 08/24/2015    Procedure: ENDOSCOPIC RETROGRADE CHOLANGIOPANCREATOGRAPHY (ERCP);  Surgeon: Teena Irani, MD;  Location: [facility] ENDOSCOPY;  Service: Endoscopy;  Laterality: N/A;  . Cesarean section  06/25/2011    Procedure: CESAREAN SECTION;  Surgeon: Marvene Staff, MD;  Location: [facility];  Service: Gynecology;  Laterality: N/A;  primary of baby girl  at  58 APGAR 8/9  . Cesarean section N/A 05/04/2015    Procedure: CESAREAN SECTION;  Surgeon: Aloha Gell, MD;  Location: [facility];  Service: Obstetrics;  Laterality: N/A;    Family History  Problem  Relation Age of Onset  . Anesthesia problems Neg Hx   . Hypotension Neg Hx   . Malignant hyperthermia Neg Hx   . Pseudochol deficiency Neg Hx   . Hypertension Mother   . Migraines Mother   . Other Mother     DDD  . Hypertension Father   . Migraines Sister   . Heart disease Maternal Grandmother   . Heart disease Maternal Grandfather   . Diabetes Paternal Grandmother   . Cancer Paternal Grandfather     prostate and throat    Allergies  Allergen Reactions  . Ibuprofen Hives and Other (See Comments)    stuffiness  . Food     Nuts and Yeast  . Latex Hives  . Lovenox [Enoxaparin Sodium] Other (See Comments)    Face flush and redness  . Other Hives    CATS  . Penicillins Hives  . Yellow Dyes  (Non-Tartrazine) Hives    YELLOW No. 5    Current Outpatient Prescriptions on File Prior to Visit  Medication Sig Dispense Refill  . ALPRAZolam (XANAX) 0.5 MG tablet Take 0.5 mg by mouth 4 (four) times daily as needed for anxiety.   5  . amphetamine-dextroamphetamine (ADDERALL) 20 MG tablet Take 20 mg by mouth 3 (three) times daily.    . ferrous sulfate 325 (65 FE) MG tablet Take 1 tablet (325 mg total) by mouth 2 (two) times daily with a meal. 60 tablet 0  . MICROGESTIN FE 1/20 1-20 MG-MCG tablet Take 1 tablet by mouth daily.  11  . traMADol (ULTRAM) 50 MG tablet Take 1 tablet (50 mg total) by mouth every 6 (six) hours as needed. 15 tablet 0   No current facility-administered medications on file prior to visit.    BP 100/60 mmHg  Pulse 70  Temp(Src) 98 F (36.7 C) (Oral)  Resp 16  Ht 5' 5.75" (1.67 m)  Wt 173 lb 3.2 oz (78.563 kg)  BMI 28.17 kg/m2  SpO2 98%  LMP 08/21/2015       Objective:   Physical Exam  Constitutional: She is oriented to person, place, and time. She appears well-developed and well-nourished.  HENT:  Head: Normocephalic and atraumatic.  Cardiovascular: Normal rate, regular rhythm and normal heart sounds.   No murmur heard. Pulmonary/Chest: Effort normal and breath sounds normal. No respiratory distress. She has no wheezes.  Abdominal:  Mild epigastric tenderness to palpation  Musculoskeletal: She exhibits no edema.  Neurological: She is alert and oriented to person, place, and time.  Skin: Skin is warm.  Psychiatric: She has a normal mood and affect. Her behavior is normal. Judgment and thought content normal.          Assessment & Plan:

## 2015-09-01 NOTE — Assessment & Plan Note (Signed)
Stable, management per psychiatry.

## 2015-09-01 NOTE — Patient Instructions (Addendum)
Please complete lab work prior to leaving. Add iron 325mg  twice daily. Please schedule a complete physical in the next 3 months.  Welcome Back!

## 2015-09-01 NOTE — Assessment & Plan Note (Signed)
S/p ERCP with sphincterotomy on 12/27, clinically improving, obtain follow up LFT's to follow trend.

## 2015-09-02 ENCOUNTER — Encounter: Payer: Self-pay | Admitting: Family

## 2015-09-02 DIAGNOSIS — R519 Headache, unspecified: Secondary | ICD-10-CM

## 2015-09-02 DIAGNOSIS — R51 Headache: Principal | ICD-10-CM

## 2015-09-02 MED ORDER — FERROUS SULFATE 325 (65 FE) MG PO TABS: 325.0000 mg | ORAL_TABLET | Freq: Every day | ORAL | Status: DC

## 2015-09-03 NOTE — Telephone Encounter (Signed)
See mychart.  

## 2015-09-03 NOTE — Addendum Note (Signed)
Addended by: Debbrah Alar on: 09/03/2015 12:38 PM   Modules accepted: Orders

## 2015-09-03 NOTE — Telephone Encounter (Signed)
Reviewed labs with patient. Reports that she continues to feel well. Advised pt that I routed labs to her GI and advised her to keep her upcoming appointment. She verbalizes understanding.

## 2015-09-06 ENCOUNTER — Encounter: Payer: Self-pay | Admitting: Neurology

## 2015-09-06 ENCOUNTER — Ambulatory Visit: Payer: BLUE CROSS/BLUE SHIELD | Admitting: Neurology

## 2015-09-06 ENCOUNTER — Ambulatory Visit (INDEPENDENT_AMBULATORY_CARE_PROVIDER_SITE_OTHER): Payer: BLUE CROSS/BLUE SHIELD | Admitting: Neurology

## 2015-09-06 VITALS — BP 128/80 | HR 89 | Ht 66.0 in | Wt 173.0 lb

## 2015-09-06 DIAGNOSIS — G43109 Migraine with aura, not intractable, without status migrainosus: Secondary | ICD-10-CM

## 2015-09-06 DIAGNOSIS — R51 Headache: Secondary | ICD-10-CM | POA: Diagnosis not present

## 2015-09-06 DIAGNOSIS — R519 Headache, unspecified: Secondary | ICD-10-CM | POA: Insufficient documentation

## 2015-09-06 MED ORDER — CYCLOBENZAPRINE HCL 5 MG PO TABS: 5.0000 mg | ORAL_TABLET | Freq: Every day | ORAL | Status: DC | PRN

## 2015-09-06 MED ORDER — TOPIRAMATE 25 MG PO TABS
25.0000 mg | ORAL_TABLET | Freq: Every day | ORAL | Status: DC
Start: 2015-09-06 — End: 2017-02-16

## 2015-09-06 NOTE — Patient Instructions (Signed)
1.  Start topiramate 25mg  daily 2.  For acute worsening headaches, take cyclobenzaprine 5mg  as needed.  Limit to three times per week 3.  Return to clinic 3 months

## 2015-09-06 NOTE — Progress Notes (Signed)
[facility] Clinic Note - Initial Visit   Date: 09/06/2015  [name] [name] MRN: [id number] DOB: Jan 10, 1981   Dear Debbrah Alar, NP:  Thank you for your kind referral of [name] [name] for consultation of headaches. Although her history is well known to you, please allow [place] to reiterate it for the purpose of our medical record. The patient was accompanied to the clinic by self.    History of Present Illness: [name] [name] is a 35 y.o. right-handed Caucasian female with ADHD, anxiety, and recent cholecystectomy (06/2015) presenting for evaluation of headaches.    She reports having migraines very infrequently before she was pregnant, maybe 1-2 times every few years.  Over the past year, she was pregnant with her second child, and the frequency of her headaches has worsened.  Pain can vary in location and occur over the base of her head and sometimes bifrontally.  Headaches occur 3-4 times per week, lasting 2-3 hours.  She was previously tylenol which alleviated her pain.  She does not have associated nausea or vomiting with the headaches.  She has mild photophobia.  She is usually able to continue with her activities, unless headaches are severe.  She had tried using a hot wash cloth and massage therapy which did not help.  She rarely gets migraine described as pulsating and severe pain which are preceded by visual auras.  She gets these about 1-2 times per year and was worse with her last pregnancy.  She is not nursing.  Mother and sister also get migraines.   She recently underwent cholecystectomy and was found to have retained bile stone, but has had elevated liver and biliary labs so is unable to take tylenol.  She has NSAID allergy.    Out-side paper records, electronic medical record, and images have been reviewed where available and summarized as:  XR cervical spine 12/06/2009:  Straightening of the normal cervical lordosis. No fracture or subluxation.  No significant degenerative changes.  Lab Results  Component Value Date   ALT 252* 09/01/2015   AST 133* 09/01/2015   ALKPHOS 168* 09/01/2015   BILITOT 1.6* 09/01/2015    Past Medical History  Diagnosis Date  . Headache(784.0)     migraine  . History of condyloma acuminatum   . History of chicken pox   . Anxiety   . Maternal iron deficiency anemia 05/05/2015  . ADHD (attention deficit hyperactivity disorder)   . Gestational diabetes     glyburide  . Depression     weaned off meds during pregnancy    Past Surgical History  Procedure Laterality Date  . Knee surgery      L meniscus tear  . Cholecystectomy N/A 07/07/2015    Procedure: LAPAROSCOPIC CHOLECYSTECTOMY;  Surgeon: Mickeal Skinner, MD;  Location: [place];  Service: General;  Laterality: N/A;  . Ercp N/A 08/24/2015    Procedure: ENDOSCOPIC RETROGRADE CHOLANGIOPANCREATOGRAPHY (ERCP);  Surgeon: Teena Irani, MD;  Location: [facility] ENDOSCOPY;  Service: Endoscopy;  Laterality: N/A;  . Cesarean section  06/25/2011    Procedure: CESAREAN SECTION;  Surgeon: Marvene Staff, MD;  Location: [facility];  Service: Gynecology;  Laterality: N/A;  primary of baby girl  at  92 APGAR 8/9  . Cesarean section N/A 05/04/2015    Procedure: CESAREAN SECTION;  Surgeon: Aloha Gell, MD;  Location: [facility];  Service: Obstetrics;  Laterality: N/A;     Medications:  Outpatient Encounter Prescriptions as of 09/06/2015  Medication Sig  . ALPRAZolam (  XANAX) 0.5 MG tablet Take 0.5 mg by mouth 4 (four) times daily as needed for anxiety.   Marland Kitchen amphetamine-dextroamphetamine (ADDERALL) 20 MG tablet Take 20 mg by mouth 3 (three) times daily.  . ferrous sulfate 325 (65 FE) MG tablet Take 1 tablet (325 mg total) by mouth daily with breakfast.  . MICROGESTIN FE 1/20 1-20 MG-MCG tablet Take 1 tablet by mouth daily.  . cyclobenzaprine (FLEXERIL) 5 MG tablet Take 1 tablet (5 mg total) by mouth daily as needed for muscle spasms (Limit use to 3 times per week).  .  topiramate (TOPAMAX) 25 MG tablet Take 1 tablet (25 mg total) by mouth daily.  . [DISCONTINUED] traMADol (ULTRAM) 50 MG tablet Take 1 tablet (50 mg total) by mouth every 6 (six) hours as needed.   No facility-administered encounter medications on file as of 09/06/2015.     Allergies:  Allergies  Allergen Reactions  . Ibuprofen Hives and Other (See Comments)    stuffiness  . Food     Nuts and Yeast  . Latex Hives  . Lovenox [Enoxaparin Sodium] Other (See Comments)    Face flush and redness  . Other Hives    CATS  . Penicillins Hives  . Yellow Dyes (Non-Tartrazine) Hives    YELLOW No. 5    Family History: Family History  Problem Relation Age of Onset  . Anesthesia problems Neg Hx   . Hypotension Neg Hx   . Malignant hyperthermia Neg Hx   . Pseudochol deficiency Neg Hx   . Hypertension Mother   . Migraines Mother   . Other Mother     DDD  . Hypertension Father   . Migraines Sister   . Heart disease Maternal Grandmother   . Heart disease Maternal Grandfather   . Diabetes Paternal Grandmother   . Cancer Paternal Grandfather     prostate and throat  . Healthy Son   . Healthy Daughter     Social History: Social History  Substance Use Topics  . Smoking status: Current Some Day Smoker  . Smokeless tobacco: Never Used     Comment: smokes socially (1 cigarette a week)  . Alcohol Use: No   Social History   Social History Narrative   2012- daughter   2016- son   Married   Does not work.  She was previously a Emergency planning/management officer, last worked in 2012.    Enjoys reading, shopping, beach    Review of Systems:  CONSTITUTIONAL: No fevers, chills, night sweats, or weight loss.   EYES: No visual changes or eye pain ENT: No hearing changes.  No history of nose bleeds.   RESPIRATORY: No cough, wheezing and shortness of breath.   CARDIOVASCULAR: Negative for chest pain, and palpitations.   GI: Negative for abdominal discomfort, blood in stools or black stools.  No recent  change in bowel habits.   GU:  No history of incontinence.   MUSCLOSKELETAL: No history of joint pain or swelling.  No myalgias.   SKIN: Negative for lesions, rash, and itching.   HEMATOLOGY/ONCOLOGY: Negative for prolonged bleeding, bruising easily, and swollen nodes.  No history of cancer.   ENDOCRINE: Negative for cold or heat intolerance, polydipsia or goiter.   PSYCH:  No depression +anxiety symptoms.   NEURO: As Above.   Vital Signs:  BP 128/80 mmHg  Pulse 89  Ht 5\' 6"  (1.676 m)  Wt 173 lb (78.472 kg)  BMI 27.94 kg/m2  SpO2 99%  LMP 08/21/2015  General Medical Exam:  General:  Well appearing, comfortable.   Eyes/ENT: see cranial nerve examination.   Neck: No masses appreciated.  Full range of motion without tenderness.  No carotid bruits. Respiratory:  Clear to auscultation, good air entry bilaterally.   Cardiac:  Regular rate and rhythm, no murmur.   Extremities:  No deformities, edema, or skin discoloration.  Skin:  No rashes or lesions.  Neurological Exam: MENTAL STATUS including orientation to time, place, person, recent and remote memory, attention span and concentration, language, and fund of knowledge is normal.  Speech is not dysarthric.  CRANIAL NERVES: II:  No visual field defects.  Unremarkable fundi.   III-IV-VI: Pupils equal round and reactive to light.  Normal conjugate, extra-ocular eye movements in all directions of gaze.  No nystagmus.  No ptosis.   V:  Normal facial sensation.     VII:  Normal facial symmetry and movements.  No pathologic facial reflexes.  VIII:  Normal hearing and vestibular function.   IX-X:  Normal palatal movement.   XI:  Normal shoulder shrug and head rotation.   XII:  Normal tongue strength and range of motion, no deviation or fasciculation.  MOTOR:  No atrophy, fasciculations or abnormal movements.  No pronator drift.  Tone is normal.    Right Upper Extremity:    Left Upper Extremity:    Deltoid  5/5   Deltoid  5/5   Biceps   5/5   Biceps  5/5   Triceps  5/5   Triceps  5/5   Wrist extensors  5/5   Wrist extensors  5/5   Wrist flexors  5/5   Wrist flexors  5/5   Finger extensors  5/5   Finger extensors  5/5   Finger flexors  5/5   Finger flexors  5/5   Dorsal interossei  5/5   Dorsal interossei  5/5   Abductor pollicis  5/5   Abductor pollicis  5/5   Tone (Ashworth scale)  0  Tone (Ashworth scale)  0   Right Lower Extremity:    Left Lower Extremity:    Hip flexors  5/5   Hip flexors  5/5   Hip extensors  5/5   Hip extensors  5/5   Knee flexors  5/5   Knee flexors  5/5   Knee extensors  5/5   Knee extensors  5/5   Dorsiflexors  5/5   Dorsiflexors  5/5   Plantarflexors  5/5   Plantarflexors  5/5   Toe extensors  5/5   Toe extensors  5/5   Toe flexors  5/5   Toe flexors  5/5   Tone (Ashworth scale)  0  Tone (Ashworth scale)  0   MSRs:  Right                                                                 Left brachioradialis 2+  brachioradialis 2+  biceps 2+  biceps 2+  triceps 2+  triceps 2+  patellar 2+  patellar 2+  ankle jerk 2+  ankle jerk 2+  Hoffman no  Hoffman no  plantar response down  plantar response down   SENSORY:  Normal and symmetric perception of light touch, pinprick, vibration, and proprioception.  Romberg's sign absent.   COORDINATION/GAIT: Normal finger-to-  nose-finger and heel-to-shin.  Intact rapid alternating movements bilaterally.  Able to rise from a chair without using arms.  Gait narrow based and stable. Tandem and stressed gait intact.    IMPRESSION: 1.  Chronic daily headaches.   Headaches previously treated with tylenol and provided relief, however now she has transaminitis and is to avoid any hepatotoxic medications.  She does not tolerate NSAIDs.  Because her headache frequency is occuring > 15 days of the month, it is reasonable to add a preventative medication.  Discussed topiramate vs gabapentin and discussed side effect profile as well as efficacy for headache  management.  Although topiramate is metabolized by the liver, it does not cause liver injury and we will keep her on a very low dose.  She does not have any plans for pregnancy and is on oral contraceptive medication. She has been instructed that at doses > 200mg  of topirmate, OCP can be less effective.   2.  Episodic migraine with aura 3.  Transaminitis.  Need to avoid hepatotoxic medications, including tylenol. She is seeing GI later this month for evaluation.   PLAN/RECOMMENDATIONS:  1.  For preventative therapy, start topiramate 25mg  daily.  Side effects discussed. 2.  For abortive therapy, ok to use flexeril 5mg  as needed.  Limit to three times per week 3.  Do not use NSAIDs or tylenol.  Return to clinic in 3 months.   The duration of this appointment visit was 45 minutes of face-to-face time with the patient.  Greater than 50% of this time was spent in counseling, explanation of diagnosis, planning of further management, and coordination of care.   Thank you for allowing me to participate in patient's care.  If I can answer any additional questions, I would be pleased to do so.    Sincerely,    Jearline Hirschhorn K. Posey Pronto, DO

## 2015-09-13 ENCOUNTER — Encounter: Payer: Self-pay | Admitting: Neurology

## 2015-09-14 NOTE — Telephone Encounter (Signed)
Please provide patient with Trokendi 25mg  samples - take 1 tablet daily.  Mitchell Epling K. Posey Pronto, DO

## 2015-09-21 ENCOUNTER — Encounter: Payer: Self-pay | Admitting: Neurology

## 2015-09-27 MED ORDER — CYCLOBENZAPRINE HCL 5 MG PO TABS: 5.0000 mg | ORAL_TABLET | Freq: Every evening | ORAL | Status: DC | PRN

## 2015-09-27 NOTE — Addendum Note (Signed)
Addended by: Narda Amber K on: 09/27/2015 08:00 AM   Modules accepted: Orders

## 2015-10-11 ENCOUNTER — Encounter: Payer: Self-pay | Admitting: Family

## 2015-10-13 ENCOUNTER — Ambulatory Visit: Payer: BLUE CROSS/BLUE SHIELD | Admitting: Medical

## 2015-11-09 ENCOUNTER — Encounter: Payer: Self-pay | Admitting: Family

## 2015-12-01 ENCOUNTER — Encounter: Payer: BLUE CROSS/BLUE SHIELD | Admitting: Family

## 2015-12-06 ENCOUNTER — Ambulatory Visit: Payer: BLUE CROSS/BLUE SHIELD | Admitting: Neurology

## 2017-01-12 IMAGING — MR MR 3D RECON AT [facility]
16 series · 16 of 16 positions shown · IV contrast (multihance)
Comparison: Ultrasound 08/22/2015 and 07/07/2015.

CLINICAL DATA: Epigastric pain with nausea and vomiting. Right seen
LFTs. Cholecystectomy 6 weeks ago.

EXAM:
MRI ABDOMEN WITHOUT AND WITH CONTRAST (INCLUDING MRCP)
TECHNIQUE: Multiplanar multisequence MR imaging of the abdomen was performed
both before and after the administration of intravenous contrast.
Heavily T2-weighted images of the biliary and pancreatic ducts were
obtained, and three-dimensional MRCP images were rendered by post
processing.
CONTRAST:  17mL MULTIHANCE GADOBENATE DIMEGLUMINE 529 MG/ML IV SOLN

[Series 4: T2 fat-sat · axial · 5.0mm · 0.78mm/px · 1 of 57 slices shown]
[im 1/57]
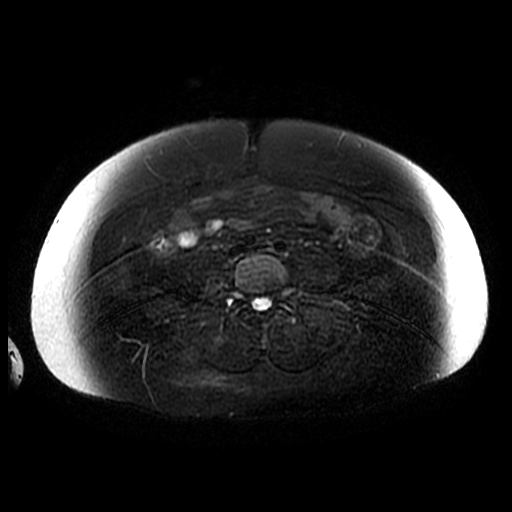

[Series 5: T2 · axial · 5.0mm · 0.78mm/px · 1 of 58 slices shown (1 of 2)]
[im 1/58]
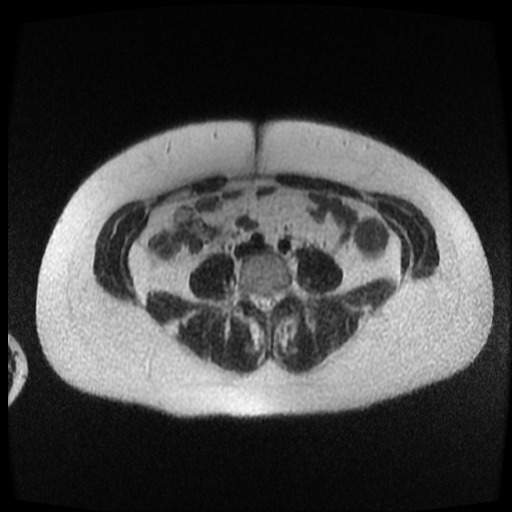

[Series 6: T2 · coronal · 5.0mm · 0.82mm/px · 1 of 45 slices shown (2 of 2)]
[im 1/45]
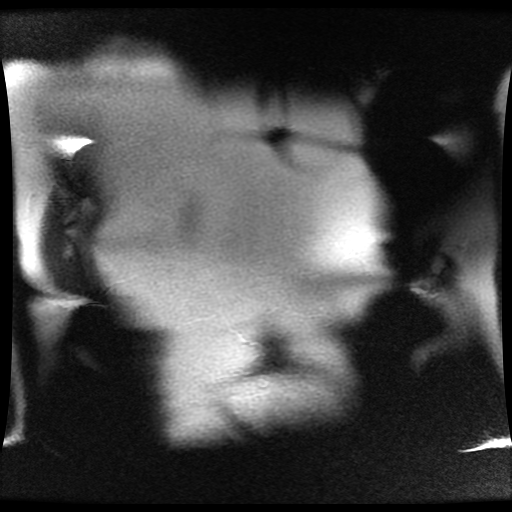

[Series 7: MRCP · coronal · 1.6mm · 0.62mm/px · 1 of 108 slices shown (1 of 3)]
[im 1/108]
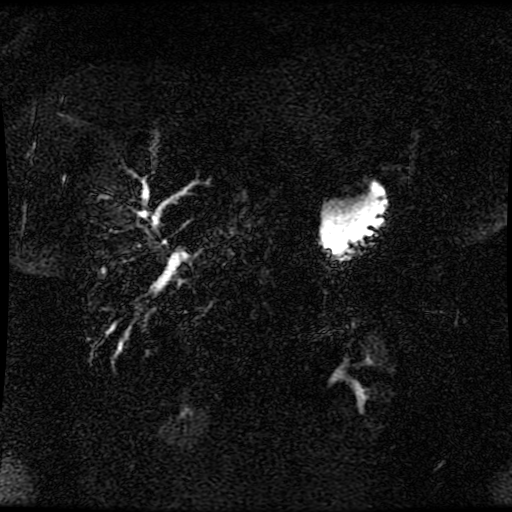

[Series 8: MRCP · coronal · 2.0mm · 0.70mm/px · 1 of 40 slices shown (2 of 3)]
[im 1/40]
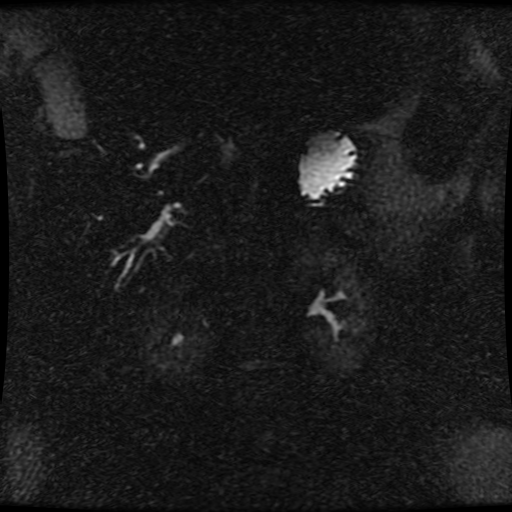

[Series 9: DWI b500 · axial · 6.0mm · 1.48mm/px · 1 of 74 slices shown]
[im 1/74]
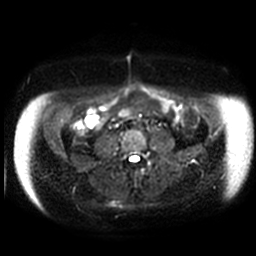

[Series 12: ax dualecho · axial · 5.0mm · 0.78mm/px · 1 of 106 slices shown]
[im 1/106]
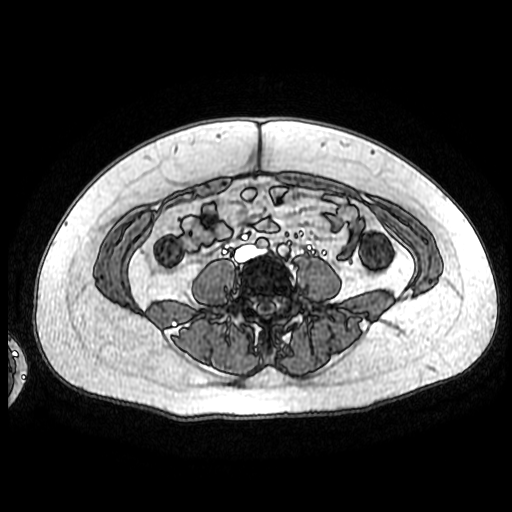

[Series 13: MRCP · sagittal · 50.0mm · 0.70mm/px · 1 of 8 slices shown (3 of 3)]
[im 1/8]
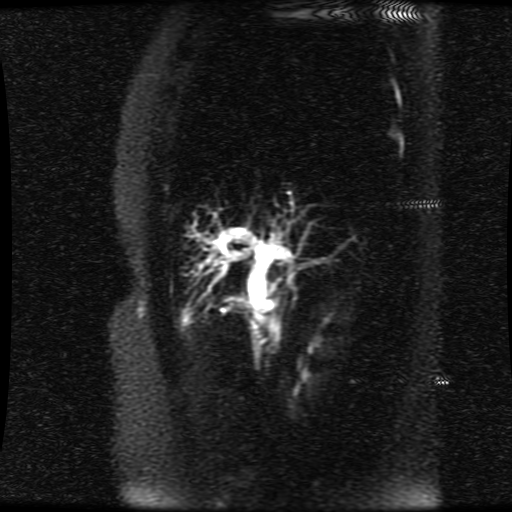

[Series 15: T1 dynamic post-contrast · coronal · 5.0mm · 0.78mm/px · 1 of 88 slices shown]
[im 1/88]
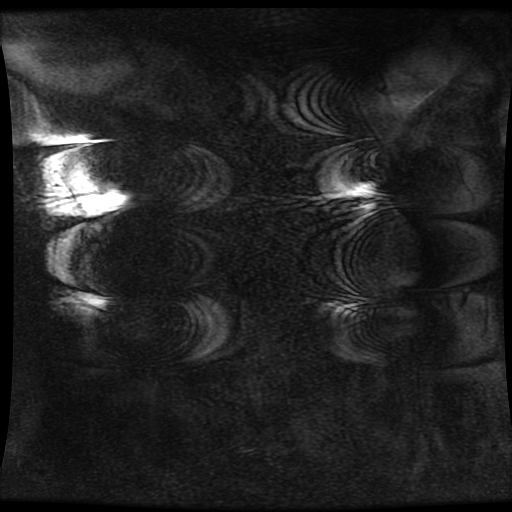

[Series 900: DWI · axial · 6.0mm · 1.48mm/px · 1 of 36 slices shown (1 of 2)]
[im 1/36]
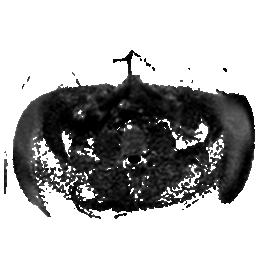

[Series 901: DWI · axial · 6.0mm · 1.48mm/px · 1 of 37 slices shown (2 of 2)]
[im 1/37]
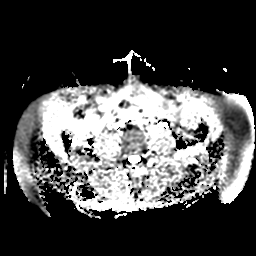

[Series 1400: T1 dynamic · axial · 5.0mm · 0.78mm/px · 1 of 104 slices shown (1 of 5)]
[im 1/104]
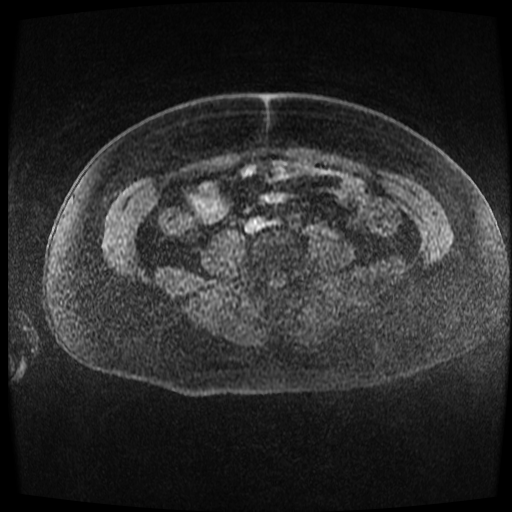

[Series 1401: T1 dynamic · axial · 5.0mm · 0.78mm/px · 1 of 104 slices shown (2 of 5)]
[im 1/104]
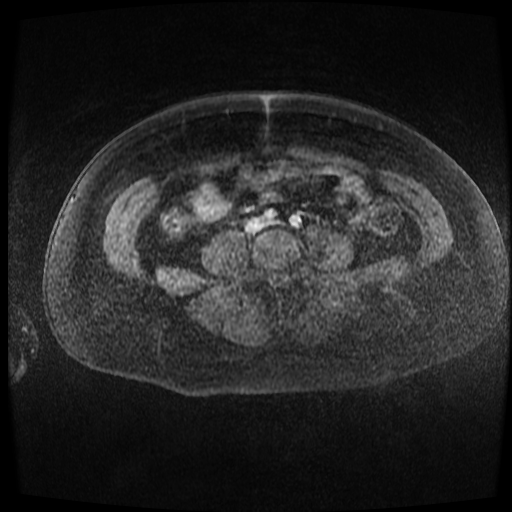

[Series 1402: T1 dynamic · axial · 5.0mm · 0.78mm/px · 1 of 104 slices shown (3 of 5)]
[im 1/104]
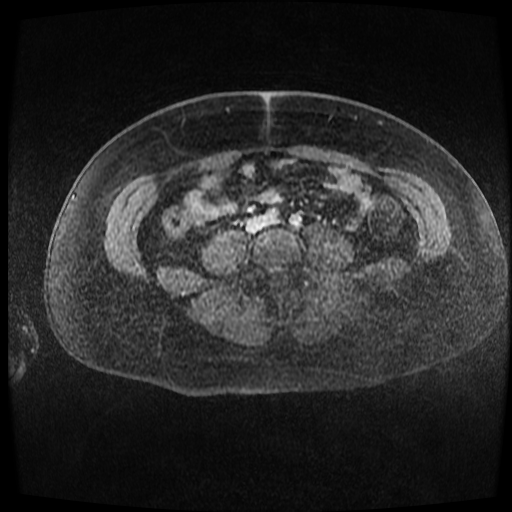

[Series 1403: T1 dynamic · axial · 5.0mm · 0.78mm/px · 1 of 104 slices shown (4 of 5)]
[im 1/104]
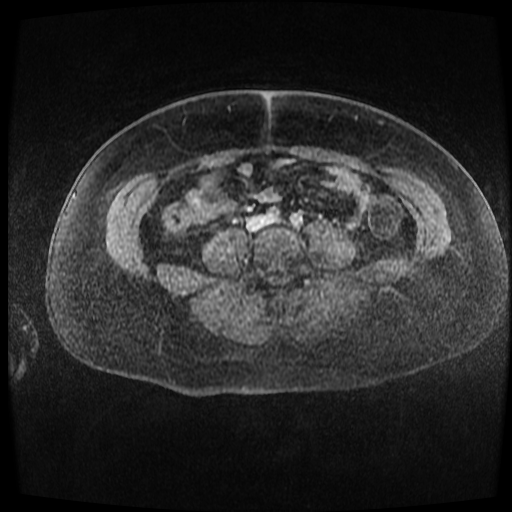

[Series 1404: T1 dynamic · axial · 5.0mm · 0.78mm/px · 1 of 104 slices shown (5 of 5)]
[im 1/104]
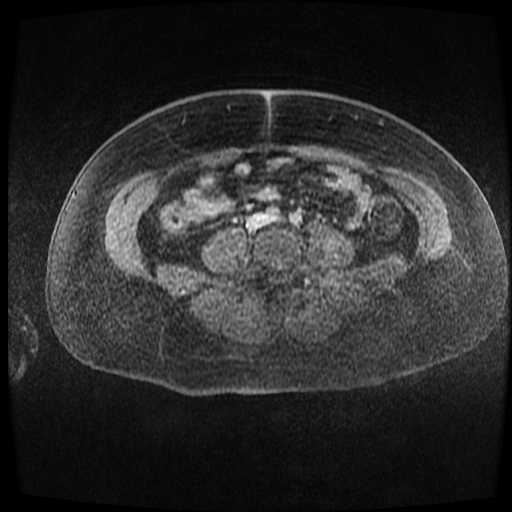

[16 of 16 positions shown; findings below may reference images not displayed]

FINDINGS: Lower chest:  Unremarkable.

Hepatobiliary: Status post cholecystectomy. Interval development of
mild intra and extrahepatic biliary dilatation. The common hepatic
duct measures up to 12 mm in diameter. The duct tapers distally. No
intraductal calculi demonstrated. There is probable mild periportal
edema. The liver otherwise appears normal without focal lesion or
abnormal enhancement.

Pancreas: The pancreas appears normal. There is no evidence of
pancreatic mass, pancreatic ductal dilatation or surrounding
inflammatory change. The pancreas enhances homogeneously.

Spleen: Normal in size without focal abnormality.

Adrenals/Urinary Tract: Both adrenal glands appear normal. The
kidneys appear normal without evidence of urinary tract calculus or
hydronephrosis. Bladder not imaged.

Stomach/Bowel: No evidence of bowel wall thickening, distention or
surrounding inflammatory change.

Vascular/Lymphatic: There are no enlarged abdominal lymph nodes. No
significant vascular findings are present. The portal, superior
mesenteric and splenic veins appear normal.

Other: None.

Musculoskeletal: No acute or significant osseous findings.
IMPRESSION: 1. Interval development of intra and extrahepatic biliary
dilatation. No evidence of choledocholithiasis. Findings suggest
recent passage of a common duct stone. Continued surveillance of
liver function studies recommended. Findings were discussed with Dr.
Ga.
2. The cholecystectomy bed and liver otherwise appear unremarkable.
3. No evidence of pancreatitis.

## 2017-02-16 ENCOUNTER — Encounter: Payer: Self-pay | Admitting: Family Medicine

## 2017-02-16 ENCOUNTER — Ambulatory Visit (INDEPENDENT_AMBULATORY_CARE_PROVIDER_SITE_OTHER): Payer: No Typology Code available for payment source | Admitting: Family Medicine

## 2017-02-16 VITALS — BP 108/68 | HR 100 | Temp 97.4°F | Ht 66.0 in | Wt 162.8 lb

## 2017-02-16 DIAGNOSIS — F339 Major depressive disorder, recurrent, unspecified: Secondary | ICD-10-CM | POA: Diagnosis not present

## 2017-02-16 MED ORDER — ESCITALOPRAM OXALATE 10 MG PO TABS: 10.0000 mg | ORAL_TABLET | Freq: Every day | ORAL | 1 refills | Status: DC

## 2017-02-16 NOTE — Progress Notes (Signed)
[name] [name] is a 36 y.o. female is here to [name].   Patient Care Team: Briscoe Deutscher, DO as PCP - General (Family Medicine) Servando Salina, MD as Consulting Physician (Obstetrics and Gynecology) Chucky May, MD as Consulting Physician (Psychiatry)   History of Present Illness:   [name] acting as scribe for Dr. [name].  HPI Patient comes in today to establish care. She would like to discuss depression today.   Depression Depression symptoms: depressed mood, difficulty concentrating, and anxiety,. Treatment to date has included Medication and followed by [facility]) for ADHD, anxiety.  Symptoms have been unchanged since onset of treatment.  Side effects from the treatment include: none.   Alcohol use: no.  Drug use: no.  Exercise: no.   Patient denies current suicidal and homicidal ideation. Previous medications tried include: Effexor, Prozac, Zoloft, and Buspar with s/e or no relief.   Health Maintenance Due  Topic Date Due  . PAP SMEAR  11/27/2011   Immunization History  Administered Date(s) Administered  . Influenza Whole 06/28/2009  . Influenza,inj,Quad PF,36+ Mos 05/06/2015  . Td 11/05/2009  . Tdap 06/26/2011, 03/08/2015   PMHx, SurgHx, SocialHx, Medications, and Allergies were reviewed in the Visit Navigator and updated as appropriate.   Past Medical History:  Diagnosis Date  . ADHD (attention deficit hyperactivity disorder)   . Anxiety   . Depression   . Gestational diabetes   . History of chicken pox   . History of condyloma acuminatum   . Migraines    Past Surgical History:  Procedure Laterality Date  . CESAREAN SECTION  06/25/2011  . CESAREAN SECTION N/A 05/04/2015  . CHOLECYSTECTOMY N/A 07/07/2015  . ERCP N/A 08/24/2015  . KNEE CARTILAGE SURGERY Left    Family History  Problem Relation Age of Onset  . Hypertension Mother   . Migraines Mother   . Hypertension Father   . Migraines Sister   . Heart disease Maternal  Grandmother   . Heart disease Maternal Grandfather   . Diabetes Paternal Grandmother   . Cancer Paternal Grandfather   . Healthy Son   . Healthy Daughter   . Anesthesia problems Neg Hx   . Hypotension Neg Hx   . Malignant hyperthermia Neg Hx   . Pseudochol deficiency Neg Hx    Social History  Substance Use Topics  . Smoking status: Current Some Day Smoker  . Smokeless tobacco: Never Used     Comment: smokes socially (1 cigarette a week)  . Alcohol use No   Current Medications and Allergies:   Current Outpatient Prescriptions:  .  ALPRAZolam (XANAX) 0.5 MG tablet, Take 0.5 mg by mouth 4 (four) times daily as needed for anxiety. , Disp: , Rfl: 5 .  amphetamine-dextroamphetamine (ADDERALL) 20 MG tablet, Take 20 mg by mouth 3 (three) times daily., Disp: , Rfl:  .  MICROGESTIN FE 1/20 1-20 MG-MCG tablet, Take 1 tablet by mouth daily., Disp: , Rfl: 11  Allergies  Allergen Reactions  . Ibuprofen Hives and Other (See Comments)    stuffiness  . Food     Nuts and Yeast  . Latex Hives  . Lovenox [Enoxaparin Sodium] Other (See Comments)    Face flush and redness  . Other Hives    CATS  . Penicillins Hives  . Yellow Dyes (Non-Tartrazine) Hives    YELLOW No. 5   Review of Systems:   Review of Systems  Constitutional: Negative for chills, fever and malaise/fatigue.  HENT: Positive for ear  pain. Negative for sinus pain and sore throat.   Eyes: Negative for blurred vision and double vision.  Respiratory: Negative for cough, shortness of breath and wheezing.   Cardiovascular: Negative for chest pain, palpitations and leg swelling.  Gastrointestinal: Negative for abdominal pain, nausea and vomiting.  Musculoskeletal: Positive for back pain. Negative for joint pain and neck pain.       Chronic.   Neurological: Positive for headaches. Negative for dizziness.  Psychiatric/Behavioral: Positive for depression. Negative for hallucinations, memory loss and suicidal ideas.   Vitals:    Vitals:   02/16/17 0840  BP: 108/68  Pulse: 100  Temp: 97.4 F (36.3 C)  TempSrc: Oral  SpO2: 97%  Weight: 162 lb 12.8 oz (73.8 kg)  Height: 5\' 6"  (1.676 m)     Body mass index is 26.28 kg/m.  Physical Exam:   Physical Exam  Constitutional: She appears well-developed and well-nourished. No distress.  HENT:  Head: Normocephalic and atraumatic.  Eyes: EOM are normal. Pupils are equal, round, and reactive to light.  Neck: Normal range of motion. Neck supple.  Cardiovascular: Normal rate, regular rhythm, normal heart sounds and intact distal pulses.   Pulmonary/Chest: Effort normal.  Abdominal: Soft.  Skin: Skin is warm.  Psychiatric: She has a normal mood and affect. Her behavior is normal.  Nursing note and vitals reviewed.  Assessment and Plan:   [name] was seen today for establish care.  Diagnoses and all orders for this visit:  Depression, recurrent ([place]) Comments: After discussion, patient would like to start below medication. Patient was given information on SSRIs and possible side effects were reviewed. She was asked to contact us with any worsening in symptoms or suicidal thoughts and we discussed that it would take 2-4 weeks to begin to see improvement in her symptoms.  Orders: -     escitalopram (LEXAPRO) 10 MG tablet; Take 1 tablet (10 mg total) by mouth daily.  Records requested if needed. Time spent with the patient: 30 minutes, of which >50% was spent in obtaining information about her symptoms, reviewing her previous labs, evaluations, and treatments, counseling her about her condition (please see the discussed topics above), and developing a plan to further investigate it; she had a number of questions which I addressed.   . Reviewed expectations re: course of current medical issues. . Discussed self-management of symptoms. . Outlined signs and symptoms indicating need for more acute intervention. . Patient verbalized understanding and all questions were  answered. Marland Kitchen Health Maintenance issues including appropriate healthy diet, exercise, and smoking avoidance were discussed with patient. . See orders for this visit as documented in the electronic medical record. . Patient received an After Visit Summary.  CMA served as Education administrator during this visit. History, Physical, and Plan performed by medical provider. The above documentation has been reviewed and is accurate and complete. Briscoe Deutscher, D.O.  Briscoe Deutscher, DO [place], [facility] [place] [facility] 02/16/2017  Future Appointments Date Time Provider [place]  03/27/2017 9:45 AM Briscoe Deutscher, DO [facility] None

## 2017-03-27 ENCOUNTER — Ambulatory Visit (INDEPENDENT_AMBULATORY_CARE_PROVIDER_SITE_OTHER): Payer: No Typology Code available for payment source | Admitting: Family Medicine

## 2017-03-27 ENCOUNTER — Encounter: Payer: Self-pay | Admitting: Family Medicine

## 2017-03-27 VITALS — BP 114/76 | HR 90 | Temp 97.7°F | Ht 66.0 in | Wt 164.2 lb

## 2017-03-27 DIAGNOSIS — E559 Vitamin D deficiency, unspecified: Secondary | ICD-10-CM

## 2017-03-27 DIAGNOSIS — G43109 Migraine with aura, not intractable, without status migrainosus: Secondary | ICD-10-CM | POA: Diagnosis not present

## 2017-03-27 DIAGNOSIS — F339 Major depressive disorder, recurrent, unspecified: Secondary | ICD-10-CM

## 2017-03-27 DIAGNOSIS — R7989 Other specified abnormal findings of blood chemistry: Secondary | ICD-10-CM

## 2017-03-27 LAB — VITAMIN D 25 HYDROXY (VIT D DEFICIENCY, FRACTURES): VITD: 54.87 ng/mL (ref 30.00–100.00)

## 2017-03-27 MED ORDER — ESCITALOPRAM OXALATE 10 MG PO TABS: 10.0000 mg | ORAL_TABLET | Freq: Every day | ORAL | 6 refills | Status: DC

## 2017-03-27 MED ORDER — BUTALBITAL-APAP-CAFFEINE 50-325-40 MG PO TABS: 1.0000 | ORAL_TABLET | Freq: Four times a day (QID) | ORAL | 0 refills | Status: DC | PRN

## 2017-03-27 NOTE — Progress Notes (Signed)
[name] [name] is a 36 y.o. female is here for follow up.  History of Present Ill   [name] acting as scribe for Dr. [name].  HPI: Patient comes in today for a one month follow up for her depression. She is needing a refill on her Lexepro today.   Headaches: She would also like to discuss her headaches. Patient has had migraines in the past. She has seen Neurology before for the headaches. Has tried Imitrex in the past with no help. The headache starts in the back of the neck and wrap to the temples. They are tight. She takes tylenol every morning for joint pain. Neurological ROS: negative for - confusion, dizziness, memory loss, numbness/tingling, seizures, speech problems, tremors, visual changes or weakness.  Health Maintenance Due  Topic Date Due  . PAP SMEAR  11/27/2011  . INFLUENZA VACCINE  03/28/2017   Depression screen [facility] 2/9 02/16/2017 12/07/2014  Decreased Interest 0 0  Down, Depressed, Hopeless 0 0  PHQ - 2 Score 0 0  Altered sleeping 0 -  Tired, decreased energy 0 -  Change in appetite 0 -  Feeling bad or failure about yourself  1 -  Trouble concentrating 0 -  Moving slowly or fidgety/restless 0 -  Suicidal thoughts 0 -  PHQ-9 Score 1 -   PMHx, SurgHx, SocialHx, FamHx, Medications, and Allergies were reviewed in the Visit Navigator and updated as appropriate.   Patient Active Problem List   Diagnosis Date Noted  . Migraine with aura and without status migrainosus, not intractable 09/06/2015  . Anxiety and depression 11/05/2009  . Attention deficit disorder 11/05/2009   Social History  Substance Use Topics  . Smoking status: Current Some Day Smoker  . Smokeless tobacco: Never Used     Comment: smokes socially (1 cigarette a week)  . Alcohol use No   Current Medications and Allergies:   Current Outpatient Prescriptions:  .  ALPRAZolam (XANAX) 0.5 MG tablet, Take 0.5 mg by mouth 4 (four) times daily as needed for anxiety. , Disp: , Rfl: 5 .   amphetamine-dextroamphetamine (ADDERALL) 20 MG tablet, Take 20 mg by mouth 3 (three) times daily., Disp: , Rfl:  .  escitalopram (LEXAPRO) 10 MG tablet, Take 1 tablet (10 mg total) by mouth daily., Disp: 30 tablet, Rfl: 1 .  MICROGESTIN FE 1/20 1-20 MG-MCG tablet, Take 1 tablet by mouth daily., Disp: , Rfl: 11  Allergies  Allergen Reactions  . Ibuprofen Hives and Other (See Comments)    stuffiness  . Food     Nuts and Yeast  . Latex Hives  . Lovenox [Enoxaparin Sodium] Other (See Comments)    Face flush and redness  . Other Hives    CATS  . Penicillins Hives  . Yellow Dyes (Non-Tartrazine) Hives    YELLOW No. 5   Review of Systems   Pertinent items are noted in the HPI. Otherwise, ROS is negative.  Vitals:   Vitals:   03/27/17 0954  BP: 114/76  Pulse: 90  Temp: 97.7 F (36.5 C)  TempSrc: Oral  SpO2: 97%  Weight: 164 lb 3.2 oz (74.5 kg)  Height: 5\' 6"  (1.676 m)     Body mass index is 26.5 kg/m.   Physical Exam:   Physical Exam  Constitutional: She appears well-nourished.  HENT:  Head: Normocephalic and atraumatic.  Eyes: Pupils are equal, round, and reactive to light. EOM are normal.  Neck: Normal range of motion. Neck supple.  Cardiovascular: Normal rate,  regular rhythm, normal heart sounds and intact distal pulses.   Pulmonary/Chest: Effort normal.  Abdominal: Soft.  Skin: Skin is warm.  Psychiatric: She has a normal mood and affect. Her behavior is normal.  Nursing note and vitals reviewed.   Assessment and Plan:   Veronnica was seen today for medication management and headache.  Diagnoses and all orders for this visit:  Migraine with aura and without status migrainosus, not intractable Comments: After discussion, patient would like to start below medication. Expectations, risks, and potential side effects reviewed. She was instructed to use the medication sparingly. Orders: -     butalbital-acetaminophen-caffeine (FIORICET, ESGIC) 50-325-40 MG tablet;  Take 1-2 tablets by mouth every 6 (six) hours as needed for headache.  Depression, recurrent ([place]) Comments: Refill today. Orders: -     escitalopram (LEXAPRO) 10 MG tablet; Take 1 tablet (10 mg total) by mouth daily.  Low vitamin D level -     VITAMIN D 25 Hydroxy (Vit-D Deficiency, Fractures)   . Reviewed expectations re: course of current medical issues. . Discussed self-management of symptoms. . Outlined signs and symptoms indicating need for more acute intervention. . Patient verbalized understanding and all questions were answered. Marland Kitchen Health Maintenance issues including appropriate healthy diet, exercise, and smoking avoidance were discussed with patient. . See orders for this visit as documented in the electronic medical record. . Patient received an After Visit Summary.  CMA served as Education administrator during this visit. History, Physical, and Plan performed by medical provider. The above documentation has been reviewed and is accurate and complete. Briscoe Deutscher, D.O.  Briscoe Deutscher, DO , [place] 03/31/2017  Future Appointments Date Time Provider [place]  09/25/2017 9:45 AM Briscoe Deutscher, DO [facility] None

## 2017-04-13 ENCOUNTER — Other Ambulatory Visit: Payer: Self-pay | Admitting: Family Medicine

## 2017-04-13 DIAGNOSIS — F339 Major depressive disorder, recurrent, unspecified: Secondary | ICD-10-CM

## 2017-04-13 NOTE — Telephone Encounter (Signed)
Called patients pharmacy and she has 5 refills left.

## 2017-05-30 ENCOUNTER — Other Ambulatory Visit: Payer: Self-pay

## 2017-05-30 ENCOUNTER — Encounter: Payer: Self-pay | Admitting: Family Medicine

## 2017-05-30 DIAGNOSIS — G43109 Migraine with aura, not intractable, without status migrainosus: Secondary | ICD-10-CM

## 2017-05-30 MED ORDER — BUTALBITAL-APAP-CAFFEINE 50-325-40 MG PO TABS: 1.0000 | ORAL_TABLET | Freq: Four times a day (QID) | ORAL | 0 refills | Status: DC | PRN

## 2017-06-10 ENCOUNTER — Encounter: Payer: Self-pay | Admitting: Family Medicine

## 2017-07-30 ENCOUNTER — Encounter: Payer: Self-pay | Admitting: Family Medicine

## 2017-08-07 ENCOUNTER — Ambulatory Visit: Payer: No Typology Code available for payment source | Admitting: Family Medicine

## 2017-08-15 ENCOUNTER — Encounter: Payer: Self-pay | Admitting: Family Medicine

## 2017-08-15 ENCOUNTER — Ambulatory Visit (INDEPENDENT_AMBULATORY_CARE_PROVIDER_SITE_OTHER): Payer: No Typology Code available for payment source

## 2017-08-15 ENCOUNTER — Ambulatory Visit: Payer: No Typology Code available for payment source | Admitting: Family Medicine

## 2017-08-15 VITALS — BP 120/68 | HR 105 | Temp 98.6°F | Wt 157.8 lb

## 2017-08-15 DIAGNOSIS — R202 Paresthesia of skin: Secondary | ICD-10-CM

## 2017-08-15 DIAGNOSIS — M79641 Pain in right hand: Secondary | ICD-10-CM

## 2017-08-15 DIAGNOSIS — F419 Anxiety disorder, unspecified: Secondary | ICD-10-CM

## 2017-08-15 DIAGNOSIS — M79642 Pain in left hand: Secondary | ICD-10-CM | POA: Diagnosis not present

## 2017-08-15 DIAGNOSIS — M542 Cervicalgia: Secondary | ICD-10-CM | POA: Diagnosis not present

## 2017-08-15 DIAGNOSIS — M79602 Pain in left arm: Secondary | ICD-10-CM

## 2017-08-15 DIAGNOSIS — G8929 Other chronic pain: Secondary | ICD-10-CM

## 2017-08-15 DIAGNOSIS — M79601 Pain in right arm: Secondary | ICD-10-CM

## 2017-08-15 DIAGNOSIS — G43109 Migraine with aura, not intractable, without status migrainosus: Secondary | ICD-10-CM | POA: Diagnosis not present

## 2017-08-15 DIAGNOSIS — F329 Major depressive disorder, single episode, unspecified: Secondary | ICD-10-CM

## 2017-08-15 DIAGNOSIS — F32A Depression, unspecified: Secondary | ICD-10-CM

## 2017-08-15 LAB — CBC WITH DIFFERENTIAL/PLATELET
Basophils Absolute: 0 10*3/uL (ref 0.0–0.1)
Basophils Relative: 0.2 % (ref 0.0–3.0)
Eosinophils Absolute: 0.1 10*3/uL (ref 0.0–0.7)
Eosinophils Relative: 1.8 % (ref 0.0–5.0)
HCT: 39.8 % (ref 36.0–46.0)
Hemoglobin: 13.4 g/dL (ref 12.0–15.0)
Lymphocytes Relative: 34.8 % (ref 12.0–46.0)
Lymphs Abs: 2.1 10*3/uL (ref 0.7–4.0)
MCHC: 33.6 g/dL (ref 30.0–36.0)
MCV: 90.3 fl (ref 78.0–100.0)
Monocytes Absolute: 0.3 10*3/uL (ref 0.1–1.0)
Monocytes Relative: 4.4 % (ref 3.0–12.0)
Neutro Abs: 3.5 10*3/uL (ref 1.4–7.7)
Neutrophils Relative %: 58.8 % (ref 43.0–77.0)
Platelets: 244 10*3/uL (ref 150.0–400.0)
RBC: 4.41 Mil/uL (ref 3.87–5.11)
RDW: 13.2 % (ref 11.5–15.5)
WBC: 6 10*3/uL (ref 4.0–10.5)

## 2017-08-15 LAB — COMPREHENSIVE METABOLIC PANEL
ALT: 11 U/L (ref 0–35)
AST: 13 U/L (ref 0–37)
Albumin: 4.1 g/dL (ref 3.5–5.2)
Alkaline Phosphatase: 44 U/L (ref 39–117)
BUN: 10 mg/dL (ref 6–23)
CO2: 30 mEq/L (ref 19–32)
Calcium: 9 mg/dL (ref 8.4–10.5)
Chloride: 103 mEq/L (ref 96–112)
Creatinine, Ser: 0.64 mg/dL (ref 0.40–1.20)
GFR: 111.58 mL/min (ref >60.00)
Glucose, Bld: 86 mg/dL (ref 70–99)
Potassium: 4.2 mEq/L (ref 3.5–5.1)
Sodium: 138 mEq/L (ref 135–145)
Total Bilirubin: 0.3 mg/dL (ref 0.2–1.2)
Total Protein: 7.1 g/dL (ref 6.0–8.3)

## 2017-08-15 LAB — VITAMIN B12: Vitamin B-12: 260 pg/mL (ref 211–911)

## 2017-08-15 LAB — TSH: TSH: 0.82 u[IU]/mL (ref 0.35–4.50)

## 2017-08-15 LAB — SEDIMENTATION RATE: Sed Rate: 7 mm/hr (ref 0–20)

## 2017-08-15 LAB — C-REACTIVE PROTEIN: CRP: 0.7 mg/dL (ref 0.5–20.0)

## 2017-08-15 MED ORDER — PREDNISONE 5 MG PO TABS: 5.0000 mg | ORAL_TABLET | Freq: Every day | ORAL | 0 refills | Status: DC

## 2017-08-15 MED ORDER — BUTALBITAL-APAP-CAFFEINE 50-325-40 MG PO TABS: 1.0000 | ORAL_TABLET | Freq: Four times a day (QID) | ORAL | 0 refills | Status: DC | PRN

## 2017-08-15 MED ORDER — DIAZEPAM 5 MG PO TABS: 5.0000 mg | ORAL_TABLET | Freq: Every evening | ORAL | 1 refills | Status: DC | PRN

## 2017-08-15 MED ORDER — ESCITALOPRAM OXALATE 20 MG PO TABS: 20.0000 mg | ORAL_TABLET | Freq: Every day | ORAL | 3 refills | Status: DC

## 2017-08-15 NOTE — Progress Notes (Signed)
[name] [name] is a 36 y.o. female is here for follow up.  History of Present Illness:   HPI: See Assessment and Plan section for Problem Based Charting of issues discussed today.  Health Maintenance Due  Topic Date Due  . PAP SMEAR  11/27/2011   Depression screen [facility] 2/9 02/16/2017 12/07/2014  Decreased Interest 0 0  Down, Depressed, Hopeless 0 0  PHQ - 2 Score 0 0  Altered sleeping 0 -  Tired, decreased energy 0 -  Change in appetite 0 -  Feeling bad or failure about yourself  1 -  Trouble concentrating 0 -  Moving slowly or fidgety/restless 0 -  Suicidal thoughts 0 -  PHQ-9 Score 1 -   PMHx, SurgHx, SocialHx, FamHx, Medications, and Allergies were reviewed in the Visit Navigator and updated as appropriate.   Patient Active Problem List   Diagnosis Date Noted  . Migraine with aura and without status migrainosus, not intractable 09/06/2015  . Anxiety and depression 11/05/2009  . Attention deficit disorder 11/05/2009   Social History   Tobacco Use  . Smoking status: Current Some Day Smoker  . Smokeless tobacco: Never Used  . Tobacco comment: smokes socially (1 cigarette a week)  Substance Use Topics  . Alcohol use: No    Alcohol/week: 0.0 oz  . Drug use: No   Current Medications and Allergies:   Current Outpatient Medications:  .  ALPRAZolam (XANAX) 0.5 MG tablet, Take 0.5 mg by mouth 4 (four) times daily as needed for anxiety. , Disp: , Rfl: 5 .  amphetamine-dextroamphetamine (ADDERALL) 20 MG tablet, Take 20 mg by mouth 3 (three) times daily., Disp: , Rfl:  .  butalbital-acetaminophen-caffeine (FIORICET, ESGIC) 50-325-40 MG tablet, Take 1-2 tablets by mouth every 6 (six) hours as needed for headache., Disp: 20 tablet, Rfl: 0 .  cetirizine (ZYRTEC) 10 MG tablet, Take 10 mg by mouth daily., Disp: , Rfl:  .  MICROGESTIN FE 1/20 1-20 MG-MCG tablet, Take 1 tablet by mouth daily., Disp: , Rfl: 11 .  ranitidine (ZANTAC) 150 MG tablet, Take 150 mg by mouth 2 (two) times  daily., Disp: , Rfl:  .  diazepam (VALIUM) 5 MG tablet, Take 1 tablet (5 mg total) by mouth at bedtime as needed for anxiety., Disp: 30 tablet, Rfl: 1 .  escitalopram (LEXAPRO) 20 MG tablet, Take 1 tablet (20 mg total) by mouth daily., Disp: 30 tablet, Rfl: 3  Allergies  Allergen Reactions  . Ibuprofen Hives and Other (See Comments)    stuffiness  . Food     Nuts and Yeast  . Latex Hives  . Lovenox [Enoxaparin Sodium] Other (See Comments)    Face flush and redness  . Other Hives    CATS  . Penicillins Hives  . Yellow Dyes (Non-Tartrazine) Hives    YELLOW No. 5   Review of Systems   Pertinent items are noted in the HPI. Otherwise, ROS is negative.  Vitals:   Vitals:   08/15/17 1014  BP: 120/68  Pulse: (!) 105  Temp: 98.6 F (37 C)  TempSrc: Oral  SpO2: 98%  Weight: 157 lb 12.8 oz (71.6 kg)     Body mass index is 25.47 kg/m.   Physical Exam:   Physical Exam  Constitutional: She appears well-nourished.  HENT:  Head: Normocephalic and atraumatic.  Eyes: EOM are normal. Pupils are equal, round, and reactive to light.  Neck: Normal range of motion. Neck supple.  Cardiovascular: Normal rate, regular rhythm, normal heart sounds  and intact distal pulses.  Pulmonary/Chest: Effort normal.  Abdominal: Soft.  Musculoskeletal:       Cervical back: She exhibits decreased range of motion and spasm.       Left hand: She exhibits bony tenderness and swelling.       Hands: Skin: Skin is warm.  Psychiatric: She has a normal mood and affect. Her behavior is normal.  Nursing note and vitals reviewed.   Results for orders placed or performed in visit on 03/27/17  VITAMIN D 25 Hydroxy (Vit-D Deficiency, Fractures)  Result Value Ref Range   VITD 54.87 30.00 - 100.00 ng/mL   Assessment and Plan:   Cadi was seen today for neck pain.  Diagnoses and all orders for this visit:  Chronic neck pain -     DG Cervical Spine 2 or 3 views; Future -     diazepam (VALIUM) 5 MG tablet;  Take 1 tablet (5 mg total) by mouth at bedtime as needed for anxiety.  Migraine with aura and without status migrainosus, not intractable Comments: No signs of complications, medication side effects, or red flags.  Continue current regimen.   Orders: -     butalbital-acetaminophen-caffeine (FIORICET, ESGIC) 50-325-40 MG tablet; Take 1-2 tablets by mouth every 6 (six) hours as needed for headache. -     Comprehensive metabolic panel -     CBC with Differential/Platelet  Bilateral hand pain -     DG Hand 2 View Left; Future -     DG Hand 2 View Right; Future -     TSH -     Rheumatoid Factor -     predniSONE (DELTASONE) 5 MG tablet; Take 1 tablet (5 mg total) by mouth daily with breakfast. 6-5-4-3-2-1-off -     Sedimentation rate -     C-reactive protein  Paresthesia and pain of both upper extremities -     B12  Anxiety and depression Comments: Her grandmother died. [place]. See below treatment. No red flags.  Orders: -     escitalopram (LEXAPRO) 20 MG tablet; Take 1 tablet (20 mg total) by mouth daily.   . Reviewed expectations re: course of current medical issues. . Discussed self-management of symptoms. . Outlined signs and symptoms indicating need for more acute intervention. . Patient verbalized understanding and all questions were answered. Marland Kitchen Health Maintenance issues including appropriate healthy diet, exercise, and smoking avoidance were discussed with patient. . See orders for this visit as documented in the electronic medical record. . Patient received an After Visit Summary.  Briscoe Deutscher, DO [place], [facility] [place] 08/15/2017  Future Appointments  Date Time Provider [place]  09/25/2017  9:40 AM Briscoe Deutscher, DO [facility]

## 2017-08-16 LAB — RHEUMATOID FACTOR: Rhuematoid fact SerPl-aCnc: <14 IU/mL (ref <14)

## 2017-08-22 ENCOUNTER — Encounter: Payer: Self-pay | Admitting: Surgical

## 2017-08-22 ENCOUNTER — Encounter: Payer: Self-pay | Admitting: Family Medicine

## 2017-08-23 ENCOUNTER — Other Ambulatory Visit: Payer: Self-pay | Admitting: Surgical

## 2017-08-23 DIAGNOSIS — M542 Cervicalgia: Secondary | ICD-10-CM

## 2017-08-31 ENCOUNTER — Ambulatory Visit: Payer: No Typology Code available for payment source | Admitting: Sports Medicine

## 2017-09-04 ENCOUNTER — Ambulatory Visit (INDEPENDENT_AMBULATORY_CARE_PROVIDER_SITE_OTHER): Payer: No Typology Code available for payment source | Admitting: Sports Medicine

## 2017-09-04 ENCOUNTER — Encounter: Payer: Self-pay | Admitting: Sports Medicine

## 2017-09-04 ENCOUNTER — Ambulatory Visit: Payer: Self-pay

## 2017-09-04 VITALS — BP 118/78 | HR 87 | Ht 66.0 in | Wt 160.6 lb

## 2017-09-04 DIAGNOSIS — M25872 Other specified joint disorders, left ankle and foot: Secondary | ICD-10-CM | POA: Diagnosis not present

## 2017-09-04 DIAGNOSIS — M7918 Myalgia, other site: Secondary | ICD-10-CM

## 2017-09-04 DIAGNOSIS — M542 Cervicalgia: Secondary | ICD-10-CM

## 2017-09-04 DIAGNOSIS — G5603 Carpal tunnel syndrome, bilateral upper limbs: Secondary | ICD-10-CM | POA: Diagnosis not present

## 2017-09-04 DIAGNOSIS — F329 Major depressive disorder, single episode, unspecified: Secondary | ICD-10-CM | POA: Diagnosis not present

## 2017-09-04 DIAGNOSIS — F419 Anxiety disorder, unspecified: Secondary | ICD-10-CM | POA: Diagnosis not present

## 2017-09-04 DIAGNOSIS — F32A Depression, unspecified: Secondary | ICD-10-CM

## 2017-09-04 MED ORDER — PREGABALIN 75 MG PO CAPS: 75.0000 mg | ORAL_CAPSULE | Freq: Two times a day (BID) | ORAL | 1 refills | Status: DC

## 2017-09-04 NOTE — Progress Notes (Signed)
[name] [name]. [name] [name], [place] at [place]  [name] [name] - 37 y.o. female MRN [id number]  Date of birth: Oct 20, 1980  Visit Date: 09/04/2017  PCP: Briscoe Deutscher, DO   Referred by: Briscoe Deutscher, DO   Scribe for today's visit: [name], LAT, ATC     SUBJECTIVE:  [name] [name] "[name] [name]" is here for New Patient (Initial Visit) (Chronic neck pain) .  Referred by: Dr. [name] Her neck pain (R>L) and B hand tingling w/ pain and swelling in B thumb web spaces symptoms INITIALLY: Began approximately 4-6 months ago with no MOI. Described as 6/10 uncomfortable, throbbing pain , radiating to B hands. Worsened with trying to pick up her kids and w/ gripping. Improved with steroid dose pack which she has now finished.  Heat and topical rub Additional associated symptoms include: N/T in B hands and radiating pain from neck to B shoulders.    At this time symptoms show no change compared to onset  She has been using Valium to help her sleep and completed a steroid dose pack.  Uses heat and topical rub.  Also has L ganglion cyst in her ankle - Dr. Rip Harbour at [facility].  Feels like this is causing her gait to be off which is now irritating her L SIJ.  Ankle/foot pain began about 2 years ago   ROS Denies night time disturbances. Denies fevers, chills, or night sweats. Denies unexplained weight loss. Denies personal history of cancer. Denies changes in bowel or bladder habits. Denies recent unreported falls. Denies new or worsening dyspnea or wheezing. Reports headaches or dizziness. Yes to headaches and migraines. Reports numbness, tingling or weakness  In the B hands. Denies dizziness or presyncopal episodes Denies lower extremity edema     HISTORY & PERTINENT PRIOR DATA:  Prior History reviewed and updated per electronic medical record.  Significant history, findings, studies and interim changes include:  reports that  she has been smoking.  she has never used smokeless tobacco. No results for input(s): HGBA1C, LABURIC, CREATINE in the last 8760 hours. Unable to take ibuprofen and yellow dye due to hives.  Sed rate and CRP obtained on 08/15/2017 are remarkably normal with a normal B12, TSH of 0.82 sed rate of 7  03/27/2017 - vitamin D 54 Problem  Myofascial Pain  Bilateral Carpal Tunnel Syndrome  Ankle Impingement Syndrome, Left   Strong family history of foot issues     OBJECTIVE:  VS:  HT:5\' 6"  (167.6 cm)   WT:160 lb 9.6 oz (72.8 kg)  BMI:25.93    BP:118/78  HR:87bpm  TEMP: ( )  RESP:99 %   PHYSICAL EXAM: Constitutional: WDWN, Non-toxic appearing. Psychiatric: Alert & appropriately interactive.  Not depressed or anxious appearing. Respiratory: No increased work of breathing.  Trachea Midline Eyes: Pupils are equal.  EOM intact without nystagmus.  No scleral icterus  NEUROVASCULAR exam: No clubbing or cyanosis appreciated No significant venous stasis changes Capillary Refill: normal, less than 2 seconds    UPPER Extremities LOWER Extremities  SWELLING Generalized UE Edema: No significant edema Pre-tibial edema: No significant pretibial edema  PULSES Radial Pulses: Normal & symmetrically palpable Pedal Pulses: Normal & symmetrically palpable  SENSORY Dermatomes intact to light touch Dermatomes intact to light touch  MOTOR Normal strength in all myotomes Normal strength in all myotomes  REFLEXES Reflexes: Normal & symmetric DTRs Reflexes: Normal & symmetric DTRs   Bilateral upper extremities overall well aligned.  She has  some pain with Tinel's and carpal tunnel compression test.  Grip strength is intact although slightly diminished on the right compared to the left. Pain with palpation of bilateral trapezius muscles and slight, no radicular symptoms with brachial plexus squeeze.  No pain with arm squeeze test.  Tightness with straight leg raise bilaterally.  Left ankle is well aligned but  she does have a small amount of anterior lateral swelling that is slightly ballotable with only minimal pain.  Ankle drawer testing is stable although she does have a small amount of laxity with a stable endpoint with talar tilt and Klier testing. No additional findings.   ASSESSMENT & PLAN:   1. Neck pain   2. Myofascial pain   3. Bilateral carpal tunnel syndrome   4. Ankle impingement syndrome, left   5. Anxiety and depression    PLAN:    Myofascial pain Diffuse myofascial pain with associated anxiety component.  Home therapeutic exercises with focus on posterior chain treatment emphasized per AVS and per procedure note.  We will plan to have her start on Lyrica for this as well and check on her progress in 4 weeks.  Can consider Cymbalta but she is doing well on the Lexapro and I would prefer not to change this.  Consider osteopathic manipulation versus referral for chiropractic care and follow-up.  Bilateral carpal tunnel syndrome Mild carpal tunnel based on MSK ultrasound findings.  Nighttime splints and carpal tunnel stretching emphasized.  If any lack of improvement can consider injection.  Could consider topical Pennsaid given ibuprofen allergy will hold off at this time  Ankle impingement syndrome, left Recommend compression on a regular basis as well as longitudinal arch support in her shoes with over-the-counter options.  If any lack of improvement can consider injection.   ++++++++++++++++++++++++++++++++++++++++++++ Orders & Meds: Orders Placed This Encounter  Procedures  . Misc procedure  . [place] LIMITED JOINT SPACE STRUCTURES UP BILAT(NO LINKED CHARGES)    Meds ordered this encounter  Medications  . DISCONTD: pregabalin (LYRICA) 75 MG capsule    Sig: Take 1 capsule (75 mg total) by mouth 2 (two) times daily.    Dispense:  60 capsule    Refill:  1    ++++++++++++++++++++++++++++++++++++++++++++ Follow-up: Return in about 4 weeks (around 10/02/2017).   Pertinent  documentation may be included in additional procedure notes, imaging studies, problem based documentation and patient instructions. Please see these sections of the encounter for additional information regarding this visit. CMA/ATC served as Education administrator during this visit. History, Physical, and Plan performed by medical provider. Documentation and orders reviewed and attested to.      Gerda Diss, [facility] Physician

## 2017-09-04 NOTE — Assessment & Plan Note (Signed)
Diffuse myofascial pain with associated anxiety component.  Home therapeutic exercises with focus on posterior chain treatment emphasized per AVS and per procedure note.  We will plan to have her start on Lyrica for this as well and check on her progress in 4 weeks.  Can consider Cymbalta but she is doing well on the Lexapro and I would prefer not to change this.  Consider osteopathic manipulation versus referral for chiropractic care and follow-up.

## 2017-09-04 NOTE — Assessment & Plan Note (Signed)
Mild carpal tunnel based on MSK ultrasound findings.  Nighttime splints and carpal tunnel stretching emphasized.  If any lack of improvement can consider injection.  Could consider topical Pennsaid given ibuprofen allergy will hold off at this time

## 2017-09-04 NOTE — Assessment & Plan Note (Signed)
Recommend compression on a regular basis as well as longitudinal arch support in her shoes with over-the-counter options.  If any lack of improvement can consider injection.

## 2017-09-04 NOTE — Patient Instructions (Addendum)
Also check out [employer]" which is a program developed by Dr. Minerva Ends.   There are links to a couple of his YouTube Videos below and I would like to see performing one of his videos 5-6 days per week.    A good intro video is: "Independence from Pain 7-minute Video" - [email or web address]   His more advanced video is: "Powerful Posture and Pain Relief: 12 minutes of Foundation Training" - https://youtu.be/[email or web address]  Do not try to attempt this entire video when first beginning.    Try breaking of each exercise that he goes into shorter segments.  Otherwise if they perform an exercise for 45 seconds, start with 15 seconds and rest and then resume when they begin the new activity.    If you work your way up to doing this 12 minute video, I expect you will see significant improvements in your pain.  If you enjoy his videos and would like to find out more you can look on his website: [email or web address]  He has a workout streaming option as well as a DVD set available for purchase.  Amazon has the best price for his DVDs.     Please perform the exercise program that we have prepared for you and gone over in detail on a daily basis.  In addition to the handout you were provided you can access your program through: [email or web address]   Your unique program code is: [email or web address]

## 2017-09-10 ENCOUNTER — Telehealth: Payer: Self-pay | Admitting: Family Medicine

## 2017-09-10 DIAGNOSIS — M7918 Myalgia, other site: Secondary | ICD-10-CM

## 2017-09-10 DIAGNOSIS — G5603 Carpal tunnel syndrome, bilateral upper limbs: Secondary | ICD-10-CM

## 2017-09-10 DIAGNOSIS — M542 Cervicalgia: Secondary | ICD-10-CM

## 2017-09-10 NOTE — Telephone Encounter (Signed)
Med needs prior authorization

## 2017-09-10 NOTE — Telephone Encounter (Signed)
Copied from [place] [phone]. Topic: Quick Communication - Rx Refill/Question >> Sep 10, 2017 10:02 AM Robina Ade, Helene Kelp D wrote: Medication: pregabalin (LYRICA) 75 MG capsule Has the patient contacted their pharmacy? Yes (Agent: If no, request that the patient contact the pharmacy for the refill.) Preferred Pharmacy (with phone number or street name): [id number] - [place], [place] - [address] Agent: Please be advised that RX refills may take up to 3 business days. We ask that you follow-up with your pharmacy. Medication needs prior authorization and patient was calling to see if it has been done and sent so she can get her medication.

## 2017-09-11 NOTE — Telephone Encounter (Signed)
Per allergy list, yellow # 5 dye causes hives. Will initiate PA.

## 2017-09-11 NOTE — Telephone Encounter (Signed)
[employer] is reviewing your PA request. Typically an electronic response will be received within 72 hours. To check for an update later, open this request from your dashboard.  Spoke with patient and she is aware that PA is pending and she will be contacted once we hear back from her insurance company.

## 2017-09-11 NOTE — Telephone Encounter (Signed)
Do not see Gabapentin on allergy list. Called pt and left VM to call the office.

## 2017-09-11 NOTE — Telephone Encounter (Signed)
Patient needs a prior authorization for the Lyrica due to pharmacy refusing to refill. She is allergic to the Gabapentin.   Please contact patient by the ned of the day to advise.

## 2017-09-11 NOTE — Telephone Encounter (Signed)
Gabapentin ingredients consist of yellow # 5 dye, patient would like to discuss further,  Please advise best # [phone]

## 2017-09-13 MED ORDER — PREGABALIN 75 MG PO CAPS: 75.0000 mg | ORAL_CAPSULE | Freq: Two times a day (BID) | ORAL | 1 refills | Status: DC

## 2017-09-13 NOTE — Telephone Encounter (Signed)
Okay to trial low dose amitriptyline in place of Lyrica. If any intolerance to this should qualify for Lyrica.  I have sent in a new Rx to her pharmacy  ==== Attempted to send in prescription and warning for yellow dye allergy for both amitriptyline and nortriptyline. both have yellow dye #5 in them as well.  This should qualify her for failing 3 including gabapentin, amitriptyline and duloxetine.

## 2017-09-13 NOTE — Telephone Encounter (Signed)
Please call patient back.  Copied from [place] [phone]. Topic: Inquiry >> Sep 13, 2017  2:33 PM Pricilla Handler wrote: Reason for CRM: Patient missed Brandy's call about the Prior Authorization. Patient wants Theadora Rama to call her back at [phone].       Thank You!!! .

## 2017-09-13 NOTE — Telephone Encounter (Signed)
Coverage of Lyrica has been denied. Pt must try and fail or show intolerance to 3 of of the following: Gabapentin Duloxetine Savella Venlafaxine One tricyclic anidepressant Pt has tried 2/3; is allergic to Gabapentin and had bad rxn to Duloxetine.

## 2017-09-13 NOTE — Telephone Encounter (Signed)
PA resubmitted, if denied will work on appeal. Called pt and left VM to call the office.

## 2017-09-14 ENCOUNTER — Encounter: Payer: Self-pay | Admitting: Sports Medicine

## 2017-09-14 NOTE — Telephone Encounter (Signed)
Called pt and advised that Lyrica was denied but another PA was submitted. I advised I would be in touch with her once I heard back regarding the PA.

## 2017-09-14 NOTE — Procedures (Signed)
PROCEDURE NOTE: THERAPEUTIC EXERCISES (97110) 15 minutes spent for Therapeutic exercises as below and as referenced in the AVS. This included exercises focusing on stretching, strengthening, with significant focus on eccentric aspects.  Proper technique shown and discussed handout in great detail with ATC. All questions were discussed and answered.   Long term goals include an improvement in range of motion, strength, endurance as well as avoiding reinjury. Frequency of visits is one time as determined during today's  office visit. Frequency of exercises to be performed is as per handout.  EXERCISES REVIEWED:  Carpal tunnel stretching testing  Archie Balboa exercises

## 2017-09-14 NOTE — Procedures (Signed)
LIMITED MSK ULTRASOUND OF bilateral median nerves Images were obtained and interpreted by myself, Teresa Coombs, DO  Images have been saved and stored to PACS system. Images obtained on: GE S7 Ultrasound machine  FINDINGS:   Right median nerve at the carpal tunnel measures 0.11 cm with a small amount of surrounding fluid but this is minimal  Left median nerve at the carpal tunnel measures 0.11 cm greater with minimal surrounding edema.  IMPRESSION:  1. Mild to moderate carpal tunnel syndrome bilaterally

## 2017-09-16 ENCOUNTER — Encounter: Payer: Self-pay | Admitting: Family Medicine

## 2017-09-16 ENCOUNTER — Other Ambulatory Visit: Payer: Self-pay | Admitting: Family Medicine

## 2017-09-16 DIAGNOSIS — G43109 Migraine with aura, not intractable, without status migrainosus: Secondary | ICD-10-CM

## 2017-09-17 ENCOUNTER — Other Ambulatory Visit: Payer: Self-pay

## 2017-09-17 DIAGNOSIS — G43109 Migraine with aura, not intractable, without status migrainosus: Secondary | ICD-10-CM

## 2017-09-17 MED ORDER — BUTALBITAL-APAP-CAFFEINE 50-325-40 MG PO TABS: 1.0000 | ORAL_TABLET | Freq: Four times a day (QID) | ORAL | 0 refills | Status: DC | PRN

## 2017-09-17 NOTE — Telephone Encounter (Signed)
Rx PA has been approved valid until 09/13/2018 or until the coverage for medication is no longer available under the benefit plan.

## 2017-09-17 NOTE — Telephone Encounter (Signed)
Called and left VM for pt advising rx had been approved and to call back with and questions or concerns.

## 2017-09-17 NOTE — Telephone Encounter (Signed)
Sent to pharmacy 

## 2017-09-18 ENCOUNTER — Other Ambulatory Visit: Payer: Self-pay | Admitting: Family Medicine

## 2017-09-18 DIAGNOSIS — G43109 Migraine with aura, not intractable, without status migrainosus: Secondary | ICD-10-CM

## 2017-09-25 ENCOUNTER — Ambulatory Visit: Payer: No Typology Code available for payment source | Admitting: Family Medicine

## 2017-09-26 ENCOUNTER — Encounter: Payer: Self-pay | Admitting: Sports Medicine

## 2017-09-26 ENCOUNTER — Ambulatory Visit: Payer: No Typology Code available for payment source | Admitting: Sports Medicine

## 2017-09-26 VITALS — BP 110/80 | HR 75 | Ht 66.0 in | Wt 157.6 lb

## 2017-09-26 DIAGNOSIS — M25872 Other specified joint disorders, left ankle and foot: Secondary | ICD-10-CM | POA: Diagnosis not present

## 2017-09-26 DIAGNOSIS — M76822 Posterior tibial tendinitis, left leg: Secondary | ICD-10-CM | POA: Diagnosis not present

## 2017-09-26 DIAGNOSIS — M79641 Pain in right hand: Secondary | ICD-10-CM

## 2017-09-26 DIAGNOSIS — M542 Cervicalgia: Secondary | ICD-10-CM | POA: Diagnosis not present

## 2017-09-26 DIAGNOSIS — G5603 Carpal tunnel syndrome, bilateral upper limbs: Secondary | ICD-10-CM

## 2017-09-26 DIAGNOSIS — M79642 Pain in left hand: Secondary | ICD-10-CM | POA: Diagnosis not present

## 2017-09-26 DIAGNOSIS — M7918 Myalgia, other site: Secondary | ICD-10-CM

## 2017-09-26 MED ORDER — NORTRIPTYLINE HCL 10 MG PO CAPS: 10.0000 mg | ORAL_CAPSULE | Freq: Every day | ORAL | 1 refills | Status: DC

## 2017-09-26 NOTE — Assessment & Plan Note (Signed)
Multifactorial pain consistent with myofascial pain versus fibromyalgia.  Once again encouraged frequent light impact aerobic exercise and therapeutic exercises outlined per AVS. She has an allergy to yellow number which is in many of the TCAs but it does appear that nortriptyline has yellow #10 and she believes she can tolerate this.  No prior history of anaphylaxis.

## 2017-09-26 NOTE — Patient Instructions (Addendum)
I recommend that you obtain over-the-counter SOLE  medium cushioned insoles.  These can be found at [employer] - or on-line at [employer]  Search for "SOLE Active Medium Shoe Insoles"  Remember to do the exercises we discussed last visit as well as well as walking on your tiptoes with your toes turned in +  heel raises with your toes turned in.

## 2017-09-26 NOTE — Assessment & Plan Note (Addendum)
Symptoms are reported as improving and prior MSK ultrasound did suggest mild CTS.  Can consider other diagnostic studies if any lack of improvement or persistent ongoing symptoms but will defer at this time.  Continue with night splints

## 2017-09-26 NOTE — Assessment & Plan Note (Signed)
Persistent ankle impingement symptoms.  Body Helix compression sleeve provided today.  Posterior tibialis recruitment exercises reviewed as well as well as longitudinal arch support with Strutz and SOL E orthotics.

## 2017-09-26 NOTE — Progress Notes (Signed)
[name] [name], [place] [facility] [place]  [name] - 37 y.o. female MRN [id number]  Date of birth: [birth year]  Visit Date: 09/26/2017  PCP: Briscoe Deutscher, DO   Referred by: Briscoe Deutscher, DO   Scribe for today's visit: Josepha Pigg, CMA     SUBJECTIVE:  [name] [name] "[name] [name]" is here for Follow-up (carpal tunnel, LT ankle impingement. )  Compared to the last office visit, her previously described symptoms show no change. Numbness in hands isn't constant anymore, just has occasional flare-up.  Current symptoms are moderate & are nonradiating She has been wearing braces at night. She has not been able to start Lyrica d/t cost. She has been working on home exercises.   Pt continues to have knots in her neck. She is also experiencing lower back pain  She has been wrapping her LT ankle but she has not been using longitudinal arch support.    ROS Reports night time disturbances. Denies fevers, chills, or night sweats. Denies unexplained weight loss. Denies personal history of cancer. Denies changes in bowel or bladder habits. Denies recent unreported falls. Denies new or worsening dyspnea or wheezing. Reports headaches or dizziness.  Reports numbness, tingling or weakness  In the extremities.  Denies dizziness or presyncopal episodes Denies lower extremity edema     HISTORY & PERTINENT PRIOR DATA:  Prior History reviewed and updated per electronic medical record.  Significant history, findings, studies and interim changes include:  reports that she has been smoking.  she has never used smokeless tobacco. No results for input(s): HGBA1C, LABURIC, CREATINE in the last 8760 hours. Unable to take ibuprofen and yellow dye due to hives.  Sed rate and CRP obtained on 08/15/2017 are remarkably normal with a normal B12, TSH of 0.82 sed rate of 7  03/27/2017 - vitamin D 54 Problem  Myofascial Pain  Bilateral  Carpal Tunnel Syndrome  Ankle Impingement Syndrome, Left   Strong family history of foot issues     OBJECTIVE:  VS:  HT:5\' 6"  (167.6 cm)   WT:157 lb 9.6 oz (71.5 kg)  BMI:25.45    BP:110/80  HR:75bpm  TEMP: ( )  RESP:96 %   PHYSICAL EXAM: Constitutional: WDWN, Non-toxic appearing. Psychiatric: Alert & appropriately interactive.  Not depressed or anxious appearing. Respiratory: No increased work of breathing.  Trachea Midline Eyes: Pupils are equal.  EOM intact without nystagmus.  No scleral icterus  NEUROVASCULAR exam: No clubbing or cyanosis appreciated No significant venous stasis changes Capillary Refill: normal, less than 2 seconds   Left lower extremity overall well.  She has a small directly over the syndesmosis.  Most focal pain over his over the posterior medial ankle over the posterior tibialis tendon and at the insertion of the posterior tibialis tendon on the navicular.  No focal bony pain over the N spot. 4 with posterior tibialis recruitment with heel raises. Bilateral hands overall well aligned without significant thenar atrophy.  Grip strength is improved.  Less pain with Phalen's and carpal tunnel compression test.  No additional findings.   ASSESSMENT & PLAN:   1. Neck pain   2. Myofascial pain   3. Bilateral carpal tunnel syndrome   4. Ankle impingement syndrome, left   5. 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 Bilateral hand pain    PLAN:    Myofascial pain Multifactorial pain consistent with myofascial pain versus fibromyalgia.  Once again encouraged frequent light  impact aerobic exercise and therapeutic exercises outlined per AVS. She has an allergy to yellow number which is in many of the TCAs but it does appear that nortriptyline has yellow #10 and she believes she can tolerate this.  No prior history of anaphylaxis.  Bilateral carpal tunnel syndrome Symptoms are reported as improving and prior MSK ultrasound did suggest mild  CTS.  Can consider other diagnostic studies if any lack of improvement or persistent ongoing symptoms but will defer at this time.  Continue with night splints  Ankle impingement syndrome, left Persistent ankle impingement symptoms.  Body Helix compression sleeve provided today.  Posterior tibialis recruitment exercises reviewed as well as well as longitudinal arch support with Strutz and SOL E orthotics.   ++++++++++++++++++++++++++++++++++++++++++++ Orders & Meds: No orders of the defined types were placed in this encounter.   Meds ordered this encounter  Medications  . nortriptyline (PAMELOR) 10 MG capsule    Sig: Take 1 capsule (10 mg total) by mouth at bedtime.    Dispense:  30 capsule    Refill:  1    Allergy to yellow #5 - no known problem with yellow -#10    ++++++++++++++++++++++++++++++++++++++++++++ Follow-up: Return in about 6 weeks (around 11/07/2017).   Pertinent documentation may be included in additional procedure notes, imaging studies, problem based documentation and patient instructions. Please see these sections of the encounter for additional information regarding this visit. CMA/ATC served as Education administrator during this visit. History, Physical, and Plan performed by medical provider. Documentation and orders reviewed and attested to.      Gerda Diss, [facility] Physician

## 2017-10-02 ENCOUNTER — Encounter: Payer: Self-pay | Admitting: Family Medicine

## 2017-10-02 ENCOUNTER — Ambulatory Visit: Payer: No Typology Code available for payment source | Admitting: Family Medicine

## 2017-10-02 VITALS — BP 118/70 | HR 68 | Temp 97.5°F | Ht 66.0 in | Wt 157.2 lb

## 2017-10-02 DIAGNOSIS — F32A Depression, unspecified: Secondary | ICD-10-CM

## 2017-10-02 DIAGNOSIS — M7918 Myalgia, other site: Secondary | ICD-10-CM

## 2017-10-02 DIAGNOSIS — F419 Anxiety disorder, unspecified: Secondary | ICD-10-CM

## 2017-10-02 DIAGNOSIS — F329 Major depressive disorder, single episode, unspecified: Secondary | ICD-10-CM

## 2017-10-02 DIAGNOSIS — Z7189 Other specified counseling: Secondary | ICD-10-CM

## 2017-10-02 DIAGNOSIS — Z7184 Encounter for health counseling related to travel: Secondary | ICD-10-CM

## 2017-10-02 MED ORDER — ONDANSETRON HCL 4 MG PO TABS: 4.0000 mg | ORAL_TABLET | Freq: Three times a day (TID) | ORAL | 0 refills | Status: DC | PRN

## 2017-10-02 MED ORDER — DULOXETINE HCL 20 MG PO CPEP: 20.0000 mg | ORAL_CAPSULE | Freq: Every day | ORAL | 3 refills | Status: DC

## 2017-10-02 MED ORDER — PREDNISONE 5 MG PO TABS: ORAL_TABLET | ORAL | 0 refills | Status: DC

## 2017-10-02 NOTE — Patient Instructions (Signed)
Try Tumeric 1000 mg daily as your anti-inflammatory.

## 2017-10-02 NOTE — Progress Notes (Signed)
[name] [name] is a 37 y.o. female is here for follow up.  History of Present Illness:   HPI: See Assessment and Plan section for Problem Based Charting of issues discussed today.  There are no preventive care reminders to display for this patient.   Depression screen [facility] 2/9 10/02/2017 02/16/2017 12/07/2014  Decreased Interest 0 0 0  Down, Depressed, Hopeless 0 0 0  PHQ - 2 Score 0 0 0  Altered sleeping 0 0 -  Tired, decreased energy 0 0 -  Change in appetite 0 0 -  Feeling bad or failure about yourself  0 1 -  Trouble concentrating 0 0 -  Moving slowly or fidgety/restless 0 0 -  Suicidal thoughts 0 0 -  PHQ-9 Score 0 1 -  Difficult doing work/chores Not difficult at all - -   PMHx, SurgHx, SocialHx, FamHx, Medications, and Allergies were reviewed in the Visit Navigator and updated as appropriate.   Patient Active Problem List   Diagnosis Date Noted  . Myofascial pain 09/04/2017  . Bilateral carpal tunnel syndrome 09/04/2017  . Ankle impingement syndrome, left 09/04/2017  . Migraine with aura and without status migrainosus, not intractable 09/06/2015  . Anxiety and depression 11/05/2009  . Attention deficit disorder 11/05/2009   Social History   Tobacco Use  . Smoking status: Current Some Day Smoker  . Smokeless tobacco: Never Used  . Tobacco comment: smokes socially (1 cigarette a week)  Substance Use Topics  . Alcohol use: No    Alcohol/week: 0.0 oz  . Drug use: No   Current Medications and Allergies:   .  ALPRAZolam (XANAX) 0.5 MG tablet, Take 0.5 mg by mouth 4 (four) times daily as needed for anxiety. , Disp: , Rfl: 5 .  amphetamine-dextroamphetamine (ADDERALL) 20 MG tablet, Take 20 mg by mouth 3 (three) times daily., Disp: , Rfl:  .  butalbital-acetaminophen-caffeine (FIORICET, ESGIC) 50-325-40 MG tablet, Take 1-2 tablets by mouth every 6 (six) hours as needed for headache., Disp: 20 tablet, Rfl: 0 .  cetirizine (ZYRTEC) 10 MG tablet, Take 10 mg by mouth daily.,  Disp: , Rfl:  .  diazepam (VALIUM) 5 MG tablet, Take 1 tablet (5 mg total) by mouth at bedtime as needed for anxiety., Disp: 30 tablet, Rfl: 1 .  escitalopram (LEXAPRO) 20 MG tablet, Take 1 tablet (20 mg total) by mouth daily., Disp: 30 tablet, Rfl: 3 .  MICROGESTIN FE 1/20 1-20 MG-MCG tablet, Take 1 tablet by mouth daily., Disp: , Rfl: 11 .  nortriptyline (PAMELOR) 10 MG capsule, Take 1 capsule (10 mg total) by mouth at bedtime., Disp: 30 capsule, Rfl: 1 .  ranitidine (ZANTAC) 150 MG tablet, Take 150 mg by mouth 2 (two) times daily., Disp: , Rfl:    Allergies  Allergen Reactions  . Ibuprofen Hives and Other (See Comments)    stuffiness  . Food     Nuts and Yeast  . Latex Hives  . Lovenox [Enoxaparin Sodium] Other (See Comments)    Face flush and redness  . Other Hives    CATS  . Penicillins Hives  . Yellow Dyes (Non-Tartrazine) Hives    YELLOW No. 5   Review of Systems   Pertinent items are noted in the HPI. Otherwise, ROS is negative.  Vitals:   Vitals:   10/02/17 0936  BP: 118/70  Pulse: 68  Temp: (!) 97.5 F (36.4 C)  TempSrc: Oral  SpO2: 99%  Weight: 157 lb 3.2 oz (71.3 kg)  Height:  5\' 6"  (1.676 m)     Body mass index is 25.37 kg/m.   Physical Exam:   Physical Exam  Constitutional: She appears well-nourished.  HENT:  Head: Normocephalic and atraumatic.  Eyes: EOM are normal. Pupils are equal, round, and reactive to light.  Neck: Normal range of motion. Neck supple.  Cardiovascular: Normal rate, regular rhythm, normal heart sounds and intact distal pulses.  Pulmonary/Chest: Effort normal.  Abdominal: Soft.  Skin: Skin is warm.  Psychiatric: She has a normal mood and affect. Her behavior is normal.  Nursing note and vitals reviewed.   Assessment and Plan:   Shaune Pascal, CMA, scribing for Dr. [name].  Patient comes in today for her six month follow up. She stated that she could not afford the Lyrica. When she saw Dr. Paulla Fore he prescribed the  nortriptyline. She did not start taking yet due to being concerned about the side effects. She is on Lexapro 20 mg and she is wondering if this to high of a dose for her. She is having some dizzy spells in the afternoon.   Kirk was seen today for medication management.  Diagnoses and all orders for this visit:  Myofascial pain Comments: I encouraged her try Tumeric as an anti-inflammatory. She may trial Pamelor as recommended by Dr. Paulla Fore. She has an upcoming trip. Okay treatment as below.  Orders: -     predniSONE (DELTASONE) 5 MG tablet; Please take 6,6,5,5,4,4,3,3,2,2,1,1 and done -     DULoxetine (CYMBALTA) 20 MG capsule; Take 1 capsule (20 mg total) by mouth daily. -     cyclobenzaprine (FLEXERIL) 5 MG tablet; Take 1 tablet (5 mg total) by mouth 3 (three) times daily as needed for muscle spasms.  Travel advice encounter Comments: Patient is going on a cruise. Okay Rx Zofran for her trip. Orders: -     ondansetron (ZOFRAN) 4 MG tablet; Take 1 tablet (4 mg total) by mouth every 8 (eight) hours as needed for nausea or vomiting.  Anxiety and depression Comments: After discussion, patient would like to start below medication. Expectations, risks, and potential side effects reviewed. She will wean the Lexapro until after her trip, then start Cymbalta.  Orders: -     DULoxetine (CYMBALTA) 20 MG capsule; Take 1 capsule (20 mg total) by mouth daily.    . Reviewed expectations re: course of current medical issues. . Discussed self-management of symptoms. . Outlined signs and symptoms indicating need for more acute intervention. . Patient verbalized understanding and all questions were answered. Marland Kitchen Health Maintenance issues including appropriate healthy diet, exercise, and smoking avoidance were discussed with patient. . See orders for this visit as documented in the electronic medical record. . Patient received an After Visit Summary.   Briscoe Deutscher, DO [place] [place]  [facility] 10/04/2017  Future Appointments  Date Time Provider [place]  11/07/2017 10:00 AM Gerda Diss, DO [facility]  04/01/2018  9:00 AM Briscoe Deutscher, DO [facility]

## 2017-10-03 ENCOUNTER — Encounter: Payer: Self-pay | Admitting: Family Medicine

## 2017-10-04 MED ORDER — CYCLOBENZAPRINE HCL 5 MG PO TABS: 5.0000 mg | ORAL_TABLET | Freq: Three times a day (TID) | ORAL | 1 refills | Status: DC | PRN

## 2017-10-10 ENCOUNTER — Encounter: Payer: Self-pay | Admitting: Family Medicine

## 2017-10-17 ENCOUNTER — Other Ambulatory Visit: Payer: Self-pay

## 2017-10-17 MED ORDER — AZITHROMYCIN 250 MG PO TABS: ORAL_TABLET | ORAL | 0 refills | Status: DC

## 2017-10-24 ENCOUNTER — Encounter: Payer: Self-pay | Admitting: Family Medicine

## 2017-10-31 ENCOUNTER — Other Ambulatory Visit: Payer: Self-pay | Admitting: Family Medicine

## 2017-10-31 DIAGNOSIS — G43109 Migraine with aura, not intractable, without status migrainosus: Secondary | ICD-10-CM

## 2017-11-01 ENCOUNTER — Encounter: Payer: Self-pay | Admitting: Family Medicine

## 2017-11-01 MED ORDER — BUTALBITAL-APAP-CAFFEINE 50-325-40 MG PO TABS: 1.0000 | ORAL_TABLET | Freq: Four times a day (QID) | ORAL | 0 refills | Status: DC | PRN

## 2017-11-01 NOTE — Telephone Encounter (Signed)
Okay refill. 

## 2017-11-01 NOTE — Telephone Encounter (Signed)
Last given 09/17/17 ok to refill? w

## 2017-11-03 ENCOUNTER — Other Ambulatory Visit: Payer: Self-pay | Admitting: Family Medicine

## 2017-11-03 DIAGNOSIS — F419 Anxiety disorder, unspecified: Principal | ICD-10-CM

## 2017-11-03 DIAGNOSIS — F32A Depression, unspecified: Secondary | ICD-10-CM

## 2017-11-03 DIAGNOSIS — F329 Major depressive disorder, single episode, unspecified: Secondary | ICD-10-CM

## 2017-11-07 ENCOUNTER — Encounter: Payer: Self-pay | Admitting: Sports Medicine

## 2017-11-07 ENCOUNTER — Ambulatory Visit (INDEPENDENT_AMBULATORY_CARE_PROVIDER_SITE_OTHER): Payer: No Typology Code available for payment source | Admitting: Sports Medicine

## 2017-11-07 VITALS — BP 116/76 | HR 95 | Ht 66.0 in | Wt 170.4 lb

## 2017-11-07 DIAGNOSIS — G540 Brachial plexus disorders: Secondary | ICD-10-CM

## 2017-11-07 DIAGNOSIS — M9902 Segmental and somatic dysfunction of thoracic region: Secondary | ICD-10-CM | POA: Diagnosis not present

## 2017-11-07 DIAGNOSIS — M542 Cervicalgia: Secondary | ICD-10-CM | POA: Diagnosis not present

## 2017-11-07 DIAGNOSIS — M25872 Other specified joint disorders, left ankle and foot: Secondary | ICD-10-CM

## 2017-11-07 DIAGNOSIS — M79601 Pain in right arm: Secondary | ICD-10-CM | POA: Diagnosis not present

## 2017-11-07 DIAGNOSIS — M7918 Myalgia, other site: Secondary | ICD-10-CM

## 2017-11-07 DIAGNOSIS — M76822 Posterior tibial tendinitis, left leg: Secondary | ICD-10-CM

## 2017-11-07 DIAGNOSIS — M79602 Pain in left arm: Secondary | ICD-10-CM

## 2017-11-07 DIAGNOSIS — R202 Paresthesia of skin: Secondary | ICD-10-CM

## 2017-11-07 NOTE — Progress Notes (Signed)
PROCEDURE NOTE : OSTEOPATHIC MANIPULATION The decision today to treat with Osteopathic Manipulative Therapy (OMT) was based on physical exam findings. Verbal consent was obtained following a discussion with the patient regarding the of risks, benefits and potential side effects, including an acute pain flare,post manipulation soreness and need for repeat treatments. Additionally, we specifically discussed the minimal risk of  injury to neurovascular structures associated with Cervical manipulation.   Contraindications to OMT reviewed and include: NONE  Manipulation was performed as below: Regions treated: Thoracic spine OMT Techniques Used: HVLA, muscle energy and myofascial release  The patient tolerated the treatment well and reported Improved symptoms following treatment today. Patient was given medications, exercises, stretches and lifestyle modifications per AVS and verbally.   OSTEOPATHIC/STRUCTURAL EXAM:   Somewhat limited motion of the thoracic spine and ribs.  She has a slight rotation from T2-T6 neutral      Rotated right, side bent left.

## 2017-11-07 NOTE — Progress Notes (Signed)
  [name] [name], [name] [name]  [name] - 37 y.o. female MRN [id number]  Date of birth: 07-20-1981  Visit Date: 11/07/2017  PCP: Briscoe Deutscher, DO   Referred by: Briscoe Deutscher, DO   Scribe for today's visit: [name], LAT, ATC     SUBJECTIVE:  [name] "[name] [name]" is here for Follow-up (B carpal tunnel and L ankle pain) .   Notes from 09/26/17: Compared to the last office visit, her previously described symptoms show no change. Numbness in hands isn't constant anymore, just has occasional flare-up.  Current symptoms are moderate & are nonradiating She has been wearing braces at night. She has not been able to start Lyrica d/t cost. She has been working on home exercises.   Pt continues to have knots in her neck. She is also experiencing lower back pain  She has been wrapping her LT ankle but she has not been using longitudinal arch support.    Compared to the last office visit on 09/26/17, her previously described L ankle pain symptoms show no change.  She states that she con't to have pain and swelling in her L ankle.  She notes that her L ankle pain is causing her L lower back to bother her.  She also wants to talk about her L shoulder.  Notes a pressure point in her L ant shoulder. Current symptoms are mod in the L ankle and mod-severe in the L ant shoulder & are radiating in the L UE due to the L ant shoulder pain. She has been taking Nortriptyline HCl.  SOLE orthotics.  Has tried the Lidocaine patches for her L shoulder.    ROS Reports night time disturbances. Denies fevers, chills, or night sweats. Denies unexplained weight loss. Denies personal history of cancer. Denies changes in bowel or bladder habits. Denies recent unreported falls. Denies new or worsening dyspnea or wheezing. Reports headaches or dizziness.  Reports numbness, tingling or weakness  In the extremities - B hands Denies  dizziness or presyncopal episodes Denies lower extremity edema    HISTORY & PERTINENT PRIOR DATA:  Prior History reviewed and updated per electronic medical record.  Significant history, findings, studies and interim changes include:  reports that she has been smoking.  she has never used smokeless tobacco. No results for input(s): HGBA1C, LABURIC, CREATINE in the last 8760 hours. Unable to take ibuprofen and yellow dye due to hives.  Sed rate and CRP obtained on 08/15/2017 are remarkably normal with a normal B12, TSH of 0.82 sed rate of 7  03/27/2017 - vitamin D 54 No problems updated.     Please see additional documentation for Objective, Assessment and Plan sections. Pertinent additional documentation may be included in corresponding procedure notes, imaging studies, problem based documentation and patient instructions. Please see these sections of the encounter for additional information regarding this visit.  CMA/ATC served as Education administrator during this visit. History, Physical, and Plan performed by medical provider. Documentation and orders reviewed and attested to.      Gerda Diss, [facility] Physician

## 2017-11-07 NOTE — Patient Instructions (Addendum)
I recommend that you obtain over-the-counter SOLE  medium cushioned insoles.  These can be found at [employer], [employer] - or on-line at [employer]  Search for "SOLE Active Medium Shoe Insoles"  Please perform the exercise program that we have prepared for you and gone over in detail on a daily basis.  In addition to the handout you were provided you can access your program through: [email or web address]-exercise-[email or web address]   Your unique program code is:  [phone]

## 2017-11-07 NOTE — Progress Notes (Signed)
  OBJECTIVE:  VS:  HT:5\' 6"  (167.6 cm)   WT:170 lb 6.4 oz (77.3 kg)  BMI:27.52    BP:116/76  HR:95bpm  TEMP: ( )  RESP:97 %   PHYSICAL EXAM: WDWN, Non-toxic appearing. Psychiatric: Alert & appropriately interactive.  Not depressed or anxious appearing. Respiratory: No increased work of breathing.  Trachea Midline Eyes: Pupils are equal.  EOM intact without nystagmus.  No scleral icterus  NEUROVASCULAR exam: No clubbing or cyanosis appreciated No significant venous stasis changes normal, less than 2 seconds   Upper extremities overall well aligned.  She has a sensation of her pulse with Lorin [facility] testing as well as give compression of the clavicle.  Pain with arm squeeze test as well as with brachial plexus squeeze bilaterally.  Pain with palpation of bilateral carpal tunnel compression tests but this is mild and worsened with brachial plexus squeeze more so than carpal tunnel compression.  Next  Lower extremities she has some pain within the posterior tibialis tendon but this is mild.  ASSESSMENT & PLAN:   1. Pain of left upper extremity   2. Myofascial pain   3. Neck pain   4. Ankle impingement syndrome, left   5. Paresthesia and pain of both upper extremities   6. Thoracic outlet syndrome   7. Somatic dysfunction of thoracic region   8. Posterior tibial tendinitis of left lower extremity     Orders & Meds: No orders of the defined types were placed in this encounter. No orders of the defined types were placed in this encounter.  PLAN: Patient is a multifactorial issues.  With positive Roo's test I do think that she likely has some triggering component of thoracic outlet syndrome that is causing some of her upper extremity symptoms in addition to carpal tunnel syndrome and myofascial pain syndrome.  I would like for her to begin working on therapeutic exercises per procedure note and performed osteopathic manipulation per procedure note.  We will plan to follow-up with her in 3 weeks  to consider repeat osteopathic manipulation.  Can consider referral to formal physical therapy if indicated.  No problem-specific Assessment & Plan notes found for this encounter.  Follow-up: Return in about 3 weeks (around 11/28/2017).

## 2017-11-07 NOTE — Progress Notes (Signed)
PROCEDURE NOTE: THERAPEUTIC EXERCISES (97110) 15 minutes spent for Therapeutic exercises as below and as referenced in the AVS. This included exercises focusing on stretching, strengthening, with significant focus on eccentric aspects.  Proper technique shown and discussed handout in great detail with ATC. All questions were discussed and answered.   Long term goals include an improvement in range of motion, strength, endurance as well as avoiding reinjury. Frequency of visits is one time as determined during today's  office visit. Frequency of exercises to be performed is as per handout.  EXERCISES REVIEWED:  [place] stretching Pec Major/Minor stretching Thoracic Aeronautical engineer

## 2017-11-08 ENCOUNTER — Encounter: Payer: Self-pay | Admitting: Sports Medicine

## 2017-11-26 ENCOUNTER — Other Ambulatory Visit: Payer: Self-pay | Admitting: Family Medicine

## 2017-11-26 DIAGNOSIS — M7918 Myalgia, other site: Secondary | ICD-10-CM

## 2017-11-26 NOTE — Telephone Encounter (Signed)
Ok to refill 

## 2017-11-27 ENCOUNTER — Ambulatory Visit: Payer: No Typology Code available for payment source | Admitting: Sports Medicine

## 2017-11-27 ENCOUNTER — Encounter: Payer: Self-pay | Admitting: Sports Medicine

## 2017-11-27 VITALS — BP 112/80 | HR 100 | Ht 66.0 in | Wt 165.0 lb

## 2017-11-27 DIAGNOSIS — G540 Brachial plexus disorders: Secondary | ICD-10-CM | POA: Diagnosis not present

## 2017-11-27 DIAGNOSIS — M79602 Pain in left arm: Secondary | ICD-10-CM

## 2017-11-27 DIAGNOSIS — M542 Cervicalgia: Secondary | ICD-10-CM | POA: Diagnosis not present

## 2017-11-27 DIAGNOSIS — M25872 Other specified joint disorders, left ankle and foot: Secondary | ICD-10-CM

## 2017-11-27 DIAGNOSIS — M7918 Myalgia, other site: Secondary | ICD-10-CM | POA: Diagnosis not present

## 2017-11-27 MED ORDER — DICLOFENAC SODIUM 2 % TD SOLN: 1.0000 "application " | Freq: Two times a day (BID) | TRANSDERMAL | 0 refills | Status: AC

## 2017-11-27 MED ORDER — DICLOFENAC SODIUM 2 % TD SOLN: 1.0000 "application " | Freq: Two times a day (BID) | TRANSDERMAL | 2 refills | Status: DC

## 2017-11-27 MED ORDER — PREGABALIN 75 MG PO CAPS: 75.0000 mg | ORAL_CAPSULE | Freq: Two times a day (BID) | ORAL | 2 refills | Status: DC

## 2017-11-27 NOTE — Patient Instructions (Signed)
[facility] instructions for Duexis, Pennsaid and Vimovo:  Your prescription will be filled through a mail order pharmacy.  It is typically [facility] but may vary depending on where you live.  You will receive a phone call from them which will typically come from a [phone] phone number.  You must speak directly to them to have this medication filled.  When the pharmacy calls, they will need your mailing address (for overnight shipment of the medication) andy they will need payment information if you have a copay (typically no more than $10). If you have not heard from them 2-3 days after your appointment with Dr. [name], contact us at the office ([phone]) or through MyChart so we can reach back out to the pharmacy.  

## 2017-11-27 NOTE — Progress Notes (Signed)
[name] [name]. [name] [name], [facility] [facility] [place]  [name] [name] - 37 y.o. female MRN [id number]  Date of birth: 1981-02-06  Visit Date: 11/27/2017  PCP: Briscoe Deutscher, DO   Referred by: Briscoe Deutscher, DO  Scribe for today's visit: Josepha Pigg, CMA     SUBJECTIVE:  [name] [name] "[name] [name]" is here for Follow-up (L ankle pain, L shoulder pain)  Notes from 09/26/17: Compared to the last office visit, her previously described symptoms show no change. Numbness in hands isn't constant anymore, just has occasional flare-up.  Current symptoms are moderate & are nonradiating She has been wearing braces at night. She has not been able to start Lyrica d/t cost. She has been working on home exercises.  Pt continues to have knots in her neck. She is also experiencing lower back pain She has been wrapping her LT ankle but she has not been using longitudinal arch support.   11/07/17: Compared to the last office visit on 09/26/17, her previously described L ankle pain symptoms show no change.  She states that she con't to have pain and swelling in her L ankle.  She notes that her L ankle pain is causing her L lower back to bother her.  She also wants to talk about her L shoulder.  Notes a pressure point in her L ant shoulder. Current symptoms are mod in the L ankle and mod-severe in the L ant shoulder & are radiating in the L UE due to the L ant shoulder pain. She has not been taking Nortriptyline HCl.  SOLE orthotics.  Has tried the Lidocaine patches for her L shoulder.   11/27/2017: Compared to the last office visit, her previously described symptoms of L ankle pain show no change. She has started to notice a sharp pain in her R calf Current symptoms are moderate & are nonradiating when bending forward.  She has been wearing insoles but she can only put them in her tennis shoes. She has been taking Lyrica with good relief.   Compared to the last  office visit, her previously described symptoms of L shoulder pain are improving, the n/t in L arm has improved.  Current symptoms are moderate & are radiating to L arm.  She has tried Lidocaine patch in the past with some relief. She has been trying to use foam roller for her back but has been unable to "pop" her back. She has gotten good relief from OMT in the past. She has been taking Flexeril at bedtime with some relief. She has completed Medrol Dosepak.   ROS Reports night time disturbances. Denies fevers, chills, or night sweats. Denies unexplained weight loss. Denies personal history of cancer. Denies changes in bowel or bladder habits. Denies recent unreported falls. Denies new or worsening dyspnea or wheezing. Reports headaches.  Reports numbness, tingling or weakness in the extremities.  Denies dizziness or presyncopal episodes Denies lower extremity edema    HISTORY & PERTINENT PRIOR DATA:  Prior History reviewed and updated per electronic medical record.  Significant/pertinent history, findings, studies include:  reports that she has been smoking.  She has never used smokeless tobacco. No results for input(s): HGBA1C, LABURIC, CREATINE in the last 8760 hours. Unable to take ibuprofen and yellow dye due to hives.  Sed rate and CRP obtained on 08/15/2017 are remarkably normal with a normal B12, TSH of 0.82 sed rate of 7  03/27/2017 - vitamin D 54 No problems updated.  OBJECTIVE:  VS:  HT:5\' 6"  (167.6 cm)   WT:165 lb (74.8 kg)  BMI:26.64    BP:112/80  HR:100bpm  TEMP: ( )  RESP:99 %   PHYSICAL EXAM: Constitutional: WDWN, Non-toxic appearing. Psychiatric: Alert & appropriately interactive.  Not depressed or anxious appearing. Respiratory: No increased work of breathing.  Trachea Midline Eyes: Pupils are equal.  EOM intact without nystagmus.  No scleral icterus  Vascular Exam: warm to touch no edema  upper and lower extremity neuro exam: unremarkable normal  strength normal sensation normal reflexes  MSK Exam: Full overhead range motion bilateral shoulders.  Ankle is overall less tender over the anterior lateral ankle.    ASSESSMENT & PLAN:   1. Pain of left upper extremity   2. Myofascial pain   3. Neck pain   4. Ankle impingement syndrome, left   5. Thoracic outlet syndrome     PLAN:  Thoracic spine has overall improved rotation.  Her upper tract symptoms have improved.  Otherwise see osteopathic exam findings.  Her ankle has had mild improvement as well and we will have her continue using the anti-inflammatories.  If any lack of improvement could consider further diagnostic evaluation of this.   Osteopathic manipulation was performed today based on physical exam findings.  Please see procedure note for further information including Osteopathic Exam findings.  PROCEDURE NOTE : OSTEOPATHIC MANIPULATION The decision today to treat with Osteopathic Manipulative Therapy (OMT) was based on physical exam findings. Verbal consent was obtained following a discussion with the patient regarding the of risks, benefits and potential side effects, including an acute pain flare,post manipulation soreness and need for repeat treatments.     Contraindications to OMT reviewed and include: NONE  Manipulation was performed as below: Regions treated: Cervical spine, Ribs and Thoracic spine OMT Techniques Used: HVLA, muscle energy and myofascial release  The patient tolerated the treatment well and reported Improved symptoms following treatment today. Patient was given medications, exercises, stretches and lifestyle modifications per AVS and verbally.   OSTEOPATHIC/STRUCTURAL EXAM:   OA - rotated right C2 - C4 Neutral, Rotated left, Sidebent right C5 FRS right (Flexed, Rotated & Sidebent) T2 - T5 Neutral, Rotated right, Sidebent left T8 ERS right (Extended, Rotated & Sidebent) Rib 7 Left  Posterior Rib 10 Right  Exhalation dysfunction  (elevated/inhaled)   Follow-up: Return in about 1 week (around 12/04/2017).      Please see additional documentation for Objective, Assessment and Plan sections. Pertinent additional documentation may be included in corresponding procedure notes, imaging studies, problem based documentation and patient instructions. Please see these sections of the encounter for additional information regarding this visit.  CMA/ATC served as Education administrator during this visit. History, Physical, and Plan performed by medical provider. Documentation and orders reviewed and attested to.      Gerda Diss, [place] [facility] Physician

## 2017-11-30 ENCOUNTER — Other Ambulatory Visit: Payer: Self-pay

## 2017-11-30 ENCOUNTER — Encounter: Payer: Self-pay | Admitting: Sports Medicine

## 2017-11-30 NOTE — Telephone Encounter (Signed)
Spoke with patient, she broke out in hives on her scalp, ears, and face. She took Benadryl and is feeling better though sx have no completely resolved. Sx continue to improve over time. She has d/c Pennsaid and it has been removed from her med list. NSAIDs are on allergy list. Made Dr. [name] verbally aware as Dr. Paulla Fore is out of the office today. Will forward to Dr. Paulla Fore as Juluis Rainier when he returns. Pt is scheduled to follow-up 12/04/2017.

## 2017-12-04 ENCOUNTER — Ambulatory Visit: Payer: No Typology Code available for payment source | Admitting: Sports Medicine

## 2017-12-04 DIAGNOSIS — Z0289 Encounter for other administrative examinations: Secondary | ICD-10-CM

## 2017-12-10 ENCOUNTER — Other Ambulatory Visit: Payer: Self-pay | Admitting: Family Medicine

## 2017-12-10 DIAGNOSIS — Z7184 Encounter for health counseling related to travel: Secondary | ICD-10-CM

## 2017-12-11 ENCOUNTER — Ambulatory Visit: Payer: No Typology Code available for payment source | Admitting: Sports Medicine

## 2017-12-11 ENCOUNTER — Encounter: Payer: Self-pay | Admitting: Sports Medicine

## 2017-12-11 VITALS — BP 126/80 | HR 96 | Ht 66.0 in | Wt 172.4 lb

## 2017-12-11 DIAGNOSIS — M79602 Pain in left arm: Secondary | ICD-10-CM

## 2017-12-11 DIAGNOSIS — M9901 Segmental and somatic dysfunction of cervical region: Secondary | ICD-10-CM

## 2017-12-11 DIAGNOSIS — M1811 Unilateral primary osteoarthritis of first carpometacarpal joint, right hand: Secondary | ICD-10-CM | POA: Diagnosis not present

## 2017-12-11 DIAGNOSIS — G540 Brachial plexus disorders: Secondary | ICD-10-CM | POA: Diagnosis not present

## 2017-12-11 DIAGNOSIS — M9908 Segmental and somatic dysfunction of rib cage: Secondary | ICD-10-CM | POA: Diagnosis not present

## 2017-12-11 DIAGNOSIS — M9902 Segmental and somatic dysfunction of thoracic region: Secondary | ICD-10-CM | POA: Diagnosis not present

## 2017-12-11 DIAGNOSIS — M542 Cervicalgia: Secondary | ICD-10-CM | POA: Diagnosis not present

## 2017-12-11 DIAGNOSIS — M7918 Myalgia, other site: Secondary | ICD-10-CM

## 2017-12-11 NOTE — Telephone Encounter (Signed)
Ok to refill 

## 2017-12-11 NOTE — Progress Notes (Signed)
[name] [name]. [name] [name], [name] [name]  [name] - 37 y.o. female MRN [id number]  Date of birth: Apr 12, 1981  Visit Date: 12/11/2017  PCP: Briscoe Deutscher, DO   Referred by: Briscoe Deutscher, DO  Scribe for today's visit: [name], LAT, ATC     SUBJECTIVE:  [name] [name] "[name] [name]" is here for Follow-up (Neck pain, L shoulder pain and L ankle impingement) .   Notes from 09/26/17: Compared to the last office visit, her previously described symptoms show no change. Numbness in hands isn't constant anymore, just has occasional flare-up.  Current symptoms are moderate & are nonradiating She has been wearing braces at night. She has not been able to start Lyrica d/t cost. She has been working on home exercises.  Pt continues to have knots in her neck. She is also experiencing lower back pain She has been wrapping her LT ankle but she has not been using longitudinal arch support.   11/07/17: Compared to the last office visit on 09/26/17, her previously described L ankle pain symptoms show no change.  She states that she con't to have pain and swelling in her L ankle.  She notes that her L ankle pain is causing her L lower back to bother her.  She also wants to talk about her L shoulder.  Notes a pressure point in her L ant shoulder. Current symptoms are mod in the L ankle and mod-severe in the L ant shoulder & are radiating in the L UE due to the L ant shoulder pain. She has not been taking Nortriptyline HCl.  SOLE orthotics.  Has tried the Lidocaine patches for her L shoulder.   11/27/2017: Compared to the last office visit, her previously described symptoms of L ankle pain show no change. She has started to notice a sharp pain in her R calf Current symptoms are moderate & are nonradiating when bending forward.  She has been wearing insoles but she can only put them in her tennis shoes. She has been taking Lyrica with good relief.     Compared to the last office visit, her previously described symptoms of L shoulder pain are improving, the n/t in L arm has improved.  Current symptoms are moderate & are radiating to L arm.  She has tried Lidocaine patch in the past with some relief. She has been trying to use foam roller for her back but has been unable to "pop" her back. She has gotten good relief from OMT in the past. She has been taking Flexeril at bedtime with some relief. She has completed Medrol Dosepak.   12/11/17: Compared to the last office visit, her previously described L ankle pain symptoms are improving w/ decreased pain. Current symptoms are mild-mod & are radiating at times into the L foot and L distal lower leg. She has been wearing her SOLE insoles and has been using Lyrica.   Compared to the last office visit, her previously described L shoulder pain symptoms are improving slightly but hard to tell.  Notes the R shoulder is bothering her more today. Current symptoms are moderate & are radiating to L UE She has been taking Flexeril at night.  She states that she feels like the OMT adjustment really helped at her last visit, especially w/  the L SIJ.  Hands - states that she had an allergic reaction to the Pennsaid so is no longer using that.  Notes that she has  been wearing her wrist splints both at work and at night while sleeping.  She also reports a new N/T sensation in the webspace of her hands which is different than before.  However, she states that overall her N/T is improved from previously.  ROS Reports night time disturbances. Denies fevers, chills, or night sweats. Denies unexplained weight loss. Denies personal history of cancer. Denies changes in bowel or bladder habits. Denies recent unreported falls. Denies new or worsening dyspnea or wheezing. Reports headaches or dizziness.  Reports numbness, tingling or weakness  In the extremities - B hands but less than previously. Denies  dizziness or presyncopal episodes Denies lower extremity edema    HISTORY & PERTINENT PRIOR DATA:  Prior History reviewed and updated per electronic medical record.  Significant/pertinent history, findings, studies include:  reports that she has been smoking.  She has never used smokeless tobacco. No results for input(s): HGBA1C, LABURIC, CREATINE in the last 8760 hours. Unable to take ibuprofen and yellow dye due to hives.  Sed rate and CRP obtained on 08/15/2017 are remarkably normal with a normal B12, TSH of 0.82 sed rate of 7  03/27/2017 - vitamin D 54 No problems updated.  OBJECTIVE:  VS:  HT:5\' 6"  (167.6 cm)   WT:172 lb 6.4 oz (78.2 kg)  BMI:27.84    BP:126/80  HR:96bpm  TEMP: ( )  RESP:98 %   PHYSICAL EXAM: Constitutional: WDWN, Non-toxic appearing. Psychiatric: Alert & appropriately interactive.  Not depressed or anxious appearing. Respiratory: No increased work of breathing.  Trachea Midline Eyes: Pupils are equal.  EOM intact without nystagmus.  No scleral icterus  Vascular Exam: warm to touch no edema  upper extremity neuro exam: unremarkable  MSK Exam: Multiple trigger points.  She has overall good improvement of her neck thoracic spine and shoulders.  She does have some CMC joint arthritis of the right hand with tenderness to palpation directly over this area.  Slight subluxation with valgus stressing but this is minimal.   ASSESSMENT & PLAN:   1. Myofascial pain   2. Neck pain   3. Pain of left upper extremity   4. Primary osteoarthritis of first carpometacarpal joint of right hand   5. Somatic dysfunction of cervical region   6. Somatic dysfunction of thoracic region   7. Somatic dysfunction of rib cage region     PLAN: Osteopathic manipulation was performed today based on physical exam findings.  Please see procedure note for further information including Osteopathic Exam findings  Continue with home therapeutic exercises.  Her symptoms have  improved following manipulations and I believe her thoracic outlet syndrome is heading in the right direction.  We will plan to check in with her in 3 weeks and plan to repeat manipulation at that time if indicated.   Follow-up: Return in about 3 weeks (around 01/01/2018).    PROCEDURE NOTE : OSTEOPATHIC MANIPULATION The decision today to treat with Osteopathic Manipulative Therapy (OMT) was based on physical exam findings. Verbal consent was obtained following a discussion with the patient regarding the of risks, benefits and potential side effects, including an acute pain flare,post manipulation soreness and need for repeat treatments.     Contraindications to OMT reviewed and include: NONE  Manipulation was performed as below: Regions treated: Cervical spine, Ribs and Thoracic spine OMT Techniques Used: HVLA, muscle energy and myofascial release  The patient tolerated the treatment well and reported Improved symptoms following treatment today. Patient was given medications, exercises, stretches and lifestyle modifications per  AVS and verbally.   OSTEOPATHIC/STRUCTURAL EXAM:   OA - rotated right T2 - T6 Neutral, Rotated right, Sidebent left T8 ERS left (Extended, Rotated & Sidebent) Rib 7 Left  Posterior     Please see additional documentation for Objective, Assessment and Plan sections. Pertinent additional documentation may be included in corresponding procedure notes, imaging studies, problem based documentation and patient instructions. Please see these sections of the encounter for additional information regarding this visit.  CMA/ATC served as Education administrator during this visit. History, Physical, and Plan performed by medical provider. Documentation and orders reviewed and attested to.      Gerda Diss, [place] [facility] Physician

## 2018-01-02 ENCOUNTER — Other Ambulatory Visit: Payer: Self-pay | Admitting: Family Medicine

## 2018-01-02 ENCOUNTER — Encounter: Payer: Self-pay | Admitting: Sports Medicine

## 2018-01-02 DIAGNOSIS — M7918 Myalgia, other site: Secondary | ICD-10-CM

## 2018-01-03 ENCOUNTER — Encounter: Payer: Self-pay | Admitting: Sports Medicine

## 2018-01-03 NOTE — Telephone Encounter (Signed)
Ok to refill 

## 2018-01-03 NOTE — Telephone Encounter (Signed)
Ok to fill 

## 2018-01-04 MED ORDER — CYCLOBENZAPRINE HCL 5 MG PO TABS: 5.0000 mg | ORAL_TABLET | Freq: Every day | ORAL | 0 refills | Status: AC

## 2018-01-08 ENCOUNTER — Encounter: Payer: Self-pay | Admitting: Sports Medicine

## 2018-01-08 ENCOUNTER — Ambulatory Visit: Payer: No Typology Code available for payment source | Admitting: Sports Medicine

## 2018-01-08 ENCOUNTER — Ambulatory Visit: Payer: No Typology Code available for payment source | Admitting: Physician Assistant

## 2018-01-08 ENCOUNTER — Encounter: Payer: Self-pay | Admitting: Physician Assistant

## 2018-01-08 VITALS — BP 140/80 | HR 76 | Ht 66.0 in | Wt 170.2 lb

## 2018-01-08 VITALS — BP 130/80 | HR 86 | Ht 66.0 in | Wt 170.2 lb

## 2018-01-08 DIAGNOSIS — F419 Anxiety disorder, unspecified: Secondary | ICD-10-CM | POA: Diagnosis not present

## 2018-01-08 DIAGNOSIS — M9902 Segmental and somatic dysfunction of thoracic region: Secondary | ICD-10-CM

## 2018-01-08 DIAGNOSIS — F329 Major depressive disorder, single episode, unspecified: Secondary | ICD-10-CM

## 2018-01-08 DIAGNOSIS — M9904 Segmental and somatic dysfunction of sacral region: Secondary | ICD-10-CM

## 2018-01-08 DIAGNOSIS — M1811 Unilateral primary osteoarthritis of first carpometacarpal joint, right hand: Secondary | ICD-10-CM | POA: Diagnosis not present

## 2018-01-08 DIAGNOSIS — R21 Rash and other nonspecific skin eruption: Secondary | ICD-10-CM | POA: Diagnosis not present

## 2018-01-08 DIAGNOSIS — M9907 Segmental and somatic dysfunction of upper extremity: Secondary | ICD-10-CM | POA: Diagnosis not present

## 2018-01-08 DIAGNOSIS — M9903 Segmental and somatic dysfunction of lumbar region: Secondary | ICD-10-CM | POA: Diagnosis not present

## 2018-01-08 DIAGNOSIS — M542 Cervicalgia: Secondary | ICD-10-CM | POA: Diagnosis not present

## 2018-01-08 DIAGNOSIS — M79602 Pain in left arm: Secondary | ICD-10-CM | POA: Diagnosis not present

## 2018-01-08 DIAGNOSIS — M7918 Myalgia, other site: Secondary | ICD-10-CM | POA: Diagnosis not present

## 2018-01-08 DIAGNOSIS — M9905 Segmental and somatic dysfunction of pelvic region: Secondary | ICD-10-CM | POA: Diagnosis not present

## 2018-01-08 DIAGNOSIS — M9901 Segmental and somatic dysfunction of cervical region: Secondary | ICD-10-CM | POA: Diagnosis not present

## 2018-01-08 DIAGNOSIS — M9908 Segmental and somatic dysfunction of rib cage: Secondary | ICD-10-CM | POA: Diagnosis not present

## 2018-01-08 DIAGNOSIS — F32A Depression, unspecified: Secondary | ICD-10-CM

## 2018-01-08 MED ORDER — CLOTRIMAZOLE-BETAMETHASONE 1-0.05 % EX LOTN: TOPICAL_LOTION | Freq: Two times a day (BID) | CUTANEOUS | 0 refills | Status: DC

## 2018-01-08 MED ORDER — DULOXETINE HCL 30 MG PO CPEP: 30.0000 mg | ORAL_CAPSULE | Freq: Every day | ORAL | 2 refills | Status: DC

## 2018-01-08 NOTE — Progress Notes (Signed)
PROCEDURE NOTE : OSTEOPATHIC MANIPULATION The decision today to treat with Osteopathic Manipulative Therapy (OMT) was based on physical exam findings. Verbal consent was obtained following a discussion with the patient regarding the of risks, benefits and potential side effects, including an acute pain flare,post manipulation soreness and need for repeat treatments.     Contraindications; none  Manipulation was performed as below: Regions treated: Cervical spine, Ribs, Thoracic spine, Lumbar spine, Pelvis and Sacrum OMT Techniques Used: HVLA, muscle energy and myofascial release  The patient tolerated the treatment well and reported Improved symptoms following treatment today. Patient was given medications, exercises, stretches and lifestyle modifications per AVS and verbally.   OSTEOPATHIC/STRUCTURAL EXAM:   OA - rotated right C2 FRS right (Flexed, Rotated & Sidebent) C6 ERS right (Extended, Rotated & Sidebent) T4 FRS right (Flexed, Rotated & Sidebent) T8 - T10 Neutral, Rotated right, Sidebent left L3 FRS right (Flexed, Rotated & Sidebent) Rib 7Left  Posterior Right psoas spasm Right anterior innonimate R on L sacral torsion UE  - bilateral thumb subluxuations

## 2018-01-08 NOTE — Patient Instructions (Signed)
It was great to see you!  Start using the cream we have sent in (it's part steroid and part anti-fungal)  STOP USING THE TANNING BED!  :)

## 2018-01-08 NOTE — Progress Notes (Signed)
[name] [name] is a 37 y.o. female here for a new problem.  I acted as a Education administrator for [employer], PA-C [name] Pickler, LPN  History of Present Illness:   Chief Complaint  Patient presents with  . Rash    Rash  This is a new problem. Episode onset: Started 10 days ago. Location: bialteral breast area, gluteal folds. The rash is characterized by itchiness, redness, swelling and pain. She was exposed to nothing. Pertinent negatives include no cough, diarrhea, fatigue or shortness of breath. Past treatments include anti-itch cream (Eczema cream). The treatment provided no relief.   She has been using the tanning bed for a few months now.   Past Medical History:  Diagnosis Date  . ADHD (attention deficit hyperactivity disorder)   . Anxiety   . Depression   . Gestational diabetes   . History of chicken pox   . History of condyloma acuminatum   . Migraines      Social History   Socioeconomic History  . Marital status: Married    Spouse name: Not on file  . Number of children: 2  . Years of education: Not on file  . Highest education level: Not on file  Occupational History  . Occupation: Doesn't work  Scientific laboratory technician  . Financial resource strain: Not on file  . Food insecurity:    Worry: Not on file    Inability: Not on file  . Transportation needs:    Medical: Not on file    Non-medical: Not on file  Tobacco Use  . Smoking status: Current Some Day Smoker  . Smokeless tobacco: Never Used  . Tobacco comment: smokes socially (1 cigarette a week)  Substance and Sexual Activity  . Alcohol use: No    Alcohol/week: 0.0 oz  . Drug use: No  . Sexual activity: Yes  Lifestyle  . Physical activity:    Days per week: Not on file    Minutes per session: Not on file  . Stress: Not on file  Relationships  . Social connections:    Talks on phone: Not on file    Gets together: Not on file    Attends religious service: Not on file    Active member of club or organization: Not  on file    Attends meetings of clubs or organizations: Not on file    Relationship status: Not on file  . Intimate partner violence:    Fear of current or ex partner: Not on file    Emotionally abused: Not on file    Physically abused: Not on file    Forced sexual activity: Not on file  Other Topics Concern  . Not on file  Social History Narrative   2012- daughter   2016- son   Married   Does not work.  She was previously a Emergency planning/management officer, last worked in 2012.    Enjoys reading, shopping, beach    Past Surgical History:  Procedure Laterality Date  . CESAREAN SECTION  06/25/2011  . CESAREAN SECTION N/A 05/04/2015  . CHOLECYSTECTOMY N/A 07/07/2015  . ERCP N/A 08/24/2015  . KNEE CARTILAGE SURGERY Left     Family History  Problem Relation Age of Onset  . Hypertension Mother   . Migraines Mother   . Hypertension Father   . Migraines Sister   . Heart disease Maternal Grandmother   . Heart disease Maternal Grandfather   . Diabetes Paternal Grandmother   . Cancer Paternal Grandfather   . Healthy Son   .  Healthy Daughter   . Anesthesia problems Neg Hx   . Hypotension Neg Hx   . Malignant hyperthermia Neg Hx   . Pseudochol deficiency Neg Hx     Allergies  Allergen Reactions  . Ibuprofen Hives and Other (See Comments)    stuffiness  . Food     Nuts and Yeast  . Latex Hives  . Lovenox [Enoxaparin Sodium] Other (See Comments)    Face flush and redness  . Nsaids Hives  . Other Hives    CATS  . Penicillins Hives  . Yellow Dyes (Non-Tartrazine) Hives    YELLOW No. 5    Current Medications:   Current Outpatient Medications:  .  ALPRAZolam (XANAX) 0.5 MG tablet, Take 0.5 mg by mouth 4 (four) times daily as needed for anxiety. , Disp: , Rfl: 5 .  amphetamine-dextroamphetamine (ADDERALL) 20 MG tablet, Take 20 mg by mouth 3 (three) times daily., Disp: , Rfl:  .  butalbital-acetaminophen-caffeine (FIORICET, ESGIC) 50-325-40 MG tablet, TAKE 1 TO 2 TABLETS BY MOUTH EVERY 6  HOURS AS NEEDED FOR HEADACHE, Disp: 20 tablet, Rfl: 0 .  cetirizine (ZYRTEC) 10 MG tablet, Take 10 mg by mouth daily., Disp: , Rfl:  .  cyclobenzaprine (FLEXERIL) 5 MG tablet, Take 1 tablet (5 mg total) by mouth at bedtime., Disp: 30 tablet, Rfl: 0 .  DULoxetine (CYMBALTA) 30 MG capsule, Take 1 capsule (30 mg total) by mouth daily., Disp: 30 capsule, Rfl: 2 .  MICROGESTIN FE 1/20 1-20 MG-MCG tablet, Take 1 tablet by mouth daily., Disp: , Rfl: 11 .  ondansetron (ZOFRAN) 4 MG tablet, TAKE 1 TABLET BY MOUTH EVERY 8 HOURS AS NEEDED FOR NAUSEA AND VOMITING, Disp: 20 tablet, Rfl: 0 .  pregabalin (LYRICA) 75 MG capsule, Take 1 capsule (75 mg total) by mouth 2 (two) times daily., Disp: 60 capsule, Rfl: 2 .  ranitidine (ZANTAC) 150 MG tablet, Take 150 mg by mouth 2 (two) times daily., Disp: , Rfl:  .  clotrimazole-betamethasone (LOTRISONE) lotion, Apply topically 2 (two) times daily., Disp: 30 mL, Rfl: 0   Review of Systems:   Review of Systems  Constitutional: Negative for fatigue.  Respiratory: Negative for cough and shortness of breath.   Gastrointestinal: Negative for diarrhea.  Skin: Positive for rash.    Vitals:   Vitals:   01/08/18 1055  BP: 140/80  Pulse: 76  SpO2: 99%  Weight: 170 lb 4 oz (77.2 kg)  Height: 5\' 6"  (1.676 m)     Body mass index is 27.48 kg/m.  Physical Exam:   Physical Exam  Constitutional: She is oriented to person, place, and time. She appears well-developed and well-nourished.  HENT:  Head: Normocephalic and atraumatic.  Eyes: Conjunctivae and EOM are normal.  Neck: Normal range of motion. Neck supple.  Pulmonary/Chest: Effort normal.  Musculoskeletal: Normal range of motion.  Neurological: She is alert and oriented to person, place, and time.  Skin: Skin is warm and dry.  Raised erythematous scattered lesions to bilateral folds under breasts, lateral portions of breasts, and along underwear line   Psychiatric: She has a normal mood and affect. Her  behavior is normal. Judgment and thought content normal.    Assessment and Plan:    [name] [name] was seen today for rash.  Diagnoses and all orders for this visit:  Rash and nonspecific skin eruption  Other orders -     clotrimazole-betamethasone (LOTRISONE) lotion; Apply topically 2 (two) times daily.    No red flags on exam. I advised  that patient immediately stop using the tanning bed. Possible fungal vs contact dermatitis. Use lotrisone cream as prescribed. If symptoms worsen or persist, recommend that she follow-up with [place]. Patient verbalized understanding.  . Reviewed expectations re: course of current medical issues. . Discussed self-management of symptoms. . Outlined signs and symptoms indicating need for more acute intervention. . Patient verbalized understanding and all questions were answered. . See orders for this visit as documented in the electronic medical record. . Patient received an After-Visit Summary.  CMA or LPN served as scribe during this visit. History, Physical, and Plan performed by medical provider. Documentation and orders reviewed and attested to.  Inda Coke, PA-C

## 2018-01-08 NOTE — Progress Notes (Signed)
[name] [name]. [name] [name], [facility]  [name] [name] - 37 y.o. female MRN [id number]  Date of birth: [birth year]  Visit Date: 01/08/2018  PCP: Briscoe Deutscher, DO   Referred by: Briscoe Deutscher, DO  Scribe for today's visit: [name] [name], CMA     SUBJECTIVE:  [name] [name] "[name] [name]" is here for Follow-up (neck pain and L shoulder pain)  Notes from 09/26/17: Compared to the last office visit, her previously described symptoms show no change. Numbness in hands isn't constant anymore, just has occasional flare-up.  Current symptoms are moderate & are nonradiating She has been wearing braces at night. She has not been able to start Lyrica d/t cost. She has been working on home exercises.  Pt continues to have knots in her neck. She is also experiencing lower back pain She has been wrapping her LT ankle but she has not been using longitudinal arch support.   11/07/17: Compared to the last office visit on 09/26/17, her previously described L ankle pain symptoms show no change.  She states that she con't to have pain and swelling in her L ankle.  She notes that her L ankle pain is causing her L lower back to bother her.  She also wants to talk about her L shoulder.  Notes a pressure point in her L ant shoulder. Current symptoms are mod in the L ankle and mod-severe in the L ant shoulder & are radiating in the L UE due to the L ant shoulder pain. She has not been taking Nortriptyline HCl.  SOLE orthotics.  Has tried the Lidocaine patches for her L shoulder.   11/27/2017: Compared to the last office visit, her previously described symptoms of L ankle pain show no change. She has started to notice a sharp pain in her R calf Current symptoms are moderate & are nonradiating when bending forward.  She has been wearing insoles but she can only put them in her tennis shoes. She has been taking Lyrica with good relief.   Compared to the last  office visit, her previously described symptoms of L shoulder pain are improving, the n/t in L arm has improved.  Current symptoms are moderate & are radiating to L arm.  She has tried Lidocaine patch in the past with some relief. She has been trying to use foam roller for her back but has been unable to "pop" her back. She has gotten good relief from OMT in the past. She has been taking Flexeril at bedtime with some relief. She has completed Medrol Dosepak.   12/11/17: Compared to the last office visit, her previously described L ankle pain symptoms are improving w/ decreased pain. Current symptoms are mild-mod & are radiating at times into the L foot and L distal lower leg. She has been wearing her SOLE insoles and has been using Lyrica.   Compared to the last office visit, her previously described L shoulder pain symptoms are improving slightly but hard to tell.  Notes the R shoulder is bothering her more today. Current symptoms are moderate & are radiating to L UE She has been taking Flexeril at night.  She states that she feels like the OMT adjustment really helped at her last visit, especially w/  the L SIJ.  Hands - states that she had an allergic reaction to the Pennsaid so is no longer using that.  Notes that she has been wearing her wrist splints both at  work and at night while sleeping.  She also reports a new N/T sensation in the webspace of her hands which is different than before.  However, she states that overall her N/T is improved from previously.  01/08/2018: Neck and back pain Compared to the last office visit, her previously described symptoms show no change. Sx improve after OMT but return within a week or so.   Current symptoms are moderate & are radiating to both arms, L>R and left side. Pain is most severe first thing in the morning and at night.  She has been using Lidocaine patches with some relief. She is also taking Lyrica and Cymbalta with some relief. She feels like  she has reached a plateau with the Lyrica.    R hand/wrist Compared to the last office visit, her previously described symptoms show no change. Current symptoms are severe (R hand) and moderate (L hand) & are non-radiating She has been wearing wrist splint with good relief. Sx comes back when she takes the brace off and wrist/hand swells.   Ankle pain Ankle pain improves with SOLE inserts. She feels that the L ankle has been swelling more recently. She reports having a ganglion cyst on the top of her L foot.  Current symptoms are moderate and non-radiating. Sx vary depending on the day.  She is taking Lyrica with some relief.   ROS Reports night time disturbances. Denies fevers, chills, or night sweats. Denies unexplained weight loss. Denies personal history of cancer. Denies changes in bowel or bladder habits. Denies recent unreported falls. Denies new or worsening dyspnia or wheezing. Reports headaches.  Reports numbness, tingling or weakness  In the extremities.  Denies dizziness or presyncopal episodes Reports lower extremity edema    HISTORY & PERTINENT PRIOR DATA:  Prior History reviewed and updated per electronic medical record.  Significant/pertinent history, findings, studies include:  reports that she has been smoking.  She has never used smokeless tobacco. No results for input(s): HGBA1C, LABURIC, CREATINE in the last 8760 hours. Unable to take ibuprofen and yellow dye due to hives.  Sed rate and CRP obtained on 08/15/2017 are remarkably normal with a normal B12, TSH of 0.82 sed rate of 7  03/27/2017 - vitamin D 54 No problems updated.  OBJECTIVE:  VS:  HT:5\' 6"  (167.6 cm)   WT:170 lb 3.2 oz (77.2 kg)  BMI:27.48    BP:130/80  HR:86bpm  TEMP: ( )  RESP:99 %   PHYSICAL EXAM: Constitutional: WDWN, Non-toxic appearing. Psychiatric: Alert & appropriately interactive.  Not depressed or anxious appearing. Respiratory: No increased work of breathing.  Trachea  Midline Eyes: Pupils are equal.  EOM intact without nystagmus.  No scleral icterus  Vascular Exam: warm to touch no edema  upper and lower extremity neuro exam: unremarkable  MSK Exam: She is able to heel toe walk without difficulty.  Bilateral thumb CMC arthrosis with pain over the [facility] joint.  Multiple trigger points.  Cervical sidebending and rotation limited with negative Spurling's compression test and Lhermitte's compression test.   ASSESSMENT & PLAN:   1. Neck pain   2. Myofascial pain   3. Anxiety and depression   4. Pain of left upper extremity   5. Primary osteoarthritis of first carpometacarpal joint of right hand   6. Somatic dysfunction of upper extremity   7. Somatic dysfunction of cervical region   8. Somatic dysfunction of thoracic region   9. Somatic dysfunction of lumbar region   10. Somatic dysfunction of rib cage  region   11. Somatic dysfunction of pelvis region   12. Somatic dysfunction of sacral region     PLAN: Osteopathic manipulation was performed today based on physical exam findings.  Please see procedure note for further information including Osteopathic Exam findings  Continue with HEP.  Titration of Cymbalta.  Follow-up: Return in about 1 month (around 02/05/2018) for consideration of repeat osteopathic manipulation.        Please see additional documentation for Objective, Assessment and Plan sections. Pertinent additional documentation may be included in corresponding procedure notes, imaging studies, problem based documentation and patient instructions. Please see these sections of the encounter for additional information regarding this visit.  CMA/ATC served as Education administrator during this visit. History, Physical, and Plan performed by medical provider. Documentation and orders reviewed and attested to.      Gerda Diss, [facility] Physician

## 2018-01-15 ENCOUNTER — Encounter: Payer: Self-pay | Admitting: Sports Medicine

## 2018-01-18 ENCOUNTER — Other Ambulatory Visit: Payer: Self-pay | Admitting: Family Medicine

## 2018-01-18 DIAGNOSIS — Z7184 Encounter for health counseling related to travel: Secondary | ICD-10-CM

## 2018-01-22 NOTE — Telephone Encounter (Signed)
Please advise on refills.  

## 2018-01-23 MED ORDER — ONDANSETRON HCL 4 MG PO TABS: 4.0000 mg | ORAL_TABLET | Freq: Three times a day (TID) | ORAL | 0 refills | Status: DC | PRN

## 2018-01-23 MED ORDER — BUTALBITAL-APAP-CAFFEINE 50-325-40 MG PO TABS: 1.0000 | ORAL_TABLET | Freq: Four times a day (QID) | ORAL | 0 refills | Status: DC | PRN

## 2018-01-25 ENCOUNTER — Telehealth: Payer: Self-pay | Admitting: *Deleted

## 2018-01-25 ENCOUNTER — Ambulatory Visit: Payer: Self-pay | Admitting: *Deleted

## 2018-01-25 ENCOUNTER — Encounter: Payer: Self-pay | Admitting: Physician Assistant

## 2018-01-25 ENCOUNTER — Ambulatory Visit: Payer: No Typology Code available for payment source | Admitting: Physician Assistant

## 2018-01-25 ENCOUNTER — Encounter: Payer: Self-pay | Admitting: Family Medicine

## 2018-01-25 VITALS — BP 118/80 | HR 75 | Temp 98.4°F | Ht 66.0 in | Wt 167.8 lb

## 2018-01-25 DIAGNOSIS — M542 Cervicalgia: Secondary | ICD-10-CM | POA: Diagnosis not present

## 2018-01-25 MED ORDER — METHYLPREDNISOLONE ACETATE 40 MG/ML IJ SUSP
40.0000 mg | Freq: Once | INTRAMUSCULAR | Status: AC
  Administered 2018-01-25: 40 mg via INTRAMUSCULAR

## 2018-01-25 MED ORDER — KETOROLAC TROMETHAMINE 60 MG/2ML IM SOLN
60.0000 mg | Freq: Once | INTRAMUSCULAR | Status: AC
  Administered 2018-01-25: 30 mg via INTRAMUSCULAR

## 2018-01-25 NOTE — Telephone Encounter (Signed)
Pt called back, asked her how she was feeling? Pt said okay. Told pt Aldona Bar discussed the Toradol with Dr. Jerline Pain and he said she will be fine due to we only gave half dose of the Toradol and we gave you prednisone the Depo medrol which is what you would get if you had a reaction. Also you can take a Benadryl. Pt verbalized understanding and said she already took a Benadryl. Asked pt if she is having and SOB? Pt said no. Asked Pt if her pain is any better? Pt said a little bit. Told her okay hope she feels better.

## 2018-01-25 NOTE — Progress Notes (Signed)
[name] [name] is a 37 y.o. female here for a follow up of a pre-existing problem.  I acted as a Education administrator for [employer], PA-C Anselmo Pickler, LPN  History of Present Illness:   Chief Complaint  Patient presents with  . Neck Pain    x 1 wk, headaches    Neck Pain   This is a chronic problem. Episode onset: Pt said this is not her usual pain, started a week ago. The problem occurs constantly. The problem has been gradually worsening. The pain is associated with nothing. The pain is present in the left side (Neck radiating to head). The quality of the pain is described as shooting. The pain is at a severity of 6/10. The pain is moderate. The symptoms are aggravated by position, bending and twisting. The pain is same all the time. Stiffness is present in the morning. Associated symptoms include headaches, numbness and tingling. Pertinent negatives include no fever. She has tried heat, ice and muscle relaxants (Lidocaine patch, Bio Freeze) for the symptoms. The treatment provided no relief.   She has seen Dr. Teresa Coombs in the past for neck pain but states that this pain is different. She last saw Dr. Paulla Fore on 01/08/18 for neck pain.  Past Medical History:  Diagnosis Date  . ADHD (attention deficit hyperactivity disorder)   . Anxiety   . Depression   . Gestational diabetes   . History of chicken pox   . History of condyloma acuminatum   . Migraines      Social History   Socioeconomic History  . Marital status: Married    Spouse name: Not on file  . Number of children: 2  . Years of education: Not on file  . Highest education level: Not on file  Occupational History  . Occupation: Doesn't work  Scientific laboratory technician  . Financial resource strain: Not on file  . Food insecurity:    Worry: Not on file    Inability: Not on file  . Transportation needs:    Medical: Not on file    Non-medical: Not on file  Tobacco Use  . Smoking status: Current Some Day Smoker  . Smokeless tobacco:  Never Used  . Tobacco comment: smokes socially (1 cigarette a week)  Substance and Sexual Activity  . Alcohol use: No    Alcohol/week: 0.0 oz  . Drug use: No  . Sexual activity: Yes  Lifestyle  . Physical activity:    Days per week: Not on file    Minutes per session: Not on file  . Stress: Not on file  Relationships  . Social connections:    Talks on phone: Not on file    Gets together: Not on file    Attends religious service: Not on file    Active member of club or organization: Not on file    Attends meetings of clubs or organizations: Not on file    Relationship status: Not on file  . Intimate partner violence:    Fear of current or ex partner: Not on file    Emotionally abused: Not on file    Physically abused: Not on file    Forced sexual activity: Not on file  Other Topics Concern  . Not on file  Social History Narrative   2012- daughter   2016- son   Married   Does not work.  She was previously a Emergency planning/management officer, last worked in 2012.    Enjoys reading, shopping, beach  Past Surgical History:  Procedure Laterality Date  . CESAREAN SECTION  06/25/2011  . CESAREAN SECTION N/A 05/04/2015  . CHOLECYSTECTOMY N/A 07/07/2015  . ERCP N/A 08/24/2015  . KNEE CARTILAGE SURGERY Left     Family History  Problem Relation Age of Onset  . Hypertension Mother   . Migraines Mother   . Hypertension Father   . Migraines Sister   . Heart disease Maternal Grandmother   . Heart disease Maternal Grandfather   . Diabetes Paternal Grandmother   . Cancer Paternal Grandfather   . Healthy Son   . Healthy Daughter   . Anesthesia problems Neg Hx   . Hypotension Neg Hx   . Malignant hyperthermia Neg Hx   . Pseudochol deficiency Neg Hx     Allergies  Allergen Reactions  . Ibuprofen Hives and Other (See Comments)    stuffiness  . Toradol [Ketorolac Tromethamine] Hives  . Food     Nuts and Yeast  . Latex Hives  . Lovenox [Enoxaparin Sodium] Other (See Comments)    Face  flush and redness  . Nsaids Hives  . Other Hives    CATS  . Penicillins Hives  . Yellow Dyes (Non-Tartrazine) Hives    YELLOW No. 5    Current Medications:   Current Outpatient Medications:  .  ALPRAZolam (XANAX) 0.5 MG tablet, Take 0.5 mg by mouth 4 (four) times daily as needed for anxiety. , Disp: , Rfl: 5 .  amphetamine-dextroamphetamine (ADDERALL) 20 MG tablet, Take 20 mg by mouth 3 (three) times daily., Disp: , Rfl:  .  butalbital-acetaminophen-caffeine (FIORICET, ESGIC) 50-325-40 MG tablet, Take 1 tablet by mouth every 6 (six) hours as needed for headache., Disp: 20 tablet, Rfl: 0 .  cetirizine (ZYRTEC) 10 MG tablet, Take 10 mg by mouth daily., Disp: , Rfl:  .  clotrimazole-betamethasone (LOTRISONE) lotion, Apply topically 2 (two) times daily., Disp: 30 mL, Rfl: 0 .  cyclobenzaprine (FLEXERIL) 5 MG tablet, Take 1 tablet (5 mg total) by mouth at bedtime., Disp: 30 tablet, Rfl: 0 .  DULoxetine (CYMBALTA) 30 MG capsule, Take 1 capsule (30 mg total) by mouth daily., Disp: 30 capsule, Rfl: 2 .  MICROGESTIN FE 1/20 1-20 MG-MCG tablet, Take 1 tablet by mouth daily., Disp: , Rfl: 11 .  ondansetron (ZOFRAN) 4 MG tablet, Take 1 tablet (4 mg total) by mouth every 8 (eight) hours as needed for nausea or vomiting., Disp: 20 tablet, Rfl: 0 .  pregabalin (LYRICA) 75 MG capsule, Take 1 capsule (75 mg total) by mouth 2 (two) times daily., Disp: 60 capsule, Rfl: 2 .  ranitidine (ZANTAC) 150 MG tablet, Take 150 mg by mouth 2 (two) times daily., Disp: , Rfl:    Review of Systems:   Review of Systems  Constitutional: Negative for fever.  Musculoskeletal: Positive for neck pain.  Neurological: Positive for tingling, numbness and headaches.    Vitals:   Vitals:   01/25/18 1439  BP: 118/80  Pulse: 75  Temp: 98.4 F (36.9 C)  TempSrc: Oral  SpO2: 98%  Weight: 167 lb 12.8 oz (76.1 kg)  Height: 5\' 6"  (1.676 m)     Body mass index is 27.08 kg/m.  Physical Exam:   Physical Exam   Constitutional: She appears well-developed. She is cooperative.  Non-toxic appearance. She does not have a sickly appearance. She does not appear ill. No distress.  HENT:  Head: Normocephalic and atraumatic.  Cardiovascular: Normal rate, regular rhythm, S1 normal, S2 normal, normal heart sounds and normal  pulses.  No LE edema  Pulmonary/Chest: Effort normal and breath sounds normal.  Musculoskeletal:  Tenderness with palpation to L paraspinal cervical muscle -- small spasm appreciated. Mild decrease in range of motion with rotation of neck and extension and flexion.  No bony tenderness.  Neurological: She is alert. She has normal strength. No cranial nerve deficit or sensory deficit. She displays a negative Romberg sign. GCS eye subscore is 4. GCS verbal subscore is 5. GCS motor subscore is 6.  Grip strength 5/5 bilaterally.  Skin: Skin is warm, dry and intact.  Psychiatric: She has a normal mood and affect. Her speech is normal and behavior is normal.  Nursing note and vitals reviewed.   Assessment and Plan:    Klarissa was seen today for neck pain.  Diagnoses and all orders for this visit:  Neck pain -     ketorolac (TORADOL) injection 60 mg -     methylPREDNISolone acetate (DEPO-MEDROL) injection 40 mg   No red flags on my exam. Suspect cervical strain. Discussed treatment options with patient. We decided on a steroid injection and an injection for pain, which I described as an "anti-inflammatory" and she verbalized understanding and was agreeable to both injections. I gave a verbal order to nursing staff to treat with 40 mg depomedrol injection and 30 mg Toradol injection. Patient received both and tolerated well without side effects in our office. Prior to patient leaving office, it was identified that patient has an allergy to ibuprofen, with a reaction of "hives." Patient was stable when she left the office and was told to take Benadryl to help prevent any further reaction. Patient  was called about 15-30 minutes after leaving our office, stated that she was doing well without any major adverse effects. She was instructed that if she developed any SOB, lip swelling, or difficulty breathing to immediately seek medical attention. Patient verbalized understanding.  . Reviewed expectations re: course of current medical issues. . Discussed self-management of symptoms. . Outlined signs and symptoms indicating need for more acute intervention. . Patient verbalized understanding and all questions were answered. . See orders for this visit as documented in the electronic medical record. . Patient received an After-Visit Summary.  CMA or LPN served as scribe during this visit. History, Physical, and Plan performed by medical provider. Documentation and orders reviewed and attested to.   Inda Coke, PA-C

## 2018-01-25 NOTE — Telephone Encounter (Signed)
Samantha requested [place] Controlled Substance be checked. Pt has an appt today.  Controlled substance: Adderall 20 mg, Alprazolam 0.5 mg, Lyrica 75 mg  Pharmacy: Adderall # 120 tablets filled at [facility] [address], [place], Alprazolam #120 tablets [facility] on [address], [place], Lyrica # 60 capsules [facility] at [facility] on [address], [place].  Status: Last filled 01/10/2018 Lyrica ,  01/23/2018 Adderall and Alprazolam  New Findings: Pt goes to 3 different pharmacys   Additional Comments: Samantha notified of findings.

## 2018-01-25 NOTE — Telephone Encounter (Signed)
FYI

## 2018-01-25 NOTE — Telephone Encounter (Signed)
  Pt reports left sided neck pain x 1 week. States "Shooting pain 10/10 from neck to behind left ear." Also states when turns head to left, pain radiates up, left side of head. Reports constant "dull" headache 5/10. Denies fever. Has tried multiple OTC topical meds, ineffective. States "My lymph nodes on the left side may be swollen."  Denies any dental pain. Denies numbness,tingling arms; "No more than usual."  Seen 01/08/18 by Dr. Paulla Fore for similar symptoms. States H/O fibromyalgia. Appt made for today with S. Worley. Care advise given per protocol. Reason for Disposition . [1] MODERATE neck pain (e.g., interferes with normal activities AND [2] present > 3 days  Answer Assessment - Initial Assessment Questions 1. ONSET: "When did the pain begin?"      1 week ago 2. LOCATION: "Where does it hurt?"      Left side of neck, "shoots" behind left ear 3. PATTERN "Does the pain come and go, or has it been constant since it started?"      "Dull" headache constant 5/10; shooting pain behind lt ear, neck 10/10 when occurs 4. SEVERITY: "How bad is the pain?"  (Scale 1-10; or mild, moderate, severe)   - MILD (1-3): doesn't interfere with normal activities    - MODERATE (4-7): interferes with normal activities or awakens from sleep    - SEVERE (8-10):  excruciating pain, unable to do any normal activities      Varies 5. RADIATION: "Does the pain go anywhere else, shoot into your arms?"     Left side of head, ear 6. CORD SYMPTOMS: "Any weakness or numbness of the arms or legs?"     no 7. CAUSE: "What do you think is causing the neck pain?"     unsure 8. NECK OVERUSE: "Any recent activities that involved turning or twisting the neck?"     no 9. OTHER SYMPTOMS: "Do you have any other symptoms?" (e.g., headache, fever, chest pain, difficulty breathing, neck swelling)     "I think my lymph nodes may be swollen on the left side" 10. PREGNANCY: "Is there any chance you are pregnant?" "When was your last  menstrual period?"       no  Protocols used: NECK PAIN OR STIFFNESS-A-AH

## 2018-01-25 NOTE — Patient Instructions (Signed)
It was great to see you!  If symptoms worsen over the weekend, please seek medical attention.  If symptoms do not improve by Monday, please make an appointment with Dr. Teresa Coombs.

## 2018-01-25 NOTE — Telephone Encounter (Signed)
Left message on voicemail to call office.  

## 2018-01-28 ENCOUNTER — Ambulatory Visit (INDEPENDENT_AMBULATORY_CARE_PROVIDER_SITE_OTHER): Payer: No Typology Code available for payment source | Admitting: Family Medicine

## 2018-01-28 ENCOUNTER — Encounter: Payer: Self-pay | Admitting: Physician Assistant

## 2018-01-28 ENCOUNTER — Encounter: Payer: Self-pay | Admitting: Family Medicine

## 2018-01-28 VITALS — BP 120/82 | HR 82 | Temp 98.2°F | Ht 66.0 in | Wt 171.6 lb

## 2018-01-28 DIAGNOSIS — M542 Cervicalgia: Secondary | ICD-10-CM

## 2018-01-28 MED ORDER — DIAZEPAM 5 MG PO TABS: 5.0000 mg | ORAL_TABLET | Freq: Four times a day (QID) | ORAL | 0 refills | Status: DC | PRN

## 2018-01-28 MED ORDER — OXYCODONE-ACETAMINOPHEN 5-325 MG PO TABS: 1.0000 | ORAL_TABLET | Freq: Four times a day (QID) | ORAL | 0 refills | Status: DC | PRN

## 2018-01-28 NOTE — Progress Notes (Signed)
[name] [name] is a 37 y.o. female is here for follow up.  History of Present Illness:   HPI: Left posterior neck pain, focal, with resulting headache that includes 5/10 pain, photophobia, some nausea. No injury. Seen by Aldona Bar last Friday. Treated with Toradol, Depomedrol. Pain improved. Hives from Toradol controlled with Benadryl. Toradol did help with pain control on Friday only. No new symptoms, but feeling very tired - unable to sleep due to pain.  There are no preventive care reminders to display for this patient.   Depression screen [facility] 2/9 10/02/2017 02/16/2017 12/07/2014  Decreased Interest 0 0 0  Down, Depressed, Hopeless 0 0 0  PHQ - 2 Score 0 0 0  Altered sleeping 0 0 -  Tired, decreased energy 0 0 -  Change in appetite 0 0 -  Feeling bad or failure about yourself  0 1 -  Trouble concentrating 0 0 -  Moving slowly or fidgety/restless 0 0 -  Suicidal thoughts 0 0 -  PHQ-9 Score 0 1 -  Difficult doing work/chores Not difficult at all - -   PMHx, SurgHx, SocialHx, FamHx, Medications, and Allergies were reviewed in the Visit Navigator and updated as appropriate.   Patient Active Problem List   Diagnosis Date Noted  . Myofascial pain 09/04/2017  . Bilateral carpal tunnel syndrome 09/04/2017  . Ankle impingement syndrome, left 09/04/2017  . Migraine with aura and without status migrainosus, not intractable 09/06/2015  . Anxiety and depression 11/05/2009  . Attention deficit disorder 11/05/2009   Social History   Tobacco Use  . Smoking status: Current Some Day Smoker  . Smokeless tobacco: Never Used  . Tobacco comment: smokes socially (1 cigarette a week)  Substance Use Topics  . Alcohol use: No    Alcohol/week: 0.0 oz  . Drug use: No   Current Medications and Allergies:   Current Outpatient Medications:  .  ALPRAZolam (XANAX) 0.5 MG tablet, Take 0.5 mg by mouth 4 (four) times daily as needed for anxiety. , Disp: , Rfl: 5 .  amphetamine-dextroamphetamine  (ADDERALL) 20 MG tablet, Take 20 mg by mouth 3 (three) times daily., Disp: , Rfl:  .  butalbital-acetaminophen-caffeine (FIORICET, ESGIC) 50-325-40 MG tablet, Take 1 tablet by mouth every 6 (six) hours as needed for headache., Disp: 20 tablet, Rfl: 0 .  cetirizine (ZYRTEC) 10 MG tablet, Take 10 mg by mouth daily., Disp: , Rfl:  .  cyclobenzaprine (FLEXERIL) 5 MG tablet, Take 1 tablet (5 mg total) by mouth at bedtime., Disp: 30 tablet, Rfl: 0 .  DULoxetine (CYMBALTA) 30 MG capsule, Take 1 capsule (30 mg total) by mouth daily., Disp: 30 capsule, Rfl: 2 .  MICROGESTIN FE 1/20 1-20 MG-MCG tablet, Take 1 tablet by mouth daily., Disp: , Rfl: 11 .  ondansetron (ZOFRAN) 4 MG tablet, Take 1 tablet (4 mg total) by mouth every 8 (eight) hours as needed for nausea or vomiting., Disp: 20 tablet, Rfl: 0 .  pregabalin (LYRICA) 75 MG capsule, Take 1 capsule (75 mg total) by mouth 2 (two) times daily., Disp: 60 capsule, Rfl: 2 .  ranitidine (ZANTAC) 150 MG tablet, Take 150 mg by mouth 2 (two) times daily., Disp: , Rfl:    Allergies  Allergen Reactions  . Ibuprofen Hives and Other (See Comments)    stuffiness  . Toradol [Ketorolac Tromethamine] Hives  . Food     Nuts and Yeast  . Latex Hives  . Lovenox [Enoxaparin Sodium] Other (See Comments)    Face flush  and redness  . Nsaids Hives  . Other Hives    CATS  . Penicillins Hives  . Yellow Dyes (Non-Tartrazine) Hives    YELLOW No. 5   Review of Systems   Pertinent items are noted in the HPI. Otherwise, ROS is negative.  Vitals:   Vitals:   01/28/18 1523  BP: 120/82  Pulse: 82  Temp: 98.2 F (36.8 C)  TempSrc: Oral  SpO2: 97%  Weight: 171 lb 9.6 oz (77.8 kg)  Height: 5\' 6"  (1.676 m)     Body mass index is 27.7 kg/m.  Physical Exam:   Physical Exam  Constitutional: She is oriented to person, place, and time. She appears well-developed and well-nourished. No distress.  HENT:  Head: Normocephalic and atraumatic.  Right Ear: External ear  normal.  Left Ear: External ear normal.  Nose: Nose normal.  Mouth/Throat: Oropharynx is clear and moist.  Eyes: Pupils are equal, round, and reactive to light. Conjunctivae and EOM are normal.  Neck: Normal range of motion. Neck supple. No thyromegaly present.  Cardiovascular: Normal rate, regular rhythm, normal heart sounds and intact distal pulses.  Pulmonary/Chest: Effort normal and breath sounds normal.  Abdominal: Soft. Bowel sounds are normal.  Musculoskeletal:       Cervical back: She exhibits decreased range of motion and spasm.  Lymphadenopathy:    She has no cervical adenopathy.  Neurological: She is alert and oriented to person, place, and time.  Skin: Skin is warm and dry. Capillary refill takes less than 2 seconds.  Psychiatric: She has a normal mood and affect. Her behavior is normal.  Nursing note and vitals reviewed.    Assessment and Plan:   Mikenzi was seen today for follow-up.  Diagnoses and all orders for this visit:  Cervicalgia -     diazepam (VALIUM) 5 MG tablet; Take 1 tablet (5 mg total) by mouth every 6 (six) hours as needed for anxiety. -     oxyCODONE-acetaminophen (PERCOCET/ROXICET) 5-325 MG tablet; Take 1 tablet by mouth every 6 (six) hours as needed for severe pain. -     Ambulatory referral to Physical Therapy   . Reviewed expectations re: course of current medical issues. . Discussed self-management of symptoms. . Outlined signs and symptoms indicating need for more acute intervention. . Patient verbalized understanding and all questions were answered. Marland Kitchen Health Maintenance issues including appropriate healthy diet, exercise, and smoking avoidance were discussed with patient. . See orders for this visit as documented in the electronic medical record. . Patient received an After Visit Summary.  Briscoe Deutscher, DO [place], [place] 01/29/2018  Future Appointments  Date Time Provider [place]  02/05/2018  9:40 AM Gerda Diss,  DO [facility]  02/05/2018 10:15 AM [facility] SUB THERAPIST A [facility]  04/01/2018  9:00 AM Briscoe Deutscher, DO [facility]

## 2018-01-28 NOTE — Telephone Encounter (Signed)
Chart was updated by [facility].

## 2018-01-28 NOTE — Progress Notes (Deleted)
[name] [name] is a 37 y.o. female here for an acute visit.  History of Present Illness:   [name], CMA acting as scribe for Dr. Briscoe Deutscher.    HPI: Patient states she is in for follow up from hives from medications given last week. Pain got better for one day. Hives cleared up yesterday.  When entering orders allergy for yellow die with reaction came up for Valium. Discussed with patient she has taken in the past with no problems.  PMHx, SurgHx, SocialHx, Medications, and Allergies were reviewed in the Visit Navigator and updated as appropriate.  Current Medications:   Current Outpatient Medications:  .  ALPRAZolam (XANAX) 0.5 MG tablet, Take 0.5 mg by mouth 4 (four) times daily as needed for anxiety. , Disp: , Rfl: 5 .  amphetamine-dextroamphetamine (ADDERALL) 20 MG tablet, Take 20 mg by mouth 3 (three) times daily., Disp: , Rfl:  .  butalbital-acetaminophen-caffeine (FIORICET, ESGIC) 50-325-40 MG tablet, Take 1 tablet by mouth every 6 (six) hours as needed for headache., Disp: 20 tablet, Rfl: 0 .  cetirizine (ZYRTEC) 10 MG tablet, Take 10 mg by mouth daily., Disp: , Rfl:  .  clotrimazole-betamethasone (LOTRISONE) lotion, Apply topically 2 (two) times daily., Disp: 30 mL, Rfl: 0 .  cyclobenzaprine (FLEXERIL) 5 MG tablet, Take 1 tablet (5 mg total) by mouth at bedtime., Disp: 30 tablet, Rfl: 0 .  DULoxetine (CYMBALTA) 30 MG capsule, Take 1 capsule (30 mg total) by mouth daily., Disp: 30 capsule, Rfl: 2 .  MICROGESTIN FE 1/20 1-20 MG-MCG tablet, Take 1 tablet by mouth daily., Disp: , Rfl: 11 .  ondansetron (ZOFRAN) 4 MG tablet, Take 1 tablet (4 mg total) by mouth every 8 (eight) hours as needed for nausea or vomiting., Disp: 20 tablet, Rfl: 0 .  pregabalin (LYRICA) 75 MG capsule, Take 1 capsule (75 mg total) by mouth 2 (two) times daily., Disp: 60 capsule, Rfl: 2 .  ranitidine (ZANTAC) 150 MG tablet, Take 150 mg by mouth 2 (two) times daily., Disp: , Rfl:    Allergies    Allergen Reactions  . Ibuprofen Hives and Other (See Comments)    stuffiness  . Toradol [Ketorolac Tromethamine] Hives  . Food     Nuts and Yeast  . Latex Hives  . Lovenox [Enoxaparin Sodium] Other (See Comments)    Face flush and redness  . Nsaids Hives  . Other Hives    CATS  . Penicillins Hives  . Yellow Dyes (Non-Tartrazine) Hives    YELLOW No. 5   Review of Systems:   Pertinent items are noted in the HPI. Otherwise, ROS is negative.  Vitals:  There were no vitals filed for this visit.   There is no height or weight on file to calculate BMI.  Physical Exam:   Physical Exam Results for orders placed or performed in visit on 08/22/17  HM PAP SMEAR  Result Value Ref Range   HM Pap smear Negative with Negative HPV     Assessment and Plan:   There are no diagnoses linked to this encounter.  . Reviewed expectations re: course of current medical issues. . Discussed self-management of symptoms. . Outlined signs and symptoms indicating need for more acute intervention. . Patient verbalized understanding and all questions were answered. Marland Kitchen Health Maintenance issues including appropriate healthy diet, exercise, and smoking avoidance were discussed with patient. . See orders for this visit as documented in the electronic medical record. . Patient received an After Visit Summary.  CMA served as Education administrator during this visit. History, Physical, and Plan performed by medical provider. The above documentation has been reviewed and is accurate and complete. Briscoe Deutscher, [place], [place], [place] 01/28/2018  ***Did you take ownership of the note?

## 2018-02-05 ENCOUNTER — Ambulatory Visit: Payer: No Typology Code available for payment source

## 2018-02-05 ENCOUNTER — Ambulatory Visit: Payer: No Typology Code available for payment source | Admitting: Sports Medicine

## 2018-02-08 ENCOUNTER — Encounter: Payer: Self-pay | Admitting: Sports Medicine

## 2018-02-08 ENCOUNTER — Other Ambulatory Visit: Payer: Self-pay | Admitting: Sports Medicine

## 2018-02-11 NOTE — Telephone Encounter (Signed)
Last OV 01/28/2018, last refill 11/27/17 #60/2 refills. Pt runs out of medication today (per MyChart message).

## 2018-02-14 ENCOUNTER — Ambulatory Visit: Payer: No Typology Code available for payment source | Admitting: Sports Medicine

## 2018-02-16 ENCOUNTER — Other Ambulatory Visit: Payer: Self-pay | Admitting: Family Medicine

## 2018-02-16 DIAGNOSIS — Z7184 Encounter for health counseling related to travel: Secondary | ICD-10-CM

## 2018-02-18 ENCOUNTER — Encounter: Payer: Self-pay | Admitting: Family Medicine

## 2018-02-18 MED ORDER — ONDANSETRON HCL 4 MG PO TABS: 4.0000 mg | ORAL_TABLET | Freq: Three times a day (TID) | ORAL | 0 refills | Status: DC | PRN

## 2018-02-18 MED ORDER — BUTALBITAL-APAP-CAFFEINE 50-325-40 MG PO TABS: 1.0000 | ORAL_TABLET | Freq: Four times a day (QID) | ORAL | 0 refills | Status: DC | PRN

## 2018-02-18 NOTE — Telephone Encounter (Signed)
Ok to refill 

## 2018-02-19 ENCOUNTER — Other Ambulatory Visit: Payer: Self-pay | Admitting: Family Medicine

## 2018-02-19 ENCOUNTER — Ambulatory Visit: Payer: No Typology Code available for payment source | Admitting: Physical Therapy

## 2018-02-19 ENCOUNTER — Other Ambulatory Visit: Payer: Self-pay | Admitting: Surgical

## 2018-02-19 DIAGNOSIS — M7918 Myalgia, other site: Secondary | ICD-10-CM

## 2018-02-19 DIAGNOSIS — M542 Cervicalgia: Secondary | ICD-10-CM

## 2018-02-19 MED ORDER — CYCLOBENZAPRINE HCL 5 MG PO TABS: 5.0000 mg | ORAL_TABLET | Freq: Every day | ORAL | 0 refills | Status: DC | PRN

## 2018-02-20 ENCOUNTER — Ambulatory Visit: Payer: No Typology Code available for payment source | Admitting: Sports Medicine

## 2018-03-23 ENCOUNTER — Other Ambulatory Visit: Payer: Self-pay | Admitting: Family Medicine

## 2018-03-23 DIAGNOSIS — M542 Cervicalgia: Secondary | ICD-10-CM

## 2018-03-25 ENCOUNTER — Other Ambulatory Visit: Payer: Self-pay | Admitting: Family Medicine

## 2018-03-25 DIAGNOSIS — Z7184 Encounter for health counseling related to travel: Secondary | ICD-10-CM

## 2018-03-25 DIAGNOSIS — M542 Cervicalgia: Secondary | ICD-10-CM

## 2018-03-25 NOTE — Telephone Encounter (Signed)
Last o/v 01/28/18 F/u 04/01/18 Last script 02/19/18 #20 no rf

## 2018-03-26 MED ORDER — BUTALBITAL-APAP-CAFFEINE 50-325-40 MG PO TABS: 1.0000 | ORAL_TABLET | Freq: Four times a day (QID) | ORAL | 0 refills | Status: DC | PRN

## 2018-03-26 MED ORDER — CYCLOBENZAPRINE HCL 5 MG PO TABS: 5.0000 mg | ORAL_TABLET | Freq: Every day | ORAL | 0 refills | Status: DC | PRN

## 2018-03-26 MED ORDER — ONDANSETRON HCL 4 MG PO TABS: 4.0000 mg | ORAL_TABLET | Freq: Three times a day (TID) | ORAL | 0 refills | Status: DC | PRN

## 2018-03-26 NOTE — Telephone Encounter (Signed)
Please advise on refills.  LOV 01/28/18

## 2018-03-31 NOTE — Progress Notes (Signed)
[name] [name] is a 37 y.o. female is here for follow up.  History of Present Illness:   [name] [name], CMA acting as scribe for Dr. Briscoe Deutscher.   HPI: Patient in today for follow up on neck pain. She was scheduled to start PT in our office by had to cancelled due to death in family. She will reschedule that today. Pain 7/10 with some movements. She also stopped lyrica last month. She did start back on it two days ago.    Health Maintenance Due  Topic Date Due  . INFLUENZA VACCINE  03/28/2018   Depression screen [facility] 2/9 04/01/2018 10/02/2017 02/16/2017  Decreased Interest 0 0 0  Down, Depressed, Hopeless 0 0 0  PHQ - 2 Score 0 0 0  Altered sleeping 3 0 0  Tired, decreased energy 0 0 0  Change in appetite 0 0 0  Feeling bad or failure about yourself  0 0 1  Trouble concentrating 3 0 0  Moving slowly or fidgety/restless 0 0 0  Suicidal thoughts 0 0 0  PHQ-9 Score 6 0 1  Difficult doing work/chores Not difficult at all Not difficult at all -   PMHx, SurgHx, SocialHx, FamHx, Medications, and Allergies were reviewed in the Visit Navigator and updated as appropriate.   Patient Active Problem List   Diagnosis Date Noted  . Myofascial pain 09/04/2017  . Bilateral carpal tunnel syndrome 09/04/2017  . Ankle impingement syndrome, left 09/04/2017  . Migraine with aura and without status migrainosus, not intractable 09/06/2015  . Anxiety and depression 11/05/2009  . Attention deficit disorder 11/05/2009   Social History   Tobacco Use  . Smoking status: Current Some Day Smoker  . Smokeless tobacco: Never Used  . Tobacco comment: smokes socially (1 cigarette a week)  Substance Use Topics  . Alcohol use: No    Alcohol/week: 0.0 oz  . Drug use: No   Current Medications and Allergies:   Current Outpatient Medications:  .  ALPRAZolam (XANAX) 0.5 MG tablet, Take 0.5 mg by mouth 4 (four) times daily as needed for anxiety. , Disp: , Rfl: 5 .  amphetamine-dextroamphetamine  (ADDERALL) 20 MG tablet, Take 20 mg by mouth 3 (three) times daily., Disp: , Rfl:  .  butalbital-acetaminophen-caffeine (FIORICET, ESGIC) 50-325-40 MG tablet, Take 1 tablet by mouth every 6 (six) hours as needed for headache., Disp: 20 tablet, Rfl: 0 .  cetirizine (ZYRTEC) 10 MG tablet, Take 10 mg by mouth daily., Disp: , Rfl:  .  cyclobenzaprine (FLEXERIL) 5 MG tablet, Take 1 tablet (5 mg total) by mouth daily as needed for muscle spasms., Disp: 20 tablet, Rfl: 0 .  DULoxetine (CYMBALTA) 30 MG capsule, Take 1 capsule (30 mg total) by mouth daily., Disp: 30 capsule, Rfl: 2 .  MICROGESTIN FE 1/20 1-20 MG-MCG tablet, Take 1 tablet by mouth daily., Disp: , Rfl: 11 .  ondansetron (ZOFRAN) 4 MG tablet, Take 1 tablet (4 mg total) by mouth every 8 (eight) hours as needed for nausea or vomiting., Disp: 20 tablet, Rfl: 0 .  ranitidine (ZANTAC) 150 MG tablet, Take 150 mg by mouth 2 (two) times daily., Disp: , Rfl:  .  LYRICA 75 MG capsule, TAKE 1 CAPSULE BY MOUTH TWICE A DAY (Patient not taking: Reported on 04/01/2018), Disp: 60 capsule, Rfl: 3   Allergies  Allergen Reactions  . Ibuprofen Hives and Other (See Comments)    stuffiness  . Toradol [Ketorolac Tromethamine] Hives  . Food     Nuts  and Yeast  . Latex Hives  . Lovenox [Enoxaparin Sodium] Other (See Comments)    Face flush and redness  . Nsaids Hives  . Other Hives    CATS  . Penicillins Hives  . Yellow Dyes (Non-Tartrazine) Hives    YELLOW No. 5   Review of Systems   Pertinent items are noted in the HPI. Otherwise, ROS is negative.  Vitals:   Vitals:   04/01/18 0855  BP: 130/78  Pulse: 83  Temp: 98.5 F (36.9 C)  TempSrc: Oral  SpO2: 97%  Weight: 171 lb 6.4 oz (77.7 kg)  Height: 5\' 6"  (1.676 m)     Body mass index is 27.66 kg/m.  Physical Exam:   Physical Exam  Constitutional: She appears well-nourished.  HENT:  Head: Normocephalic and atraumatic.  Eyes: Pupils are equal, round, and reactive to light. EOM are normal.   Neck: Normal range of motion. Neck supple.  Cardiovascular: Normal rate, regular rhythm, normal heart sounds and intact distal pulses.  Pulmonary/Chest: Effort normal.  Abdominal: Soft.  Musculoskeletal:       Cervical back: She exhibits decreased range of motion, bony tenderness and spasm.  UE weakness bilaterally.  Skin: Skin is warm.  Psychiatric: She has a normal mood and affect. Her behavior is normal.  Nursing note and vitals reviewed.  Assessment and Plan:   [name] [name] was seen today for follow-up.  Diagnoses and all orders for this visit:  Myofascial pain -     Ambulatory referral to Physical Therapy  Cervicalgia -     Ambulatory referral to Physical Therapy -     MR NECK W/CM; Future    . Reviewed expectations re: course of current medical issues. . Discussed self-management of symptoms. . Outlined signs and symptoms indicating need for more acute intervention. . Patient verbalized understanding and all questions were answered. Marland Kitchen Health Maintenance issues including appropriate healthy diet, exercise, and smoking avoidance were discussed with patient. . See orders for this visit as documented in the electronic medical record. . Patient received an After Visit Summary.  Briscoe Deutscher, DO [place], [place] 04/01/2018  Future Appointments  Date Time Provider [place]  04/09/2018 11:00 AM Lyndee Hensen, PT [facility]   CMA served as scribe during this visit. History, Physical, and Plan performed by medical provider. The above documentation has been reviewed and is accurate and complete. Briscoe Deutscher, D.O.

## 2018-04-01 ENCOUNTER — Ambulatory Visit: Payer: No Typology Code available for payment source | Admitting: Family Medicine

## 2018-04-01 ENCOUNTER — Encounter: Payer: Self-pay | Admitting: Family Medicine

## 2018-04-01 VITALS — BP 130/78 | HR 83 | Temp 98.5°F | Ht 66.0 in | Wt 171.4 lb

## 2018-04-01 DIAGNOSIS — M7918 Myalgia, other site: Secondary | ICD-10-CM

## 2018-04-01 DIAGNOSIS — M542 Cervicalgia: Secondary | ICD-10-CM | POA: Diagnosis not present

## 2018-04-09 ENCOUNTER — Ambulatory Visit: Payer: No Typology Code available for payment source | Admitting: Physical Therapy

## 2018-04-09 ENCOUNTER — Encounter: Payer: Self-pay | Admitting: Physical Therapy

## 2018-04-09 ENCOUNTER — Telehealth: Payer: Self-pay | Admitting: Family Medicine

## 2018-04-09 DIAGNOSIS — M542 Cervicalgia: Secondary | ICD-10-CM | POA: Diagnosis not present

## 2018-04-09 DIAGNOSIS — M62830 Muscle spasm of back: Secondary | ICD-10-CM | POA: Diagnosis not present

## 2018-04-09 NOTE — Telephone Encounter (Signed)
Copied from [place] [id number]. Topic: Referral - Status >> Apr 09, 2018  9:02 AM Cecelia Byars, NT wrote: Reason for CRM: Patient  called to follow up on the status of the referral for  the MRI , she is going for PT today and wants to know iwill the mri be scheduled after, that appointment , please advise [phone]

## 2018-04-09 NOTE — Telephone Encounter (Signed)
See note

## 2018-04-09 NOTE — Therapy (Signed)
[facility] [address] [place], [place], [address] Phone: [phone]   Fax:  [phone]  Physical Therapy Evaluation  Patient Details  Name: [name] [name] MRN: [id number] Date of Birth: 05-Apr-1981 Referring Provider: Marita Snellen   Encounter Date: 04/09/2018  PT End of Session - 04/09/18 1147    Visit Number  1    Number of Visits  12    Date for PT Re-Evaluation  05/21/18    Authorization Type  U[employer]    PT Start Time  1102    PT Stop Time  1145    PT Time Calculation (min)  43 min    Activity Tolerance  Patient tolerated treatment well    Behavior During Therapy  [facility] for tasks assessed/performed       Past Medical History:  Diagnosis Date  . ADHD (attention deficit hyperactivity disorder)   . Anxiety   . Depression   . Gestational diabetes   . History of chicken pox   . History of condyloma acuminatum   . Migraines     Past Surgical History:  Procedure Laterality Date  . CESAREAN SECTION  06/25/2011  . CESAREAN SECTION N/A 05/04/2015  . CHOLECYSTECTOMY N/A 07/07/2015  . ERCP N/A 08/24/2015  . KNEE CARTILAGE SURGERY Left     There were no vitals filed for this visit.   Subjective Assessment - 04/09/18 1104    Subjective  Pt states increased pain in neck and shoulders for a couple years, no injury to report. She works for [employer]. She was working out, has not been lately due to foot pain. She has seen Sports Med previously, and has had manipulations that were helpful. She feels that pain has not significantly improved overall.     Limitations  Lifting;Reading;Writing;House hold activities    Patient Stated Goals  Decreased pain     Currently in Pain?  Yes    Pain Score  6     Pain Location  Neck    Pain Orientation  Right;Left    Pain Descriptors / Indicators  Aching;Tightness    Pain Type  Chronic pain    Pain Onset  More than a month ago    Pain Frequency  Intermittent    Aggravating Factors   End  of the day.          [facility] PT Assessment - 04/09/18 0001      Assessment   Medical Diagnosis  Neck Pain    Referring Provider  Marita Snellen    Hand Dominance  Right    Prior Therapy  No      Precautions   Precautions  None      Balance Screen   Has the patient fallen in the past 6 months  No      Prior Function   Level of Independence  Independent      Cognition   Overall Cognitive Status  Within Functional Limits for tasks assessed      ROM / Strength   AROM / PROM / Strength  AROM;Strength      AROM   Overall AROM Comments  Cervical: mild deficit for end range rotation and extension;  Shoulder: WNL (pain at end range)       Strength   Overall Strength Comments  Shoulder: 4/5;  Scapular: 4-/5       Palpation   Palpation comment  Tightness and painful trigger points in Bilateral upper traps, levator, and rhomboids.  Special Tests   Other special tests  + tinels,  Negative cervical radicular symptoms;                 Objective measurements completed on examination: See above findings.      [facility] Adult PT Treatment/Exercise - 04/09/18 0001      Exercises   Exercises  Neck      Neck Exercises: Theraband   Scapula Retraction  20 reps;Red      Neck Exercises: Seated   Neck Retraction  20 reps    Shoulder Rolls  20 reps    Other Seated Exercise  scap squeeze x20;       Neck Exercises: Stretches   Upper Trapezius Stretch  3 reps;30 seconds    Other Neck Stretches  Supine Pec stretch, with wrist flex/ext for nerve glide x20 bil;              PT Education - 04/09/18 1147    Education Details  HEP, PT POC    Person(s) Educated  Patient    Methods  Explanation;Handout    Comprehension  Verbalized understanding;Need further instruction       PT Short Term Goals - 04/09/18 1148      PT SHORT TERM GOAL #1   Title  Pt to be independent with initial HEP     Time  2    Period  Weeks    Status  New    Target Date  04/23/18      PT  SHORT TERM GOAL #2   Title  Pt to report decreased pain in cervical region, to 4/10 with activity     Time  2    Period  Weeks    Status  New    Target Date  04/23/18        PT Long Term Goals - 04/09/18 1149      PT LONG TERM GOAL #1   Title  Pt to report decreased pain in cervical region, to 0-2/10 with activity     Time  6    Period  Weeks    Status  New    Target Date  05/21/18      PT LONG TERM GOAL #2   Title  Pt to demo increased strenght of shoulder and scapular muscles, to at least 4+/5 to improve stability , posture, and pain.     Time  6    Period  Weeks    Status  New    Target Date  05/21/18      PT LONG TERM GOAL #3   Title  Pt to demo full cervical and shoulder ROM, without pain, to improve ability for IADls and work duties.     Time  6    Period  Weeks    Status  New    Target Date  05/21/18      PT LONG TERM GOAL #4   Title  Pt to be independent with final HEP     Time  6    Period  Weeks    Status  New    Target Date  05/21/18             Plan - 04/09/18 1151    Clinical Impression Statement  Pt presents with primary complaint of increased pain in cervical region. She has increased pain in neck, shoulders, and intermittent tingling into bilateral hands. She has significant tightness and active trigger points in bil upper traps, levator,  and rhomboid region. She has only mild loss of cervical ROM, mild loss at end range due to muscle tightness. She has decreased strength of neck, shoulder, and scapular muscles. She has decreased posture, and will benefit from education on postural strengthening for HEP. Pt with lack of effective HEP for her dx.Pt with decreased ability for full functional activities due to pain, and will benefit from skilled PT to improve pain and function.     Clinical Presentation  Stable    Clinical Decision Making  Low    Rehab Potential  Good    PT Frequency  2x / week    PT Duration  6 weeks    PT Treatment/Interventions   ADLs/Self Care Home Management;Electrical Stimulation;Iontophoresis 4mg /ml Dexamethasone;Moist Heat;Cryotherapy;Ultrasound;Functional mobility training;Therapeutic activities;Orthotic Fit/Training;Patient/family education;Neuromuscular re-education;Therapeutic exercise;Manual techniques;Passive range of motion;Dry needling;Taping    Consulted and Agree with Plan of Care  Patient       Patient will benefit from skilled therapeutic intervention in order to improve the following deficits and impairments:  Decreased endurance, Decreased activity tolerance, Decreased strength, Pain, Increased muscle spasms, Decreased range of motion, Improper body mechanics  Visit Diagnosis: Neck pain  Muscle spasm of back     Problem List Patient Active Problem List   Diagnosis Date Noted  . Myofascial pain 09/04/2017  . Bilateral carpal tunnel syndrome 09/04/2017  . Ankle impingement syndrome, left 09/04/2017  . Migraine with aura and without status migrainosus, not intractable 09/06/2015  . Anxiety and depression 11/05/2009  . Attention deficit disorder 11/05/2009   Lyndee Hensen, PT, DPT 11:59 AM  04/09/18   [facility] [place], [place], [address] Phone: [phone]   Fax:  [phone]  Name: [name] [name] MRN: [id number] Date of Birth: 10/24/1980

## 2018-04-09 NOTE — Patient Instructions (Signed)
Access Code: [email or web address]  URL: [email or web address]  Date: 04/09/2018  Prepared by: Lyndee Hensen   Exercises  Seated Cervical Retraction - 10 reps - 2 sets - 2x daily  Standing Scapular Retraction - 10 reps - 2 sets - 2x daily  Seated Cervical Sidebending Stretch - 3 sets - 30 hold - 2x daily  Standing Row with Resistance - 10 reps - 2 sets - 2x daily  Doorway Pec Stretch at 60 Degrees Abduction - 3 sets - 30 hold - 2x daily  Supine Single Arm Pectoralis Stretch - 10 reps - 2 sets - 2x daily

## 2018-04-12 NOTE — Telephone Encounter (Signed)
As of 04/12/18 case still being reviewed by [facility].

## 2018-04-16 ENCOUNTER — Ambulatory Visit: Payer: No Typology Code available for payment source | Admitting: Physical Therapy

## 2018-04-16 ENCOUNTER — Encounter: Payer: Self-pay | Admitting: Physical Therapy

## 2018-04-16 DIAGNOSIS — M62830 Muscle spasm of back: Secondary | ICD-10-CM

## 2018-04-16 DIAGNOSIS — M542 Cervicalgia: Secondary | ICD-10-CM | POA: Diagnosis not present

## 2018-04-16 NOTE — Therapy (Addendum)
[facility] [address] [place], [place], [address] Phone: [phone]   Fax:  [phone]  Physical Therapy Treatment  Patient Details  Name: [name] [name] MRN: [id number] Date of Birth: 1981-01-07 Referring Provider: Marita Snellen   Encounter Date: 04/16/2018  PT End of Session - 04/16/18 1527    Visit Number  2    Number of Visits  12    Date for PT Re-Evaluation  05/21/18    Authorization Type  UHC    PT Start Time  6834    PT Stop Time  1440    PT Time Calculation (min)  55 min    Activity Tolerance  Patient tolerated treatment well    Behavior During Therapy  [facility] for tasks assessed/performed       Past Medical History:  Diagnosis Date  . ADHD (attention deficit hyperactivity disorder)   . Anxiety   . Depression   . Gestational diabetes   . History of chicken pox   . History of condyloma acuminatum   . Migraines     Past Surgical History:  Procedure Laterality Date  . CESAREAN SECTION  06/25/2011  . CESAREAN SECTION N/A 05/04/2015  . CHOLECYSTECTOMY N/A 07/07/2015  . ERCP N/A 08/24/2015  . KNEE CARTILAGE SURGERY Left     There were no vitals filed for this visit.  Subjective Assessment - 04/16/18 1526    Subjective  Pt states tightness/ soreness in upper traps, neck, has headache today, states she gets migraines regularly.     Currently in Pain?  Yes    Pain Score  5     Pain Location  Neck    Pain Orientation  Right;Left    Pain Descriptors / Indicators  Aching;Tightness    Pain Type  Chronic pain    Pain Onset  More than a month ago    Pain Frequency  Intermittent                       [facility] Adult PT Treatment/Exercise - 04/16/18 0001      Exercises   Exercises  Neck      Neck Exercises: Theraband   Scapula Retraction  20 reps;Red    Other Theraband Exercises  Scap pull outs RTB x20;       Neck Exercises: Seated   Neck Retraction  20 reps    Cervical Rotation  10 reps    Shoulder Rolls  --    Other Seated Exercise  Shoulder pulley x20 for flexion;       Manual Therapy   Manual Therapy  Joint mobilization;Soft tissue mobilization;Manual Traction    Manual therapy comments  skilled palpation and monitoring of soft tissue during DN    Soft tissue mobilization  DTM to Bil UT and cervical paraspinals;  Sub occipital release     Manual Traction  10 sec x10       Neck Exercises: Stretches   Upper Trapezius Stretch  3 reps;30 seconds    Other Neck Stretches  Supine Pec stretch, with wrist flex/ext for nerve glide x20 bil;        Trigger Point Dry Needling - 04/16/18 1532    Consent Given?  Yes    Education Handout Provided  Yes    Muscles Treated Upper Body  Upper trapezius;Levator scapulae   bilateral   Upper Trapezius Response  Twitch reponse elicited;Palpable increased muscle length    Levator Scapulae Response  Twitch response elicited;Palpable  increased muscle length             PT Short Term Goals - 04/09/18 1148      PT SHORT TERM GOAL #1   Title  Pt to be independent with initial HEP     Time  2    Period  Weeks    Status  New    Target Date  04/23/18      PT SHORT TERM GOAL #2   Title  Pt to report decreased pain in cervical region, to 4/10 with activity     Time  2    Period  Weeks    Status  New    Target Date  04/23/18        PT Long Term Goals - 04/09/18 1149      PT LONG TERM GOAL #1   Title  Pt to report decreased pain in cervical region, to 0-2/10 with activity     Time  6    Period  Weeks    Status  New    Target Date  05/21/18      PT LONG TERM GOAL #2   Title  Pt to demo increased strenght of shoulder and scapular muscles, to at least 4+/5 to improve stability , posture, and pain.     Time  6    Period  Weeks    Status  New    Target Date  05/21/18      PT LONG TERM GOAL #3   Title  Pt to demo full cervical and shoulder ROM, without pain, to improve ability for IADls and work duties.     Time  6     Period  Weeks    Status  New    Target Date  05/21/18      PT LONG TERM GOAL #4   Title  Pt to be independent with final HEP     Time  6    Period  Weeks    Status  New    Target Date  05/21/18            Plan - 04/16/18 1528    Clinical Impression Statement  Pt with good response to dry needling today, with decreased pain and improved cervical rotation. She has tightness and trigger points in Bil UT, levator, rhomboid region. Ther ex done for scapular strengthening, plan to progress as tolerated. Patient Consent: After explanation of Trigger Point Dry Needling (TDN)  rationale, procedures, outcomes, and potential side effects, patient verbalized consent to TDN treatment. Post treatment instructions: Patient instructed to expect mild to moderate muscle soreness this evening and tomorrow. Pt instructed to continue prescribed home exercise program. If dry needling over lung field, patient instructed of signs and symptoms of pneumothorax. Although unlikely, patient educated on seeking immediate medical attention, should symptoms occur. Pt verbalized understanding of these instructions.     Rehab Potential  Good    PT Frequency  2x / week    PT Duration  6 weeks    PT Treatment/Interventions  ADLs/Self Care Home Management;Electrical Stimulation;Iontophoresis 1m/ml Dexamethasone;Moist Heat;Cryotherapy;Ultrasound;Functional mobility training;Therapeutic activities;Orthotic Fit/Training;Patient/family education;Neuromuscular re-education;Therapeutic exercise;Manual techniques;Passive range of motion;Dry needling;Taping    Consulted and Agree with Plan of Care  Patient       Patient will benefit from skilled therapeutic intervention in order to improve the following deficits and impairments:  Decreased endurance, Decreased activity tolerance, Decreased strength, Pain, Increased muscle spasms, Decreased range of motion, Improper body mechanics  Visit  Diagnosis: Neck pain  Muscle spasm of  back     Problem List Patient Active Problem List   Diagnosis Date Noted  . Myofascial pain 09/04/2017  . Bilateral carpal tunnel syndrome 09/04/2017  . Ankle impingement syndrome, left 09/04/2017  . Migraine with aura and without status migrainosus, not intractable 09/06/2015  . Anxiety and depression 11/05/2009  . Attention deficit disorder 11/05/2009    Lyndee Hensen, PT, DPT 3:33 PM  04/16/18    [facility] [place], [place], [address] Phone: [phone]   Fax:  [phone]  Name: [name] [name] MRN: [id number] Date of Birth: 14-Jul-1981    PHYSICAL THERAPY DISCHARGE SUMMARY  Visits from Start of Care: 2   Plan: Patient agrees to discharge.  Patient goals were not met. Patient is being discharged due to not returning since the last visit.  ?????      Lyndee Hensen, PT, DPT 10:47 AM  07/01/18

## 2018-04-17 ENCOUNTER — Other Ambulatory Visit: Payer: Self-pay | Admitting: Sports Medicine

## 2018-04-23 ENCOUNTER — Encounter: Payer: No Typology Code available for payment source | Admitting: Physical Therapy

## 2018-04-25 ENCOUNTER — Other Ambulatory Visit: Payer: Self-pay | Admitting: Family Medicine

## 2018-04-25 DIAGNOSIS — Z7184 Encounter for health counseling related to travel: Secondary | ICD-10-CM

## 2018-04-26 MED ORDER — BUTALBITAL-APAP-CAFFEINE 50-325-40 MG PO TABS: 1.0000 | ORAL_TABLET | Freq: Four times a day (QID) | ORAL | 0 refills | Status: DC | PRN

## 2018-04-26 MED ORDER — ONDANSETRON HCL 4 MG PO TABS: 4.0000 mg | ORAL_TABLET | Freq: Three times a day (TID) | ORAL | 0 refills | Status: DC | PRN

## 2018-04-26 NOTE — Telephone Encounter (Signed)
Please advise on refill.

## 2018-04-28 ENCOUNTER — Encounter: Payer: Self-pay | Admitting: Family Medicine

## 2018-04-30 ENCOUNTER — Other Ambulatory Visit: Payer: Self-pay | Admitting: Surgical

## 2018-04-30 MED ORDER — BUTALBITAL-APAP-CAFFEINE 50-325-40 MG PO TABS: 1.0000 | ORAL_TABLET | Freq: Four times a day (QID) | ORAL | 0 refills | Status: DC | PRN

## 2018-05-14 ENCOUNTER — Encounter: Payer: Self-pay | Admitting: Family Medicine

## 2018-05-22 ENCOUNTER — Other Ambulatory Visit: Payer: Self-pay | Admitting: Family Medicine

## 2018-05-22 DIAGNOSIS — M542 Cervicalgia: Secondary | ICD-10-CM

## 2018-05-22 NOTE — Telephone Encounter (Signed)
Please advise on refill.

## 2018-05-24 ENCOUNTER — Other Ambulatory Visit: Payer: Self-pay | Admitting: Family Medicine

## 2018-05-24 DIAGNOSIS — Z7184 Encounter for health counseling related to travel: Secondary | ICD-10-CM

## 2018-05-24 NOTE — Telephone Encounter (Signed)
How often is she taking? Just refilled.

## 2018-05-24 NOTE — Telephone Encounter (Signed)
Left message to return call to our office.  

## 2018-05-27 ENCOUNTER — Ambulatory Visit: Payer: No Typology Code available for payment source | Admitting: Family Medicine

## 2018-05-27 ENCOUNTER — Encounter: Payer: Self-pay | Admitting: Family Medicine

## 2018-05-27 VITALS — BP 112/72 | HR 87 | Temp 98.0°F | Ht 66.0 in | Wt 163.6 lb

## 2018-05-27 DIAGNOSIS — J0100 Acute maxillary sinusitis, unspecified: Secondary | ICD-10-CM | POA: Diagnosis not present

## 2018-05-27 DIAGNOSIS — J209 Acute bronchitis, unspecified: Secondary | ICD-10-CM | POA: Diagnosis not present

## 2018-05-27 MED ORDER — METHYLPREDNISOLONE ACETATE 80 MG/ML IJ SUSP
80.0000 mg | Freq: Once | INTRAMUSCULAR | Status: AC
  Administered 2018-05-27: 80 mg via INTRAMUSCULAR

## 2018-05-27 MED ORDER — HYDROCODONE-HOMATROPINE 5-1.5 MG/5ML PO SYRP: 5.0000 mL | ORAL_SOLUTION | Freq: Every evening | ORAL | 0 refills | Status: DC | PRN

## 2018-05-27 MED ORDER — AZITHROMYCIN 250 MG PO TABS: ORAL_TABLET | ORAL | 0 refills | Status: DC

## 2018-05-27 MED ORDER — PREDNISONE 5 MG PO TABS: ORAL_TABLET | ORAL | 0 refills | Status: DC

## 2018-05-27 NOTE — Progress Notes (Signed)
[name] [name] is a 37 y.o. female here for an acute visit.  History of Present Illness:   Sinusitis  This is a new problem. The current episode started 1 to 4 weeks ago. The problem has been gradually worsening since onset. There has been no fever. The pain is mild. Associated symptoms include chills, congestion, coughing, ear pain, headaches, sinus pressure and swollen glands. Past treatments include nothing.   PMHx, SurgHx, SocialHx, Medications, and Allergies were reviewed in the Visit Navigator and updated as appropriate.  Current Medications:   .  ALPRAZolam (XANAX) 0.5 MG tablet, Take 0.5 mg by mouth 4 (four) times daily as needed for anxiety. , Disp: , Rfl: 5 .  amphetamine-dextroamphetamine (ADDERALL) 20 MG tablet, Take 20 mg by mouth 3 (three) times daily., Disp: , Rfl:  .  butalbital-acetaminophen-caffeine (FIORICET, ESGIC) 50-325-40 MG tablet, Take 1 tablet by mouth every 6 (six) hours as needed for headache., Disp: 20 tablet, Rfl: 0 .  cetirizine (ZYRTEC) 10 MG tablet, Take 10 mg by mouth daily., Disp: , Rfl:  .  cyclobenzaprine (FLEXERIL) 5 MG tablet, TAKE 1 TABLET (5 MG TOTAL) BY MOUTH DAILY AS NEEDED FOR MUSCLE SPASMS., Disp: 20 tablet, Rfl: 0 .  DULoxetine (CYMBALTA) 30 MG capsule, TAKE 1 CAPSULE BY MOUTH EVERY DAY, Disp: 30 capsule, Rfl: 2 .  LYRICA 75 MG capsule, TAKE 1 CAPSULE BY MOUTH TWICE A DAY, Disp: 60 capsule, Rfl: 3 .  MICROGESTIN FE 1/20 1-20 MG-MCG tablet, Take 1 tablet by mouth daily., Disp: , Rfl: 11 .  ondansetron (ZOFRAN) 4 MG tablet, Take 1 tablet (4 mg total) by mouth every 8 (eight) hours as needed for nausea or vomiting., Disp: 20 tablet, Rfl: 0 .  ranitidine (ZANTAC) 150 MG tablet, Take 150 mg by mouth 2 (two) times daily., Disp: , Rfl:   Allergies  Allergen Reactions  . Ibuprofen Hives and Other (See Comments)    stuffiness  . Toradol [Ketorolac Tromethamine] Hives  . Food     Nuts and Yeast  . Latex Hives  . Lovenox [Enoxaparin Sodium] Other  (See Comments)    Face flush and redness  . Nsaids Hives  . Other Hives    CATS  . Penicillins Hives  . Yellow Dyes (Non-Tartrazine) Hives    YELLOW No. 5   Review of Systems:   Pertinent items are noted in the HPI. Otherwise, ROS is negative.  Vitals:   Vitals:   05/27/18 1431  BP: 112/72  Pulse: 87  Temp: 98 F (36.7 C)  TempSrc: Oral  SpO2: 99%  Weight: 163 lb 9.6 oz (74.2 kg)  Height: 5\' 6"  (1.676 m)     Body mass index is 26.41 kg/m.  Physical Exam:   Physical Exam  Constitutional: She appears well-nourished.  HENT:  Head: Normocephalic and atraumatic.  Nose: Right sinus exhibits maxillary sinus tenderness. Left sinus exhibits maxillary sinus tenderness.  Eyes: Pupils are equal, round, and reactive to light. EOM are normal.  Neck: Normal range of motion. Neck supple.  Cardiovascular: Normal rate, regular rhythm, normal heart sounds and intact distal pulses.  Pulmonary/Chest: Effort normal.  Abdominal: Soft.  Skin: Skin is warm.  Psychiatric: She has a normal mood and affect. Her behavior is normal.  Nursing note and vitals reviewed.    Results for orders placed or performed in visit on 08/22/17  HM PAP SMEAR  Result Value Ref Range   HM Pap smear Negative with Negative HPV     Assessment and  Plan:   Germani was seen today for cough.  Diagnoses and all orders for this visit:  Subacute maxillary sinusitis -     azithromycin (ZITHROMAX) 250 MG tablet; 2 PO ON DAY ONE, THEN ONE DAILY UNTIL GONE -     predniSONE (DELTASONE) 5 MG tablet; 6-5-4-3-2-1-off -     methylPREDNISolone acetate (DEPO-MEDROL) injection 80 mg  Bronchitis with bronchospasm -     HYDROcodone-homatropine (HYCODAN) 5-1.5 MG/5ML syrup; Take 5 mLs by mouth at bedtime as needed for cough.    . Reviewed expectations re: course of current medical issues. . Discussed self-management of symptoms. . Outlined signs and symptoms indicating need for more acute intervention. . Patient  verbalized understanding and all questions were answered. [facility] Health Maintenance issues including appropriate healthy diet, exercise, and smoking avoidance were discussed with patient. . See orders for this visit as documented in the electronic medical record. . Patient received an After Visit Summary.   Briscoe Deutscher, DO [place], [facility] 05/27/2018

## 2018-06-03 ENCOUNTER — Encounter: Payer: Self-pay | Admitting: Family Medicine

## 2018-06-05 ENCOUNTER — Other Ambulatory Visit: Payer: Self-pay

## 2018-06-05 MED ORDER — ALBUTEROL SULFATE HFA 108 (90 BASE) MCG/ACT IN AERS: 2.0000 | INHALATION_SPRAY | Freq: Four times a day (QID) | RESPIRATORY_TRACT | 0 refills | Status: DC | PRN

## 2018-06-13 ENCOUNTER — Other Ambulatory Visit: Payer: Self-pay | Admitting: Family Medicine

## 2018-06-13 DIAGNOSIS — M542 Cervicalgia: Secondary | ICD-10-CM

## 2018-06-18 ENCOUNTER — Encounter: Payer: Self-pay | Admitting: Family Medicine

## 2018-06-25 ENCOUNTER — Other Ambulatory Visit: Payer: Self-pay | Admitting: Family Medicine

## 2018-06-26 ENCOUNTER — Encounter: Payer: Self-pay | Admitting: Family Medicine

## 2018-06-26 MED ORDER — BUTALBITAL-APAP-CAFFEINE 50-325-40 MG PO TABS: 1.0000 | ORAL_TABLET | Freq: Four times a day (QID) | ORAL | 0 refills | Status: DC | PRN

## 2018-06-26 NOTE — Telephone Encounter (Signed)
Please advise on refill.

## 2018-06-27 ENCOUNTER — Other Ambulatory Visit: Payer: Self-pay

## 2018-06-27 MED ORDER — BUTALBITAL-APAP-CAFFEINE 50-325-40 MG PO TABS: 1.0000 | ORAL_TABLET | Freq: Four times a day (QID) | ORAL | 0 refills | Status: DC | PRN

## 2018-06-28 ENCOUNTER — Ambulatory Visit: Payer: No Typology Code available for payment source | Admitting: Physician Assistant

## 2018-06-28 ENCOUNTER — Encounter: Payer: Self-pay | Admitting: Physician Assistant

## 2018-06-28 ENCOUNTER — Ambulatory Visit: Payer: Self-pay | Admitting: *Deleted

## 2018-06-28 VITALS — BP 138/84 | HR 95 | Temp 98.6°F | Ht 66.0 in | Wt 163.4 lb

## 2018-06-28 DIAGNOSIS — R1013 Epigastric pain: Secondary | ICD-10-CM

## 2018-06-28 LAB — URINALYSIS, ROUTINE W REFLEX MICROSCOPIC
Bilirubin Urine: NEGATIVE
Ketones, ur: NEGATIVE
Leukocytes, UA: NEGATIVE
Nitrite: NEGATIVE
SPECIFIC GRAVITY, URINE: 1.025 (ref 1.000–1.030)
Total Protein, Urine: NEGATIVE
URINE GLUCOSE: NEGATIVE
UROBILINOGEN UA: 0.2 (ref 0.0–1.0)
pH: 6.5 (ref 5.0–8.0)

## 2018-06-28 LAB — CBC WITH DIFFERENTIAL/PLATELET
BASOS ABS: 0 10*3/uL (ref 0.0–0.1)
Basophils Relative: 0.2 % (ref 0.0–3.0)
EOS ABS: 0.2 10*3/uL (ref 0.0–0.7)
Eosinophils Relative: 2.1 % (ref 0.0–5.0)
HEMATOCRIT: 36.9 % (ref 36.0–46.0)
Hemoglobin: 12.8 g/dL (ref 12.0–15.0)
Lymphocytes Relative: 24.4 % (ref 12.0–46.0)
Lymphs Abs: 2.3 10*3/uL (ref 0.7–4.0)
MCHC: 34.6 g/dL (ref 30.0–36.0)
MCV: 88.6 fl (ref 78.0–100.0)
MONO ABS: 0.4 10*3/uL (ref 0.1–1.0)
Monocytes Relative: 4.5 % (ref 3.0–12.0)
Neutro Abs: 6.5 10*3/uL (ref 1.4–7.7)
Neutrophils Relative %: 68.8 % (ref 43.0–77.0)
Platelets: 244 10*3/uL (ref 150.0–400.0)
RBC: 4.16 Mil/uL (ref 3.87–5.11)
RDW: 13 % (ref 11.5–15.5)
WBC: 9.4 10*3/uL (ref 4.0–10.5)

## 2018-06-28 LAB — COMPREHENSIVE METABOLIC PANEL
ALBUMIN: 4.3 g/dL (ref 3.5–5.2)
ALT: 13 U/L (ref 0–35)
AST: 14 U/L (ref 0–37)
Alkaline Phosphatase: 35 U/L — ABNORMAL LOW (ref 39–117)
BILIRUBIN TOTAL: 0.4 mg/dL (ref 0.2–1.2)
BUN: 12 mg/dL (ref 6–23)
CALCIUM: 8.7 mg/dL (ref 8.4–10.5)
CHLORIDE: 105 meq/L (ref 96–112)
CO2: 26 mEq/L (ref 19–32)
CREATININE: 0.67 mg/dL (ref 0.40–1.20)
GFR: 105.33 mL/min (ref >60.00)
Glucose, Bld: 89 mg/dL (ref 70–99)
Potassium: 3.8 mEq/L (ref 3.5–5.1)
SODIUM: 139 meq/L (ref 135–145)
Total Protein: 6.7 g/dL (ref 6.0–8.3)

## 2018-06-28 LAB — LIPASE: LIPASE: 94 U/L — AB (ref 11.0–59.0)

## 2018-06-28 LAB — POCT URINE PREGNANCY: Preg Test, Ur: NEGATIVE

## 2018-06-28 MED ORDER — PANTOPRAZOLE SODIUM 40 MG PO TBEC: 40.0000 mg | DELAYED_RELEASE_TABLET | Freq: Every day | ORAL | 0 refills | Status: DC

## 2018-06-28 MED ORDER — OXYCODONE-ACETAMINOPHEN 5-325 MG PO TABS: 1.0000 | ORAL_TABLET | Freq: Three times a day (TID) | ORAL | 0 refills | Status: DC | PRN

## 2018-06-28 MED ORDER — SUCRALFATE 1 G PO TABS: 1.0000 g | ORAL_TABLET | Freq: Three times a day (TID) | ORAL | 0 refills | Status: DC

## 2018-06-28 NOTE — Telephone Encounter (Signed)
Pt having upper mid abd pain that goes thru to her back. Feels like gallbladder type pain. Gallbladder has been removed. The area also is tender to touch. Pain has been going on for about 3 days.  She denies fever, vomiting or diarrhea. She does have some nausea which she thinks is from the pain. She is burping. She has had 3 bowel movements. The first one being normal, second one looser and the third one was very small amount. No red or black stools.  Appointment scheduled per protocol, for today. Will route to flow at [facility].  Reason for Disposition . [1] MILD-MODERATE pain AND [2] constant AND [3] present > 2 hours  Answer Assessment - Initial Assessment Questions 1. LOCATION: "Where does it hurt?"      Midsternum, behind the rib cage 2. RADIATION: "Does the pain shoot anywhere else?" (e.g., chest, back)    Thru to her back, radiates to the left side 3. ONSET: "When did the pain begin?" (e.g., minutes, hours or days ago)      3 days 4. SUDDEN: "Gradual or sudden onset?"     gradual 5. PATTERN "Does the pain come and go, or is it constant?"    - If constant: "Is it getting better, staying the same, or worsening?"      (Note: Constant means the pain never goes away completely; most serious pain is constant and it progresses)     - If intermittent: "How long does it last?" "Do you have pain now?"     (Note: Intermittent means the pain goes away completely between bouts)     constant 6. SEVERITY: "How bad is the pain?"  (e.g., Scale 1-10; mild, moderate, or severe)    - MILD (1-3): doesn't interfere with normal activities, abdomen soft and not tender to touch     - MODERATE (4-7): interferes with normal activities or awakens from sleep, tender to touch     - SEVERE (8-10): excruciating pain, doubled over, unable to do any normal activities       Pain # 7  7. RECURRENT SYMPTOM: "Have you ever had this type of abdominal pain before?" If so, ask: "When was the last time?"  and "What happened that time?"      Gallbladder type pain 8. AGGRAVATING FACTORS: "Does anything seem to cause this pain?" (e.g., foods, stress, alcohol)     no 9. CARDIAC SYMPTOMS: "Do you have any of the following symptoms: chest pain, difficulty breathing, sweating, nausea?"     Nausea when she tries to eat, holds her breath to ease with pain 10. OTHER SYMPTOMS: "Do you have any other symptoms?" (e.g., fever, vomiting, diarrhea)       no 11. PREGNANCY: "Is there any chance you are pregnant?" "When was your last menstrual period?"       No, LMP  The week of Oct 22nd  Protocols used: ABDOMINAL PAIN - UPPER-A-AH

## 2018-06-28 NOTE — Progress Notes (Signed)
[name] [name] is a 38 y.o. female here for a new problem.  History of Present Illness:   Chief Complaint  Patient presents with  . Abdominal Pain               HPI Patient is here today C/O epigastric pain that is moderate to severe. Pain 7/10, hx of cholecystectomy in Nov 2016.  Also endorses increased anxiety - states nothing in particular, but is taking her xanax more often than she normally does.  She first noticed the pain the beginning of the week then it worsened on 06/26/17 after moving a table.  Pain is worse when sits or tries to relax and radiates straight through to bra strap.  Has tried a Lidocaine Patch on her back, oral tums but sx persist.   Just after eating she feels full, nauseated and full of air with lots of belching.  Denies any diarrhea or constipation, or black, bloody or tarry stools.  When she burps she sometimes burps up acids. She states that this morning she had a bacon, egg and cheese McGriddle. Denies dysuria.  She states that she does not drink ETOH.   Past Medical History:  Diagnosis Date  . ADHD (attention deficit hyperactivity disorder)   . Anxiety   . Depression   . Gestational diabetes   . History of chicken pox   . History of condyloma acuminatum   . Migraines      Social History   Socioeconomic History  . Marital status: Married    Spouse name: Not on file  . Number of children: 2  . Years of education: Not on file  . Highest education level: Not on file  Occupational History  . Occupation: Doesn't work  Scientific laboratory technician  . Financial resource strain: Not on file  . Food insecurity:    Worry: Not on file    Inability: Not on file  . Transportation needs:    Medical: Not on file    Non-medical: Not on file  Tobacco Use  . Smoking status: Current Some Day Smoker  . Smokeless tobacco: Never Used  . Tobacco comment: smokes socially (1 cigarette a week)  Substance and Sexual Activity  . Alcohol use: No    Alcohol/week: 0.0  standard drinks  . Drug use: No  . Sexual activity: Yes  Lifestyle  . Physical activity:    Days per week: Not on file    Minutes per session: Not on file  . Stress: Not on file  Relationships  . Social connections:    Talks on phone: Not on file    Gets together: Not on file    Attends religious service: Not on file    Active member of club or organization: Not on file    Attends meetings of clubs or organizations: Not on file    Relationship status: Not on file  . Intimate partner violence:    Fear of current or ex partner: Not on file    Emotionally abused: Not on file    Physically abused: Not on file    Forced sexual activity: Not on file  Other Topics Concern  . Not on file  Social History Narrative   2012- daughter   2016- son   Married   Does not work.  She was previously a Emergency planning/management officer, last worked in 2012.    Enjoys reading, shopping, beach    Past Surgical History:  Procedure Laterality Date  . CESAREAN SECTION  06/25/2011  .  CESAREAN SECTION N/A 05/04/2015  . CHOLECYSTECTOMY N/A 07/07/2015  . ERCP N/A 08/24/2015  . KNEE CARTILAGE SURGERY Left     Family History  Problem Relation Age of Onset  . Hypertension Mother   . Migraines Mother   . Hypertension Father   . Migraines Sister   . Heart disease Maternal Grandmother   . Heart disease Maternal Grandfather   . Diabetes Paternal Grandmother   . Cancer Paternal Grandfather   . Healthy Son   . Healthy Daughter   . Anesthesia problems Neg Hx   . Hypotension Neg Hx   . Malignant hyperthermia Neg Hx   . Pseudochol deficiency Neg Hx     Allergies  Allergen Reactions  . Ibuprofen Hives and Other (See Comments)    stuffiness  . Toradol [Ketorolac Tromethamine] Hives  . Food     Nuts and Yeast  . Latex Hives  . Lovenox [Enoxaparin Sodium] Other (See Comments)    Face flush and redness  . Nsaids Hives  . Other Hives    CATS  . Penicillins Hives  . Yellow Dyes (Non-Tartrazine) Hives    YELLOW  No. 5    Current Medications:   Current Outpatient Medications:  .  albuterol (PROVENTIL HFA;VENTOLIN HFA) 108 (90 Base) MCG/ACT inhaler, Inhale 2 puffs into the lungs every 6 (six) hours as needed for wheezing or shortness of breath., Disp: 1 Inhaler, Rfl: 0 .  ALPRAZolam (XANAX) 0.5 MG tablet, Take 0.5 mg by mouth 4 (four) times daily as needed for anxiety. , Disp: , Rfl: 5 .  amphetamine-dextroamphetamine (ADDERALL) 20 MG tablet, Take 20 mg by mouth 3 (three) times daily., Disp: , Rfl:  .  butalbital-acetaminophen-caffeine (FIORICET, ESGIC) 50-325-40 MG tablet, Take 1 tablet by mouth every 6 (six) hours as needed for headache., Disp: 20 tablet, Rfl: 0 .  cetirizine (ZYRTEC) 10 MG tablet, Take 10 mg by mouth daily., Disp: , Rfl:  .  cyclobenzaprine (FLEXERIL) 5 MG tablet, TAKE 1 TABLET (5 MG TOTAL) BY MOUTH DAILY AS NEEDED FOR MUSCLE SPASMS., Disp: 20 tablet, Rfl: 0 .  DULoxetine (CYMBALTA) 30 MG capsule, TAKE 1 CAPSULE BY MOUTH EVERY DAY, Disp: 30 capsule, Rfl: 2 .  MICROGESTIN FE 1/20 1-20 MG-MCG tablet, Take 1 tablet by mouth daily., Disp: , Rfl: 11 .  ondansetron (ZOFRAN) 4 MG tablet, Take 1 tablet (4 mg total) by mouth every 8 (eight) hours as needed for nausea or vomiting., Disp: 20 tablet, Rfl: 0 .  ranitidine (ZANTAC) 150 MG tablet, Take 150 mg by mouth 2 (two) times daily., Disp: , Rfl:  .  oxyCODONE-acetaminophen (PERCOCET/ROXICET) 5-325 MG tablet, Take 1 tablet by mouth every 8 (eight) hours as needed for severe pain., Disp: 8 tablet, Rfl: 0 .  pantoprazole (PROTONIX) 40 MG tablet, Take 1 tablet (40 mg total) by mouth daily., Disp: 30 tablet, Rfl: 0 .  sucralfate (CARAFATE) 1 g tablet, Take 1 tablet (1 g total) by mouth 4 (four) times daily -  with meals and at bedtime., Disp: 48 tablet, Rfl: 0   Review of Systems:   ROS  Negative unless otherwise specified per HPI.  Vitals:   Vitals:   06/28/18 1134  BP: 138/84  Pulse: 95  Temp: 98.6 F (37 C)  TempSrc: Oral  SpO2: 98%   Weight: 163 lb 6.4 oz (74.1 kg)  Height: 5\' 6"  (1.676 m)  x   Body mass index is 26.37 kg/m.  Physical Exam:   Physical Exam  Constitutional: She appears  well-developed. She is cooperative.  Non-toxic appearance. She does not have a sickly appearance. She does not appear ill. No distress.  Cardiovascular: Normal rate, regular rhythm, S1 normal, S2 normal, normal heart sounds and normal pulses.  No LE edema  Pulmonary/Chest: Effort normal and breath sounds normal.  Abdominal: Normal appearance and bowel sounds are normal. There is tenderness in the epigastric area. There is no rigidity, no rebound, no guarding and no CVA tenderness.  Neurological: She is alert. GCS eye subscore is 4. GCS verbal subscore is 5. GCS motor subscore is 6.  Skin: Skin is warm, dry and intact.  Psychiatric: She has a normal mood and affect. Her speech is normal and behavior is normal.  Nursing note and vitals reviewed.  EKG tracing is personally reviewed.  EKG notes NSR.  No acute changes.   Assessment and Plan:    Tifani was seen today for abdominal pain and addendum.  Diagnoses and all orders for this visit:  Epigastric pain Urine pregnancy negative. Lipase elevated at 90 and trace blood in urine. She had some improvement in symptoms with GI cocktail. Discussed need for low fat diet and hydration. Stop Zantac and start Protonix and Carafate. Pain control. Strict ER precautions given including any change in pain, development of fever, rapid pulse, difficulty in breathing -- needs to go to the ER. If sx persist next week, will consider obtaining imaging.  -     EKG 12-Lead -     Lipase -     CBC with Differential/Platelet -     Comprehensive metabolic panel -     Urinalysis, Routine w reflex microscopic -     POCT urine pregnancy  Other orders -     sucralfate (CARAFATE) 1 g tablet; Take 1 tablet (1 g total) by mouth 4 (four) times daily -  with meals and at bedtime. -     pantoprazole (PROTONIX) 40  MG tablet; Take 1 tablet (40 mg total) by mouth daily. -     oxyCODONE-acetaminophen (PERCOCET/ROXICET) 5-325 MG tablet; Take 1 tablet by mouth every 8 (eight) hours as needed for severe pain.  . Reviewed expectations re: course of current medical issues. . Discussed self-management of symptoms. . Outlined signs and symptoms indicating need for more acute intervention. . Patient verbalized understanding and all questions were answered. . See orders for this visit as documented in the electronic medical record. . Patient received an After-Visit Summary.  Inda Coke, PA-C

## 2018-06-28 NOTE — Patient Instructions (Signed)
It was great to see you!  I would like for you to start carafate four times daily -- before meals and at bedtime.  We are going to stop your zantac and change to protonix. Start this today.  Avoid high fat foods.  If symptoms worsen over the weekend, please go to the ER.   Gastritis, Adult Gastritis is inflammation of the stomach. There are two kinds of gastritis:  Acute gastritis. This kind develops suddenly.  Chronic gastritis. This kind lasts for a long time.  Gastritis happens when the lining of the stomach becomes weak or gets damaged. Without treatment, gastritis can lead to stomach bleeding and ulcers. What are the causes? This condition may be caused by:  An infection.  Drinking too much alcohol.  Certain medicines.  Having too much acid in the stomach.  A disease of the intestines or stomach.  Stress.  What are the signs or symptoms? Symptoms of this condition include:  Pain or a burning in the upper abdomen.  Nausea.  Vomiting.  An uncomfortable feeling of fullness after eating.  In some cases, there are no symptoms. How is this diagnosed? This condition may be diagnosed with:  A description of your symptoms.  A physical exam.  Tests. These can include: ? Blood tests. ? Stool tests. ? A test in which a thin, flexible instrument with a light and camera on the end is passed down the esophagus and into the stomach (upper endoscopy). ? A test in which a sample of tissue is taken for testing (biopsy).  How is this treated? This condition may be treated with medicines. If the condition is caused by a bacterial infection, you may be given antibiotic medicines. If it is caused by too much acid in the stomach, you may get medicines called H2 blockers, proton pump inhibitors, or antacids. Treatment may also involve stopping the use of certain medicines, such as aspirin, ibuprofen, or other nonsteroidal anti-inflammatory drugs (NSAIDs). Follow these  instructions at home:  Take over-the-counter and prescription medicines only as told by your health care provider.  If you were prescribed an antibiotic, take it as told by your health care provider. Do not stop taking the antibiotic even if you start to feel better.  Drink enough fluid to keep your urine clear or pale yellow.  Eat small, frequent meals instead of large meals. Contact a health care provider if:  Your symptoms get worse.  Your symptoms return after treatment. Get help right away if:  You vomit blood or material that looks like coffee grounds.  You have black or dark red stools.  You are unable to keep fluids down.  Your abdominal pain gets worse.  You have a fever.  You do not feel better after 1 week. This information is not intended to replace advice given to you by your health care provider. Make sure you discuss any questions you have with your health care provider. Document Released: 08/08/2001 Document Revised: 04/12/2016 Document Reviewed: 05/08/2015 Elsevier Interactive Patient Education  [employer].

## 2018-07-01 ENCOUNTER — Encounter: Payer: Self-pay | Admitting: Physician Assistant

## 2018-07-01 ENCOUNTER — Other Ambulatory Visit: Payer: Self-pay | Admitting: Physician Assistant

## 2018-07-01 ENCOUNTER — Telehealth: Payer: Self-pay | Admitting: *Deleted

## 2018-07-01 DIAGNOSIS — R1013 Epigastric pain: Secondary | ICD-10-CM

## 2018-07-01 DIAGNOSIS — R748 Abnormal levels of other serum enzymes: Secondary | ICD-10-CM

## 2018-07-01 MED ORDER — OXYCODONE-ACETAMINOPHEN 5-325 MG PO TABS: 1.0000 | ORAL_TABLET | Freq: Three times a day (TID) | ORAL | 0 refills | Status: DC | PRN

## 2018-07-01 NOTE — Telephone Encounter (Signed)
Please see message and advise 

## 2018-07-01 NOTE — Telephone Encounter (Signed)
Copied from [place] [id number]. Topic: General - Inquiry >> Jul 01, 2018  8:19 AM Conception Chancy, NT wrote: Reason for CRM: patient was seen on 06/28/18 and was told to call Inda Coke back today with a update. Patient states she is still not better, still in pain, and the only thing to take the pain away is the pain medicine. She would like to know the next steps.

## 2018-07-01 NOTE — Telephone Encounter (Signed)
I have called and spoke with patient personally.  We reviewed ER precautions. I have put in GI referral urgently and ordered CT abd/pelvis.  She was told to stop carafate -- she felt as though it was causing itching. Will add to her allergies.

## 2018-07-02 ENCOUNTER — Ambulatory Visit
Admission: RE | Admit: 2018-07-02 | Discharge: 2018-07-02 | Disposition: A | Discharge status: Home / Self Care | Payer: No Typology Code available for payment source | Source: Ambulatory Visit | Attending: Physician Assistant | Admitting: Physician Assistant

## 2018-07-02 DIAGNOSIS — R748 Abnormal levels of other serum enzymes: Secondary | ICD-10-CM

## 2018-07-02 DIAGNOSIS — R1013 Epigastric pain: Secondary | ICD-10-CM

## 2018-07-02 MED ORDER — IOPAMIDOL (ISOVUE-300) INJECTION 61%
100.0000 mL | Freq: Once | INTRAVENOUS | Status: AC | PRN
  Administered 2018-07-02: 100 mL via INTRAVENOUS

## 2018-07-03 ENCOUNTER — Other Ambulatory Visit: Payer: No Typology Code available for payment source

## 2018-07-03 ENCOUNTER — Ambulatory Visit: Payer: No Typology Code available for payment source | Admitting: Physician Assistant

## 2018-07-03 ENCOUNTER — Encounter: Payer: Self-pay | Admitting: Physician Assistant

## 2018-07-03 ENCOUNTER — Other Ambulatory Visit (INDEPENDENT_AMBULATORY_CARE_PROVIDER_SITE_OTHER): Payer: No Typology Code available for payment source

## 2018-07-03 VITALS — BP 118/74 | HR 81 | Ht 66.0 in | Wt 169.2 lb

## 2018-07-03 DIAGNOSIS — R1013 Epigastric pain: Secondary | ICD-10-CM | POA: Diagnosis not present

## 2018-07-03 DIAGNOSIS — R748 Abnormal levels of other serum enzymes: Secondary | ICD-10-CM | POA: Diagnosis not present

## 2018-07-03 DIAGNOSIS — R935 Abnormal findings on diagnostic imaging of other abdominal regions, including retroperitoneum: Secondary | ICD-10-CM | POA: Diagnosis not present

## 2018-07-03 DIAGNOSIS — R112 Nausea with vomiting, unspecified: Secondary | ICD-10-CM | POA: Diagnosis not present

## 2018-07-03 LAB — HEPATIC FUNCTION PANEL
ALT: 11 U/L (ref 0–35)
AST: 13 U/L (ref 0–37)
Albumin: 3.8 g/dL (ref 3.5–5.2)
Alkaline Phosphatase: 32 U/L — ABNORMAL LOW (ref 39–117)
BILIRUBIN TOTAL: 0.2 mg/dL (ref 0.2–1.2)
Bilirubin, Direct: 0.1 mg/dL (ref 0.0–0.3)
TOTAL PROTEIN: 6.3 g/dL (ref 6.0–8.3)

## 2018-07-03 LAB — LIPASE: LIPASE: 19 U/L (ref 11.0–59.0)

## 2018-07-03 MED ORDER — OXYCODONE-ACETAMINOPHEN 5-325 MG PO TABS: 1.0000 | ORAL_TABLET | Freq: Three times a day (TID) | ORAL | 0 refills | Status: DC | PRN

## 2018-07-03 NOTE — Progress Notes (Signed)
Chief Complaint: Epigastric pain, nausea, elevated lipase, abnormal CT the abdomen  HPI:    [name] [name] is a 37 year old female with a past medical history as listed below, who was referred to me by Inda Coke, PA for a complaint of epigastric pain, nausea, elevated lipase and abnormal CT of the abdomen.      Patient's history significant for a similar episode in 2016 with elevated LFTs and dilated bile ducts, ERCP performed and patient was thought to have passed a bile duct stone.  Patient is status post cholecystectomy.    06/28/2018 labs showed an elevated lipase of 94 (34 2 years ago).  CBC was normal.  CMP was normal.  Urine pregnancy and urinalysis were normal.    07/02/2018 CT showed a common bile duct and intrahepatic biliary dilatation with CBD measuring up to 13 mm, without obstructing cause identified on that CT.  It appeared slightly increased since 08/22/2015 MRI.  Also trace free pelvic fluid.    Today, patient describes that about 2 weeks ago she developed an epigastric pain which wraps around to her back.  Initially thought this was musculoskeletal as she had helped her husband move something, but it has worsened over the past 2 weeks.  This pain is worse as an 8-9/10 and is worse when sitting up.  This gets somewhat better if she lays down or stands. Constant pressure feeling with sometimes sharp burning pain.  Associated with nausea and occasional episodes of vomiting.  Nausea has now become constant.  Decreased to a bland/low-fat diet over the past week or so which helps slightly, but eating still increases the symptoms.  Was started on Pantoprazole 40 mg twice daily and Carafate by her PCP.  Tells me she had a reaction to the Carafate and stopped this, but has continued the Pantoprazole and notices no difference in her symptoms.  Did use GI cocktail at one point which seemed to help for "a second or two".  Currently, using Percocet every 8 hours when she is having terrible pain.   Also using Zofran 4 mg as needed.   This pain seems very similar to patient's episode of pancreatitis/gallstone before when she had to have her ERCP.    Denies fevers, chills, weight loss, blood in her stool or symptoms that awaken her from sleep.  Past Medical History:  Diagnosis Date  . ADHD (attention deficit hyperactivity disorder)   . Anxiety   . Depression   . Fibromyalgia   . Gallstones   . Gestational diabetes   . History of chicken pox   . History of condyloma acuminatum   . Migraines     Past Surgical History:  Procedure Laterality Date  . CESAREAN SECTION  06/25/2011  . CESAREAN SECTION N/A 05/04/2015  . CHOLECYSTECTOMY N/A 07/07/2015  . ERCP N/A 08/24/2015  . KNEE CARTILAGE SURGERY Left     Current Outpatient Medications  Medication Sig Dispense Refill  . albuterol (PROVENTIL HFA;VENTOLIN HFA) 108 (90 Base) MCG/ACT inhaler Inhale 2 puffs into the lungs every 6 (six) hours as needed for wheezing or shortness of breath. 1 Inhaler 0  . ALPRAZolam (XANAX) 0.5 MG tablet Take 0.5 mg by mouth 4 (four) times daily as needed for anxiety.   5  . amphetamine-dextroamphetamine (ADDERALL) 20 MG tablet Take 20 mg by mouth 3 (three) times daily.    . butalbital-acetaminophen-caffeine (FIORICET, ESGIC) 50-325-40 MG tablet Take 1 tablet by mouth every 6 (six) hours as needed for headache. 20 tablet 0  .  cetirizine (ZYRTEC) 10 MG tablet Take 10 mg by mouth daily.    . cyclobenzaprine (FLEXERIL) 5 MG tablet TAKE 1 TABLET (5 MG TOTAL) BY MOUTH DAILY AS NEEDED FOR MUSCLE SPASMS. 20 tablet 0  . DULoxetine (CYMBALTA) 30 MG capsule TAKE 1 CAPSULE BY MOUTH EVERY DAY 30 capsule 2  . MICROGESTIN FE 1/20 1-20 MG-MCG tablet Take 1 tablet by mouth daily.  11  . ondansetron (ZOFRAN) 4 MG tablet Take 1 tablet (4 mg total) by mouth every 8 (eight) hours as needed for nausea or vomiting. 20 tablet 0  . oxyCODONE-acetaminophen (PERCOCET/ROXICET) 5-325 MG tablet Take 1 tablet by mouth every 8 (eight) hours  as needed for severe pain. 8 tablet 0  . pantoprazole (PROTONIX) 40 MG tablet Take 1 tablet (40 mg total) by mouth daily. 30 tablet 0  . sucralfate (CARAFATE) 1 g tablet Take 1 tablet (1 g total) by mouth 4 (four) times daily -  with meals and at bedtime. (Patient not taking: Reported on 07/03/2018) 48 tablet 0   No current facility-administered medications for this visit.     Allergies as of 07/03/2018 - Review Complete 07/03/2018  Allergen Reaction Noted  . Ibuprofen Hives and Other (See Comments) 06/22/2011  . Toradol [ketorolac tromethamine] Hives 01/27/2018  . Carafate [sucralfate] Itching 07/01/2018  . Food  05/14/2011  . Latex Hives 04/23/2015  . Lovenox [enoxaparin sodium] Other (See Comments) 07/08/2015  . Nsaids Hives 01/08/2018  . Other Hives 09/01/2015  . Penicillins Hives 11/09/2009  . Yellow dyes (non-tartrazine) Hives 09/01/2015    Family History  Problem Relation Age of Onset  . Hypertension Mother   . Migraines Mother   . Hypertension Father   . Migraines Sister   . Heart disease Maternal Grandmother   . Heart disease Maternal Grandfather   . Diabetes Paternal Grandmother   . Prostate cancer Paternal Grandfather   . Esophageal cancer Paternal Grandfather   . Healthy Son   . Healthy Daughter   . Anesthesia problems Neg Hx   . Hypotension Neg Hx   . Malignant hyperthermia Neg Hx   . Pseudochol deficiency Neg Hx   . Colon cancer Neg Hx     Social History   Socioeconomic History  . Marital status: Married    Spouse name: Not on file  . Number of children: 2  . Years of education: Not on file  . Highest education level: Not on file  Occupational History  . Occupation: Doesn't work  Scientific laboratory technician  . Financial resource strain: Not on file  . Food insecurity:    Worry: Not on file    Inability: Not on file  . Transportation needs:    Medical: Not on file    Non-medical: Not on file  Tobacco Use  . Smoking status: Current Some Day Smoker  . Smokeless  tobacco: Never Used  . Tobacco comment: smokes socially (1 cigarette a week)  Substance and Sexual Activity  . Alcohol use: No    Alcohol/week: 0.0 standard drinks  . Drug use: No  . Sexual activity: Yes  Lifestyle  . Physical activity:    Days per week: Not on file    Minutes per session: Not on file  . Stress: Not on file  Relationships  . Social connections:    Talks on phone: Not on file    Gets together: Not on file    Attends religious service: Not on file    Active member of club or organization: Not  on file    Attends meetings of clubs or organizations: Not on file    Relationship status: Not on file  . Intimate partner violence:    Fear of current or ex partner: Not on file    Emotionally abused: Not on file    Physically abused: Not on file    Forced sexual activity: Not on file  Other Topics Concern  . Not on file  Social History Narrative   2012- daughter   2016- son   Married   Does not work.  She was previously a Emergency planning/management officer, last worked in 2012.    Enjoys reading, shopping, beach    Review of Systems:    Constitutional: No weight loss, fever or chills Cardiovascular: No chest pain  Respiratory: No SOB  Gastrointestinal: See HPI and otherwise negative Genitourinary: No dysuria Neurological: No headache, dizziness or syncope Musculoskeletal: No new muscle or joint pain Hematologic: No bleeding  Psychiatric: No history of depression or anxiety   Physical Exam:  Vital signs: BP 118/74   Pulse 81   Ht 5\' 6"  (1.676 m)   Wt 169 lb 4 oz (76.8 kg)   BMI 27.32 kg/m   Constitutional:   Pleasant uncomfortable Caucasian female appears to be in NAD, Well developed, Well nourished, alert and cooperative Head:  Normocephalic and atraumatic. Eyes:   PEERL, EOMI. No icterus. Conjunctiva pink. Ears:  Normal auditory acuity. Neck:  Supple Throat: Oral cavity and pharynx without inflammation, swelling or lesion.  Respiratory: Respirations even and  unlabored. Lungs clear to auscultation bilaterally.   No wheezes, crackles, or rhonchi.  Cardiovascular: Normal S1, S2. No MRG. Regular rate and rhythm. No peripheral edema, cyanosis or pallor.  Gastrointestinal:  Soft, nondistended, moderate epigastric ttp. No rebound or guarding. Normal bowel sounds. No appreciable masses or hepatomegaly. Rectal:  Not performed.  Msk:  Symmetrical without gross deformities. Without edema, no deformity or joint abnormality.  Neurologic:  Alert and  oriented x4;  grossly normal neurologically.  Skin:   Dry and intact without significant lesions or rashes. Psychiatric: Demonstrates good judgement and reason without abnormal affect or behaviors.  RELEVANT LABS AND IMAGING: CBC    Component Value Date/Time   WBC 9.4 06/28/2018 1204   RBC 4.16 06/28/2018 1204   HGB 12.8 06/28/2018 1204   HCT 36.9 06/28/2018 1204   PLT 244.0 06/28/2018 1204   MCV 88.6 06/28/2018 1204   MCH 24.5 (L) 08/24/2015 0600   MCHC 34.6 06/28/2018 1204   RDW 13.0 06/28/2018 1204   LYMPHSABS 2.3 06/28/2018 1204   MONOABS 0.4 06/28/2018 1204   EOSABS 0.2 06/28/2018 1204   BASOSABS 0.0 06/28/2018 1204    CMP     Component Value Date/Time   NA 139 06/28/2018 1204   K 3.8 06/28/2018 1204   CL 105 06/28/2018 1204   CO2 26 06/28/2018 1204   GLUCOSE 89 06/28/2018 1204   BUN 12 06/28/2018 1204   CREATININE 0.67 06/28/2018 1204   CALCIUM 8.7 06/28/2018 1204   PROT 6.7 06/28/2018 1204   ALBUMIN 4.3 06/28/2018 1204   AST 14 06/28/2018 1204   ALT 13 06/28/2018 1204   ALKPHOS 35 (L) 06/28/2018 1204   BILITOT 0.4 06/28/2018 1204   GFRNONAA >60 08/26/2015 0822   GFRAA >60 08/26/2015 0822   Lipase     Component Value Date/Time   LIPASE 94.0 (H) 06/28/2018 1204    Assessment: 1.  Epigastric pain: Over the past 2 weeks with some nausea and episodes of  vomiting, elevated lipase and abnormal CT the bile ducts, the pancreas appeared normal on recent imaging; consider  choledocholithiasis versus other 2.  Abnormal CT of the bile ducts: Dilated, even more so than on last MRI in 2016, consider choledocholithiasis 3.  Elevated lipase: 94 recently 4.  Nausea  Plan: 1.  Discussed with patient I will repeat an MRCP stat to ensure there are no bile duct stones.  If this is the case patient would need an ERCP. 2.  Repeated LFTs today and lipase. 3.  Refilled patient's Percocet, one tab q8 hours for pain  #15 with no refills. 4.  Patient was placed on a clear liquid diet today. 5.  Patient to call our clinic if she has any acute changes in her symptoms and or proceed to the ER.  Otherwise we will call her with results from above and let her know next steps. 6.  Patient was assigned to Dr. Tarri Glenn today.  Ellouise Newer, PA-C [facility] 07/03/2018, 9:03 AM  Cc: Inda Coke, [place]

## 2018-07-03 NOTE — Progress Notes (Signed)
Reviewed. I agree with documentation including the assessment and plan.  Raelynn Corron L. Cynda Soule, MD, MPH 

## 2018-07-03 NOTE — Patient Instructions (Signed)
Your provider has requested that you go to the basement level for lab work before leaving today. Press "B" on the elevator. The lab is located at the first door on the left as you exit the elevator.  You have been scheduled for an MRI at [facility] on 07-04-2018 Your appointment time is 1:00 PM. Please arrive at 33;30 PM  to your appointment time for registration purposes. Please make certain not to have anything to eat or drink 4 hours prior to your test. In addition, if you have any metal in your body, have a pacemaker or defibrillator, please be sure to let your ordering physician know. This test typically takes 45 minutes to 1 hour to complete. Should you need to reschedule, please call [phone] to do so.  Clear liquid diet for now.  We have provided you with Percocet 5/325 mg . Take 1 tab every 8 hours for pain.  Normal BMI (Body Mass Index- based on height and weight) is between 19 and 25. Your BMI today is Body mass index is 27.32 kg/m. Marland Kitchen Please consider follow up  regarding your BMI with your Primary Care Provider.

## 2018-07-04 ENCOUNTER — Ambulatory Visit (HOSPITAL_COMMUNITY)
Admission: RE | Admit: 2018-07-04 | Discharge: 2018-07-04 | Disposition: A | Discharge status: Home / Self Care | Payer: No Typology Code available for payment source | Source: Ambulatory Visit | Attending: Physician Assistant | Admitting: Physician Assistant

## 2018-07-04 DIAGNOSIS — R1013 Epigastric pain: Secondary | ICD-10-CM | POA: Insufficient documentation

## 2018-07-04 DIAGNOSIS — Z9049 Acquired absence of other specified parts of digestive tract: Secondary | ICD-10-CM | POA: Insufficient documentation

## 2018-07-04 DIAGNOSIS — R935 Abnormal findings on diagnostic imaging of other abdominal regions, including retroperitoneum: Secondary | ICD-10-CM | POA: Diagnosis not present

## 2018-07-04 DIAGNOSIS — R748 Abnormal levels of other serum enzymes: Secondary | ICD-10-CM | POA: Insufficient documentation

## 2018-07-04 MED ORDER — GADOBUTROL 1 MMOL/ML IV SOLN
7.5000 mL | Freq: Once | INTRAVENOUS | Status: AC | PRN
  Administered 2018-07-04: 7.5 mL via INTRAVENOUS

## 2018-07-05 ENCOUNTER — Other Ambulatory Visit: Payer: Self-pay

## 2018-07-05 DIAGNOSIS — R1013 Epigastric pain: Secondary | ICD-10-CM

## 2018-07-05 DIAGNOSIS — R112 Nausea with vomiting, unspecified: Secondary | ICD-10-CM

## 2018-07-05 NOTE — Progress Notes (Signed)
I have faxed the forms as requested.      Pt is scheduled for endo on Monday and needs her forms faxed to [phone]   Attn: Derwood Kaplan (husband)

## 2018-07-08 ENCOUNTER — Ambulatory Visit (AMBULATORY_SURGERY_CENTER): Payer: No Typology Code available for payment source | Admitting: Gastroenterology

## 2018-07-08 ENCOUNTER — Encounter: Payer: Self-pay | Admitting: Gastroenterology

## 2018-07-08 VITALS — BP 119/74 | HR 63 | Temp 98.4°F | Resp 11 | Ht 66.0 in | Wt 169.0 lb

## 2018-07-08 DIAGNOSIS — K219 Gastro-esophageal reflux disease without esophagitis: Secondary | ICD-10-CM

## 2018-07-08 DIAGNOSIS — K3189 Other diseases of stomach and duodenum: Secondary | ICD-10-CM

## 2018-07-08 DIAGNOSIS — R935 Abnormal findings on diagnostic imaging of other abdominal regions, including retroperitoneum: Secondary | ICD-10-CM

## 2018-07-08 DIAGNOSIS — R1013 Epigastric pain: Secondary | ICD-10-CM

## 2018-07-08 DIAGNOSIS — K259 Gastric ulcer, unspecified as acute or chronic, without hemorrhage or perforation: Secondary | ICD-10-CM

## 2018-07-08 DIAGNOSIS — K2970 Gastritis, unspecified, without bleeding: Secondary | ICD-10-CM

## 2018-07-08 MED ORDER — SODIUM CHLORIDE 0.9 % IV SOLN: 500.0000 mL | Freq: Once | INTRAVENOUS | Status: DC

## 2018-07-08 MED ORDER — PANTOPRAZOLE SODIUM 40 MG PO TBEC: 40.0000 mg | DELAYED_RELEASE_TABLET | Freq: Two times a day (BID) | ORAL | 0 refills | Status: DC

## 2018-07-08 NOTE — Op Note (Signed)
[name] Patient Name: [name] [name] Procedure Date: 07/08/2018 8:30 AM MRN: [id number] Endoscopist: Thornton Park MD, MD Age: 37 Referring MD:  Date of Birth: Sep 02, 1980 Gender: Female Account #: [id number] Procedure:                Upper GI endoscopy Indications:              Epigastric abdominal pain, Nausea responding to                            pantoprazole 40 mg daily. Allergic to Carafate                            Carafate. Medicines:                See the Anesthesia note for documentation of the                            administered medications Procedure:                Pre-Anesthesia Assessment:                           - Prior to the procedure, a History and Physical                            was performed, and patient medications and                            allergies were reviewed. The patient's tolerance of                            previous anesthesia was also reviewed. The risks                            and benefits of the procedure and the sedation                            options and risks were discussed with the patient.                            All questions were answered, and informed consent                            was obtained. Prior Anticoagulants: The patient has                            taken no previous anticoagulant or antiplatelet                            agents. ASA Grade Assessment: II - A patient with                            mild systemic disease. After reviewing the risks  and benefits, the patient was deemed in                            satisfactory condition to undergo the procedure.                           After obtaining informed consent, the endoscope was                            passed under direct vision. Throughout the                            procedure, the patient's blood pressure, pulse, and                            oxygen saturations were monitored continuously.  The                            Model GIF-HQ190 [id number] scope was introduced                            through the mouth, and advanced to the third part                            of duodenum. The upper GI endoscopy was                            accomplished without difficulty. The patient                            tolerated the procedure well. Scope In: Scope Out: Findings:                 The examined esophagus was normal. Biopsies were                            taken with a cold forceps for histology.                           A few small erosions were found in the gastric                            body. There were no stigmata of recent bleeding.                            Biopsies were taken with a cold forceps for                            histology.                           The examined duodenum was normal. Biopsies were                            taken with a cold forceps  for histology. Complications:            No immediate complications. Estimated Blood Loss:     Estimated blood loss: none. Impression:               - Normal esophagus. Biopsied.                           - Erosive gastropathy. Biopsied.                           - Normal examined duodenum. Biopsied. Recommendation:           - Await pathology results.                           - Advance diet as tolerated today.                           - Avoid all NSAIDs.                           - Use Protonix (pantoprazole) 40 mg PO BID for 8                            weeks.                           -Follow-up in the office with PA lemon or Dr.                            Tarri Glenn in 4 weeks. Thornton Park MD, MD 07/08/2018 8:45:39 AM This report has been signed electronically.

## 2018-07-08 NOTE — Progress Notes (Signed)
Called to room to assist during endoscopic procedure.  Patient ID and intended procedure confirmed with present staff. Received instructions for my participation in the procedure from the performing physician.  

## 2018-07-08 NOTE — Patient Instructions (Signed)
AVOID NSAIDS ( ASPRIN, IBUPROFEN ALEVE, NAPROXEN), YOU MAY USE TYLENOL IF NEEDED  FOLLOW UP IN OFFICE WITH PA LEMON OR DR BEAVERS IN 4 WEEKS  YOU HAD AN ENDOSCOPIC PROCEDURE TODAY AT THE [facility]:   Refer to the procedure report that was given to you for any specific questions about what was found during the examination.  If the procedure report does not answer your questions, please call your gastroenterologist to clarify.  If you requested that your care partner not be given the details of your procedure findings, then the procedure report has been included in a sealed envelope for you to review at your convenience later.  YOU SHOULD EXPECT: Some feelings of bloating in the abdomen. Passage of more gas than usual.  Walking can help get rid of the air that was put into your GI tract during the procedure and reduce the bloating. If you had a lower endoscopy (such as a colonoscopy or flexible sigmoidoscopy) you may notice spotting of blood in your stool or on the toilet paper. If you underwent a bowel prep for your procedure, you may not have a normal bowel movement for a few days.  Please Note:  You might notice some irritation and congestion in your nose or some drainage.  This is from the oxygen used during your procedure.  There is no need for concern and it should clear up in a day or so.  SYMPTOMS TO REPORT IMMEDIATELY:   Following upper endoscopy (EGD)  Vomiting of blood or coffee ground material  New chest pain or pain under the shoulder blades  Painful or persistently difficult swallowing  New shortness of breath  Fever of 100F or higher  Black, tarry-looking stools  For urgent or emergent issues, a gastroenterologist can be reached at any hour by calling [phone].   DIET:  We do recommend a small meal at first, but then you may proceed to your regular diet.  Drink plenty of fluids but you should avoid alcoholic beverages for 24 hours.  ACTIVITY:  You should  plan to take it easy for the rest of today and you should NOT DRIVE or use heavy machinery until tomorrow (because of the sedation medicines used during the test).    FOLLOW UP: Our staff will call the number listed on your records the next business day following your procedure to check on you and address any questions or concerns that you may have regarding the information given to you following your procedure. If we do not reach you, we will leave a message.  However, if you are feeling well and you are not experiencing any problems, there is no need to return our call.  We will assume that you have returned to your regular daily activities without incident.  If any biopsies were taken you will be contacted by phone or by letter within the next 1-3 weeks.  Please call us at [phone] if you have not heard about the biopsies in 3 weeks.    SIGNATURES/CONFIDENTIALITY: You and/or your care partner have signed paperwork which will be entered into your electronic medical record.  These signatures attest to the fact that that the information above on your After Visit Summary has been reviewed and is understood.  Full responsibility of the confidentiality of this discharge information lies with you and/or your care-partner.

## 2018-07-08 NOTE — Progress Notes (Signed)
PT taken to PACU. Monitors in place. VSS. Report given to RN. 

## 2018-07-08 NOTE — Progress Notes (Signed)
Pt's states no medical or surgical changes since previsit or office visit. 

## 2018-07-09 ENCOUNTER — Telehealth: Payer: Self-pay | Admitting: *Deleted

## 2018-07-09 ENCOUNTER — Other Ambulatory Visit: Payer: Self-pay

## 2018-07-09 ENCOUNTER — Other Ambulatory Visit: Payer: Self-pay | Admitting: Family Medicine

## 2018-07-09 DIAGNOSIS — M542 Cervicalgia: Secondary | ICD-10-CM

## 2018-07-09 DIAGNOSIS — Z7184 Encounter for health counseling related to travel: Secondary | ICD-10-CM

## 2018-07-09 DIAGNOSIS — K259 Gastric ulcer, unspecified as acute or chronic, without hemorrhage or perforation: Secondary | ICD-10-CM

## 2018-07-09 MED ORDER — ONDANSETRON HCL 4 MG PO TABS: 4.0000 mg | ORAL_TABLET | Freq: Three times a day (TID) | ORAL | 0 refills | Status: DC | PRN

## 2018-07-09 MED ORDER — PANTOPRAZOLE SODIUM 40 MG PO TBEC: 40.0000 mg | DELAYED_RELEASE_TABLET | Freq: Two times a day (BID) | ORAL | 0 refills | Status: DC

## 2018-07-09 NOTE — Telephone Encounter (Signed)
First attempt, no answer- left VM.  

## 2018-07-09 NOTE — Telephone Encounter (Signed)
No answer, message left for the patient. 

## 2018-07-09 NOTE — Telephone Encounter (Signed)
Ok to refill do not see where it was a given regularly.

## 2018-07-15 ENCOUNTER — Encounter: Payer: Self-pay | Admitting: Gastroenterology

## 2018-07-20 ENCOUNTER — Other Ambulatory Visit: Payer: Self-pay | Admitting: Physician Assistant

## 2018-07-24 ENCOUNTER — Other Ambulatory Visit: Payer: Self-pay

## 2018-07-24 NOTE — Progress Notes (Signed)
Contraindication warning populated for Cymbalta and yellow dye allergy. Called pt to double check that she has been taking medication with no side effects. Left VM to call the office. Rx pending.

## 2018-07-26 ENCOUNTER — Other Ambulatory Visit: Payer: Self-pay | Admitting: Family Medicine

## 2018-07-26 ENCOUNTER — Encounter: Payer: Self-pay | Admitting: Sports Medicine

## 2018-07-26 ENCOUNTER — Other Ambulatory Visit: Payer: Self-pay

## 2018-07-26 DIAGNOSIS — Z7184 Encounter for health counseling related to travel: Secondary | ICD-10-CM

## 2018-07-26 DIAGNOSIS — M542 Cervicalgia: Secondary | ICD-10-CM

## 2018-07-27 MED ORDER — DULOXETINE HCL 30 MG PO CPEP: ORAL_CAPSULE | ORAL | 1 refills | Status: DC

## 2018-07-27 NOTE — Progress Notes (Signed)
See MyChart message

## 2018-07-28 ENCOUNTER — Other Ambulatory Visit: Payer: Self-pay

## 2018-07-28 DIAGNOSIS — Z7184 Encounter for health counseling related to travel: Secondary | ICD-10-CM

## 2018-07-29 MED ORDER — ONDANSETRON HCL 4 MG PO TABS: 4.0000 mg | ORAL_TABLET | Freq: Three times a day (TID) | ORAL | 0 refills | Status: DC | PRN

## 2018-07-30 NOTE — Progress Notes (Signed)
Original letter was marked as deleted, It was retrieved and resent to patient. It was then scanned so that it will show up under 'Media'.

## 2018-07-30 NOTE — Progress Notes (Deleted)
Referring Provider: Briscoe Deutscher, DO Primary Care Physician:  Briscoe Deutscher, DO   Chief complaint: Follow-up regarding epigastric pain   IMPRESSION:  Epigastric pain with nausea and and vomiting    - similar symptoms in 2016 attributed to passed bile duct stone following ERCP evalation    -Treated with pantoprazole 40 mg twice daily, Carafate, and Zofran    - temporary relief with GI cocktail    - EGD 07/08/18: reflux esophagitis, gastropathy, no H pylori, duodenopathy Abnormal bile ducts on imaging    - MRI 08/22/15: mild intra and extrahepatic biliary dilatation, common hepatic duct measures 12 mm, no stones    - CT 07/02/18: Intra-and extrahepatic biliary dilatation with common bile duct measuring up to 13 mm on CT 07/02/18, slight increase since 2016    - MRI 07/04/18: mild to moderate intrahepatic biliary dilatation with common hepatic duct of 1.0 cm in diameter and CBD 58mm, no filling defect Elevated lipase    - 06/28/18: 94 Cholecystectomy Intolerance to Carafate - ? reaction    PLAN: ***   HPI: [name] [name] is a 37 y.o. female who returns in follow-up after her recent consultation.    Quality: Amount: Duration: Timing: Progression: Chronicity: Context: Similar prior episodes: Relieved by: Worsened by: Effective treatments: Ineffective treatments: Associated symptoms: Risks factors:   Past Medical History:  Diagnosis Date  . ADHD (attention deficit hyperactivity disorder)   . Anxiety   . Depression   . Fibromyalgia   . Gallstones   . Gestational diabetes   . History of chicken pox   . History of condyloma acuminatum   . Migraines     Past Surgical History:  Procedure Laterality Date  . CESAREAN SECTION  06/25/2011  . CESAREAN SECTION N/A 05/04/2015  . CHOLECYSTECTOMY N/A 07/07/2015  . ERCP N/A 08/24/2015  . KNEE CARTILAGE SURGERY Left     Current Outpatient Medications  Medication Sig Dispense Refill  . albuterol (PROVENTIL HFA;VENTOLIN  HFA) 108 (90 Base) MCG/ACT inhaler Inhale 2 puffs into the lungs every 6 (six) hours as needed for wheezing or shortness of breath. (Patient not taking: Reported on 07/08/2018) 1 Inhaler 0  . ALPRAZolam (XANAX) 0.5 MG tablet Take 0.5 mg by mouth 4 (four) times daily as needed for anxiety.   5  . amphetamine-dextroamphetamine (ADDERALL) 20 MG tablet Take 20 mg by mouth 3 (three) times daily.    . butalbital-acetaminophen-caffeine (FIORICET, ESGIC) 50-325-40 MG tablet Take 1 tablet by mouth every 6 (six) hours as needed for headache. 20 tablet 0  . cetirizine (ZYRTEC) 10 MG tablet Take 10 mg by mouth daily.    . cyclobenzaprine (FLEXERIL) 5 MG tablet TAKE 1 TABLET (5 MG TOTAL) BY MOUTH DAILY AS NEEDED FOR MUSCLE SPASMS. 20 tablet 0  . DULoxetine (CYMBALTA) 30 MG capsule TAKE 1 CAPSULE BY MOUTH EVERY DAY 90 capsule 1  . MICROGESTIN FE 1/20 1-20 MG-MCG tablet Take 1 tablet by mouth daily.  11  . ondansetron (ZOFRAN) 4 MG tablet Take 1 tablet (4 mg total) by mouth every 8 (eight) hours as needed for nausea or vomiting. 20 tablet 0  . oxyCODONE-acetaminophen (PERCOCET/ROXICET) 5-325 MG tablet Take 1 tablet by mouth every 8 (eight) hours as needed for severe pain. 15 tablet 0  . pantoprazole (PROTONIX) 40 MG tablet Take 1 tablet (40 mg total) by mouth 2 (two) times daily. Use Protonix 40 mg twice a day for 8 weeks. 120 tablet 0   No current facility-administered medications for this visit.  Allergies as of 07/31/2018 - Review Complete 07/08/2018  Allergen Reaction Noted  . Ibuprofen Hives and Other (See Comments) 06/22/2011  . Toradol [ketorolac tromethamine] Hives 01/27/2018  . Carafate [sucralfate] Itching 07/01/2018  . Food  05/14/2011  . Latex Hives 04/23/2015  . Lovenox [enoxaparin sodium] Other (See Comments) 07/08/2015  . Nsaids Hives 01/08/2018  . Other Hives 09/01/2015  . Penicillins Hives 11/09/2009  . Yellow dyes (non-tartrazine) Hives 09/01/2015    Family History  Problem  Relation Age of Onset  . Hypertension Mother   . Migraines Mother   . Hypertension Father   . Migraines Sister   . Heart disease Maternal Grandmother   . Heart disease Maternal Grandfather   . Diabetes Paternal Grandmother   . Prostate cancer Paternal Grandfather   . Esophageal cancer Paternal Grandfather   . Healthy Son   . Healthy Daughter   . Anesthesia problems Neg Hx   . Hypotension Neg Hx   . Malignant hyperthermia Neg Hx   . Pseudochol deficiency Neg Hx   . Colon cancer Neg Hx     Social History   Socioeconomic History  . Marital status: Married    Spouse name: Not on file  . Number of children: 2  . Years of education: Not on file  . Highest education level: Not on file  Occupational History  . Occupation: Doesn't work  Scientific laboratory technician  . Financial resource strain: Not on file  . Food insecurity:    Worry: Not on file    Inability: Not on file  . Transportation needs:    Medical: Not on file    Non-medical: Not on file  Tobacco Use  . Smoking status: Current Some Day Smoker  . Smokeless tobacco: Never Used  . Tobacco comment: smokes socially (1 cigarette a week)  Substance and Sexual Activity  . Alcohol use: No    Alcohol/week: 0.0 standard drinks  . Drug use: No  . Sexual activity: Yes  Lifestyle  . Physical activity:    Days per week: Not on file    Minutes per session: Not on file  . Stress: Not on file  Relationships  . Social connections:    Talks on phone: Not on file    Gets together: Not on file    Attends religious service: Not on file    Active member of club or organization: Not on file    Attends meetings of clubs or organizations: Not on file    Relationship status: Not on file  . Intimate partner violence:    Fear of current or ex partner: Not on file    Emotionally abused: Not on file    Physically abused: Not on file    Forced sexual activity: Not on file  Other Topics Concern  . Not on file  Social History Narrative   2012-  daughter   2016- son   Married   Does not work.  She was previously a Emergency planning/management officer, last worked in 2012.    Enjoys reading, shopping, beach    Review of Systems: 12 system ROS is negative except as noted above.  There were no vitals filed for this visit.  Physical Exam: Vital signs were reviewed. General:   Alert, well-nourished, pleasant and cooperative in NAD Head:  Normocephalic and atraumatic. Eyes:  Sclera clear, no icterus.   Conjunctiva pink. Mouth:  No deformity or lesions.   Neck:  Supple; no thyromegaly. Lungs:  Clear throughout to auscultation.   No  wheezes.  Heart:  Regular rate and rhythm; no murmurs Abdomen:  Soft, nontender, normal bowel sounds. No rebound or guarding. No hepatosplenomegaly Rectal:  Deferred  Msk:  Symmetrical without gross deformities. Extremities:  No gross deformities or edema. Neurologic:  Alert and  oriented x4;  grossly nonfocal Skin:  No rash or bruise. Psych:  Alert and cooperative. Normal mood and affect.   Jaymarion Trombly L. Tarri Glenn, MD, MPH [facility] 07/30/2018, 9:59 AM

## 2018-07-31 ENCOUNTER — Ambulatory Visit: Payer: No Typology Code available for payment source | Admitting: Gastroenterology

## 2018-08-25 ENCOUNTER — Other Ambulatory Visit: Payer: Self-pay | Admitting: Family Medicine

## 2018-08-25 ENCOUNTER — Other Ambulatory Visit: Payer: Self-pay

## 2018-08-25 DIAGNOSIS — Z7184 Encounter for health counseling related to travel: Secondary | ICD-10-CM

## 2018-08-25 DIAGNOSIS — M542 Cervicalgia: Secondary | ICD-10-CM

## 2018-08-26 MED ORDER — ONDANSETRON HCL 4 MG PO TABS: 4.0000 mg | ORAL_TABLET | Freq: Three times a day (TID) | ORAL | 0 refills | Status: DC | PRN

## 2018-08-27 MED ORDER — BUTALBITAL-APAP-CAFFEINE 50-325-40 MG PO TABS: 1.0000 | ORAL_TABLET | Freq: Four times a day (QID) | ORAL | 0 refills | Status: DC | PRN

## 2018-08-27 MED ORDER — CYCLOBENZAPRINE HCL 5 MG PO TABS: 5.0000 mg | ORAL_TABLET | Freq: Every day | ORAL | 0 refills | Status: DC | PRN

## 2018-08-27 NOTE — Telephone Encounter (Signed)
Ok to fill due for f/u last given 2 mo ago #20 no rf.

## 2018-08-27 NOTE — Telephone Encounter (Signed)
Has not been seen by you last app was 8/19. Has seen Paulla Fore.

## 2018-08-28 ENCOUNTER — Encounter: Payer: Self-pay | Admitting: Family Medicine

## 2018-08-29 ENCOUNTER — Other Ambulatory Visit: Payer: Self-pay

## 2018-08-29 MED ORDER — BUTALBITAL-APAP-CAFFEINE 50-325-40 MG PO TABS: 1.0000 | ORAL_TABLET | Freq: Four times a day (QID) | ORAL | 0 refills | Status: DC | PRN

## 2018-09-13 ENCOUNTER — Other Ambulatory Visit: Payer: Self-pay | Admitting: Family Medicine

## 2018-09-13 DIAGNOSIS — M542 Cervicalgia: Secondary | ICD-10-CM

## 2018-09-26 ENCOUNTER — Encounter: Payer: Self-pay | Admitting: Family Medicine

## 2018-09-26 ENCOUNTER — Encounter: Payer: Self-pay | Admitting: Physician Assistant

## 2018-09-26 NOTE — Telephone Encounter (Signed)
Please advise 

## 2018-09-27 ENCOUNTER — Encounter: Payer: Self-pay | Admitting: Physician Assistant

## 2018-09-27 ENCOUNTER — Other Ambulatory Visit: Payer: Self-pay

## 2018-09-27 ENCOUNTER — Ambulatory Visit (INDEPENDENT_AMBULATORY_CARE_PROVIDER_SITE_OTHER): Payer: No Typology Code available for payment source | Admitting: Physician Assistant

## 2018-09-27 ENCOUNTER — Other Ambulatory Visit: Payer: Self-pay | Admitting: Physician Assistant

## 2018-09-27 VITALS — BP 136/80 | HR 94 | Temp 98.6°F | Ht 66.0 in | Wt 163.2 lb

## 2018-09-27 DIAGNOSIS — R6889 Other general symptoms and signs: Secondary | ICD-10-CM

## 2018-09-27 DIAGNOSIS — Z7184 Encounter for health counseling related to travel: Secondary | ICD-10-CM

## 2018-09-27 LAB — POC INFLUENZA A&B (BINAX/QUICKVUE)
Influenza A, POC: NEGATIVE
Influenza B, POC: NEGATIVE

## 2018-09-27 MED ORDER — OSELTAMIVIR PHOSPHATE 75 MG PO CAPS: 75.0000 mg | ORAL_CAPSULE | Freq: Every day | ORAL | 0 refills | Status: DC

## 2018-09-27 NOTE — Progress Notes (Signed)
[name] [name] is a 38 y.o. female here for a new problem.  I acted as a Education administrator for [employer], PA-C Anselmo Pickler, LPN  History of Present Illness:   Chief Complaint  Patient presents with  . Cough    Cough  This is a new problem. Episode onset: Started Wed, Son was diagnosed with Flu yesterday. The problem has been gradually worsening. The problem occurs every few hours. The cough is non-productive. Associated symptoms include chills, ear congestion, ear pain, headaches, nasal congestion, postnasal drip, a sore throat and shortness of breath. Pertinent negatives include no fever. Associated symptoms comments: Fatigue, body aches. The symptoms are aggravated by lying down. Risk factors: Son Dx with Flu  The treatment provided no relief. Her past medical history is significant for pneumonia. There is no history of asthma or bronchitis.    Past Medical History:  Diagnosis Date  . ADHD (attention deficit hyperactivity disorder)   . Anxiety   . Depression   . Fibromyalgia   . Gallstones   . Gestational diabetes   . History of chicken pox   . History of condyloma acuminatum   . Migraines      Social History   Socioeconomic History  . Marital status: Married    Spouse name: Not on file  . Number of children: 2  . Years of education: Not on file  . Highest education level: Not on file  Occupational History  . Occupation: Doesn't work  Scientific laboratory technician  . Financial resource strain: Not on file  . Food insecurity:    Worry: Not on file    Inability: Not on file  . Transportation needs:    Medical: Not on file    Non-medical: Not on file  Tobacco Use  . Smoking status: Current Some Day Smoker  . Smokeless tobacco: Never Used  . Tobacco comment: smokes socially (1 cigarette a week)  Substance and Sexual Activity  . Alcohol use: No    Alcohol/week: 0.0 standard drinks  . Drug use: No  . Sexual activity: Yes  Lifestyle  . Physical activity:    Days per week: Not on  file    Minutes per session: Not on file  . Stress: Not on file  Relationships  . Social connections:    Talks on phone: Not on file    Gets together: Not on file    Attends religious service: Not on file    Active member of club or organization: Not on file    Attends meetings of clubs or organizations: Not on file    Relationship status: Not on file  . Intimate partner violence:    Fear of current or ex partner: Not on file    Emotionally abused: Not on file    Physically abused: Not on file    Forced sexual activity: Not on file  Other Topics Concern  . Not on file  Social History Narrative   2012- daughter   2016- son   Married   Does not work.  She was previously a Emergency planning/management officer, last worked in 2012.    Enjoys reading, shopping, beach    Past Surgical History:  Procedure Laterality Date  . CESAREAN SECTION  06/25/2011  . CESAREAN SECTION N/A 05/04/2015  . CHOLECYSTECTOMY N/A 07/07/2015  . ERCP N/A 08/24/2015  . KNEE CARTILAGE SURGERY Left     Family History  Problem Relation Age of Onset  . Hypertension Mother   . Migraines Mother   .  Hypertension Father   . Migraines Sister   . Heart disease Maternal Grandmother   . Heart disease Maternal Grandfather   . Diabetes Paternal Grandmother   . Prostate cancer Paternal Grandfather   . Esophageal cancer Paternal Grandfather   . Healthy Son   . Healthy Daughter   . Anesthesia problems Neg Hx   . Hypotension Neg Hx   . Malignant hyperthermia Neg Hx   . Pseudochol deficiency Neg Hx   . Colon cancer Neg Hx     Allergies  Allergen Reactions  . Ibuprofen Hives and Other (See Comments)    stuffiness  . Toradol [Ketorolac Tromethamine] Hives  . Carafate [Sucralfate] Itching  . Food     Nuts and Yeast  . Latex Hives  . Lovenox [Enoxaparin Sodium] Other (See Comments)    Face flush and redness  . Nsaids Hives  . Other Hives    CATS  . Penicillins Hives  . Yellow Dyes (Non-Tartrazine) Hives    YELLOW No. 5     Current Medications:   Current Outpatient Medications:  .  ALPRAZolam (XANAX) 0.5 MG tablet, Take 0.5 mg by mouth 4 (four) times daily as needed for anxiety. , Disp: , Rfl: 5 .  amphetamine-dextroamphetamine (ADDERALL) 20 MG tablet, Take 20 mg by mouth 3 (three) times daily., Disp: , Rfl:  .  butalbital-acetaminophen-caffeine (FIORICET, ESGIC) 50-325-40 MG tablet, Take 1 tablet by mouth every 6 (six) hours as needed for headache., Disp: 20 tablet, Rfl: 0 .  cetirizine (ZYRTEC) 10 MG tablet, Take 10 mg by mouth daily., Disp: , Rfl:  .  cyclobenzaprine (FLEXERIL) 5 MG tablet, TAKE 1 TABLET (5 MG TOTAL) BY MOUTH DAILY AS NEEDED FOR MUSCLE SPASMS., Disp: 20 tablet, Rfl: 0 .  DULoxetine (CYMBALTA) 30 MG capsule, TAKE 1 CAPSULE BY MOUTH EVERY DAY, Disp: 90 capsule, Rfl: 1 .  MICROGESTIN FE 1/20 1-20 MG-MCG tablet, Take 1 tablet by mouth daily., Disp: , Rfl: 11 .  ondansetron (ZOFRAN) 4 MG tablet, Take 1 tablet (4 mg total) by mouth every 8 (eight) hours as needed for nausea or vomiting., Disp: 20 tablet, Rfl: 0 .  pantoprazole (PROTONIX) 40 MG tablet, Take 1 tablet (40 mg total) by mouth 2 (two) times daily. Use Protonix 40 mg twice a day for 8 weeks., Disp: 120 tablet, Rfl: 0 .  albuterol (PROVENTIL HFA;VENTOLIN HFA) 108 (90 Base) MCG/ACT inhaler, Inhale 2 puffs into the lungs every 6 (six) hours as needed for wheezing or shortness of breath. (Patient not taking: Reported on 07/08/2018), Disp: 1 Inhaler, Rfl: 0 .  oseltamivir (TAMIFLU) 75 MG capsule, Take 1 capsule (75 mg total) by mouth daily. (Patient not taking: Reported on 09/27/2018), Disp: 10 capsule, Rfl: 0   Review of Systems:   Review of Systems  Constitutional: Positive for chills. Negative for fever.  HENT: Positive for ear pain, postnasal drip and sore throat.   Respiratory: Positive for cough and shortness of breath.   Neurological: Positive for headaches.    Vitals:   Vitals:   09/27/18 1255  BP: 136/80  Pulse: 94  Temp:  98.6 F (37 C)  TempSrc: Oral  SpO2: 97%  Weight: 163 lb 4 oz (74 kg)  Height: 5\' 6"  (1.676 m)     Body mass index is 26.35 kg/m.  Physical Exam:   Physical Exam Vitals signs and nursing note reviewed.  Constitutional:      General: She is not in acute distress.    Appearance: She is well-developed.  She is not ill-appearing or toxic-appearing.  HENT:     Head: Normocephalic and atraumatic.     Right Ear: Tympanic membrane, ear canal and external ear normal. Tympanic membrane is not erythematous, retracted or bulging.     Left Ear: Tympanic membrane, ear canal and external ear normal. Tympanic membrane is not erythematous, retracted or bulging.     Nose: Nose normal.     Right Sinus: No maxillary sinus tenderness or frontal sinus tenderness.     Left Sinus: No maxillary sinus tenderness or frontal sinus tenderness.     Mouth/Throat:     Pharynx: Uvula midline. Posterior oropharyngeal erythema present.     Tonsils: Swelling: 0 on the right. 0 on the left.  Eyes:     General: Lids are normal.     Conjunctiva/sclera: Conjunctivae normal.  Neck:     Trachea: Trachea normal.  Cardiovascular:     Rate and Rhythm: Normal rate and regular rhythm.     Heart sounds: Normal heart sounds, S1 normal and S2 normal.  Pulmonary:     Effort: Pulmonary effort is normal.     Breath sounds: Normal breath sounds. No decreased breath sounds, wheezing, rhonchi or rales.  Lymphadenopathy:     Cervical: No cervical adenopathy.  Skin:    General: Skin is warm and dry.  Neurological:     Mental Status: She is alert.  Psychiatric:        Speech: Speech normal.        Behavior: Behavior normal. Behavior is cooperative.       Assessment and Plan:   Anhelica was seen today for cough.  Diagnoses and all orders for this visit:  Flu-like symptoms -     POC Influenza A&B(BINAX/QUICKVUE)   No red flags on exam.  Flu test negative. After shared decision making, she is undecided if she is going  to start the Tamiflu. Reviewed return precautions including worsening fever, SOB, worsening cough or other concerns. Push fluids and rest. I recommend that patient follow-up if symptoms worsen or persist despite treatment x 7-10 days, sooner if needed.  . Reviewed expectations re: course of current medical issues. . Discussed self-management of symptoms. . Outlined signs and symptoms indicating need for more acute intervention. . Patient verbalized understanding and all questions were answered. . See orders for this visit as documented in the electronic medical record. . Patient received an After-Visit Summary.  CMA or LPN served as scribe during this visit. History, Physical, and Plan performed by medical provider. The above documentation has been reviewed and is accurate and complete.   Inda Coke, PA-C

## 2018-09-27 NOTE — Patient Instructions (Signed)
It was great to see you!  You have a viral upper respiratory infection. Antibiotics are not needed for this.  Viral infections usually take 7-10 days to resolve.  The cough can last a few weeks to go away.  You do not have the flu. You may take Tamiflu if you'd like, however please remember our discussion that it may cause you to feel worse (headaches and nausea.)  Push fluids and get plenty of rest. Please return if you are not improving as expected, or if you have high fevers (>101.5) or difficulty swallowing or worsening productive cough.  Call clinic with questions.  I hope you start feeling better soon!

## 2018-09-30 MED ORDER — ONDANSETRON HCL 4 MG PO TABS: 4.0000 mg | ORAL_TABLET | Freq: Three times a day (TID) | ORAL | 0 refills | Status: DC | PRN

## 2018-10-01 ENCOUNTER — Telehealth: Payer: Self-pay | Admitting: Sports Medicine

## 2018-10-01 ENCOUNTER — Encounter: Payer: Self-pay | Admitting: Sports Medicine

## 2018-10-01 ENCOUNTER — Ambulatory Visit: Payer: No Typology Code available for payment source | Admitting: Sports Medicine

## 2018-10-01 ENCOUNTER — Other Ambulatory Visit: Payer: Self-pay | Admitting: Family Medicine

## 2018-10-01 VITALS — BP 120/82 | HR 98 | Ht 66.0 in | Wt 163.0 lb

## 2018-10-01 DIAGNOSIS — M9902 Segmental and somatic dysfunction of thoracic region: Secondary | ICD-10-CM

## 2018-10-01 DIAGNOSIS — M9907 Segmental and somatic dysfunction of upper extremity: Secondary | ICD-10-CM

## 2018-10-01 DIAGNOSIS — M9901 Segmental and somatic dysfunction of cervical region: Secondary | ICD-10-CM

## 2018-10-01 DIAGNOSIS — G540 Brachial plexus disorders: Secondary | ICD-10-CM

## 2018-10-01 DIAGNOSIS — M542 Cervicalgia: Secondary | ICD-10-CM | POA: Diagnosis not present

## 2018-10-01 DIAGNOSIS — M25511 Pain in right shoulder: Secondary | ICD-10-CM

## 2018-10-01 DIAGNOSIS — M7918 Myalgia, other site: Secondary | ICD-10-CM | POA: Diagnosis not present

## 2018-10-01 DIAGNOSIS — M62830 Muscle spasm of back: Secondary | ICD-10-CM

## 2018-10-01 DIAGNOSIS — M9908 Segmental and somatic dysfunction of rib cage: Secondary | ICD-10-CM

## 2018-10-01 MED ORDER — TIZANIDINE HCL 4 MG PO TABS: 4.0000 mg | ORAL_TABLET | Freq: Three times a day (TID) | ORAL | 1 refills | Status: DC

## 2018-10-01 NOTE — Progress Notes (Signed)
[name] [name]. [name] [name], [place] at [facility] [phone]  [name] [name] - 38 y.o. female MRN [id number]  Date of birth: 13-Sep-1980  Visit Date: October 01, 2018  PCP: Briscoe Deutscher, DO   Referred by: Briscoe Deutscher, DO  SUBJECTIVE:  Chief Complaint  Patient presents with  . Right Shoulder - Initial Assessment    Pain x 1 month, stiffness, radiating down arm. Unable to take NSAID's. [name] [name] has helped. Has tried Prednisone. XR C-spine 08/15/17.   . Initial Assessment    Thoracic outlet syndrome    HPI: Patient reports worsening right shoulder and arm pain that is been progressive over the last 1 month.  She is recently quit working and has been doing well until last month.  She is not able take anti-inflammatories due to hives and allergies including swelling.  She has been taking cyclobenzaprine and intermittent prednisone without significant improvement.  She previously is been diagnosed with thoracic outlet syndrome but has not been able to adequately stretch her shoulder due to the pain.  Is described more as a stiffness.  She has swelling in her right hand as well as weakness.  Any type of motion seems to exacerbate it.  She is having nighttime disturbances due to this.  REVIEW OF SYSTEMS: Per HPI  HISTORY:  Prior history reviewed and updated per electronic medical record.  Patient Active Problem List   Diagnosis Date Noted  . Thoracic outlet syndrome 10/01/2018    Clinical diagnosis with underlying likely vascular component.   . Myofascial pain 09/04/2017  . Bilateral carpal tunnel syndrome 09/04/2017  . Ankle impingement syndrome, left 09/04/2017    Strong family history of foot issues   . Migraine with aura and without status migrainosus, not intractable 09/06/2015  . Anxiety and depression 11/05/2009  . Attention deficit disorder 11/05/2009   Social History   Occupational History  . Occupation: Doesn't work  Tobacco  Use  . Smoking status: Current Some Day Smoker  . Smokeless tobacco: Never Used  . Tobacco comment: smokes socially (1 cigarette a week)  Substance and Sexual Activity  . Alcohol use: No    Alcohol/week: 0.0 standard drinks  . Drug use: No  . Sexual activity: Yes   Social History   Social History Narrative   2012- daughter   2016- son   Married   Does not work.  She was previously a Emergency planning/management officer, last worked in 2012.    Enjoys reading, shopping, beach    OBJECTIVE:  VS:  HT:5\' 6"  (167.6 cm)   WT:163 lb (73.9 kg)  BMI:26.32    BP:120/82  HR:98bpm  TEMP: ( )  RESP:98 %   PHYSICAL EXAM: Adult female. No acute distress.  Alert and appropriate. Bilateral upper extremities with marked tightness within the right greater than left pectoralis muscles.  She has a small amount of swelling within the right hand that is not present on the left.  Markedly positive Allen test. Positive scapular dyskinesis.   ASSESSMENT:  1. Thoracic outlet syndrome   2. Right shoulder pain, unspecified chronicity   3. Myofascial pain   4. Cervicalgia   5. Muscle spasm of back   6. Somatic dysfunction of upper extremity   7. Somatic dysfunction of cervical region   8. Somatic dysfunction of thoracic region   9. Somatic dysfunction of rib cage region     PROCEDURES:  PROCEDURE NOTE: OSTEOPATHIC MANIPULATION  The decision today to treat with Osteopathic  Manipulative Therapy (OMT) was based on physical exam findings. Verbal consent was obtained following a discussion with the patient regarding the of risks, benefits and potential side effects, including an acute pain flare,post manipulation soreness and need for repeat treatments. Additionally, we specifically discussed the minimal risk of  injury to neurovascular structures associated with Cervical manipulation. Contraindications to OMT: NONE Manipulation was performed as below: [facility] & Osteopathic Exam Findings CERVICAL SPINE: OA -  rotated right C6 Flexed, rotated RIGHT, sidebent RIGHT THORACIC SPINE:  T2 - 6 Neutral, rotated RIGHT, sidebent LEFT RIBS:  Rib 1 Right  Exhalation dysfunction (elevated/inhaled) UPPER EXTREMITIES: Right pec minor tenderpoint Right supraspinatous tenderpoint Right trapezius tenderpoint  OMT Techniques Used: HVLA muscle energy myofascial release soft tissue  The patient tolerated the treatment well and reported Improved symptoms following treatment today. Patient was given medications, exercises, stretches and lifestyle modifications per AVS and verbally.      PLAN:  Pertinent additional documentation may be included in corresponding procedure notes, imaging studies, problem based documentation and patient instructions.  Thoracic outlet syndrome Should respond well to osteopathic manipulation and return to physical therapy for consideration of dry needling to the pectoralis musculature.  Trial of Zanaflex should be beneficial for underlying myofascial pain component.  Continue previously prescribed home exercise program.  and Referral to physical therapy placed today  Discussed the underlying features of tight hip flexors leading to crouched, fetal like position that results in spinal column compression.  Including lumbar hyperflexion with hypermobility, thoracic flexion with restrictive rotation and cervical lordosis reversal.   Osteopathic manipulation was performed today based on physical exam findings.  Patient has responded well to osteopathic manipulation previously the prior manipulation did not provide permanent long lasting relief.  The patient does feel as though there was significant benefit to the prior manipulation and they wished for repeat manipulation today.  They understand that home therapeutic exercises are critical part of the healing/treatment process and will continue with self treatment between now and their next visit as outlined.  The patient understands that  the frequency of visits is meant to provide a stimulus to promote the body's own ability to heal and is not meant to be the sole means for improvement in their symptoms.  Activity modifications and the importance of avoiding exacerbating activities (limiting pain to no more than a 4 / 10 during or following activity) recommended and discussed.  Discussed red flag symptoms that warrant earlier emergent evaluation and patient voices understanding.   Meds ordered this encounter  Medications  . tiZANidine (ZANAFLEX) 4 MG tablet    Sig: Take 1 tablet (4 mg total) by mouth 3 (three) times daily.    Dispense:  90 tablet    Refill:  1   Lab Orders  No laboratory test(s) ordered today   Imaging Orders  No imaging studies ordered today    Referral Orders     Ambulatory referral to Physical Therapy  At follow up will plan to consider: repeat osteopathic manipulation  Return in about 4 weeks (around 10/29/2018) for consideration of repeat Osteopathic Manipulation.          Gerda Diss, [facility] Physician

## 2018-10-01 NOTE — Assessment & Plan Note (Signed)
Should respond well to osteopathic manipulation and return to physical therapy for consideration of dry needling to the pectoralis musculature.

## 2018-10-01 NOTE — Telephone Encounter (Signed)
See note  Copied from [place] [phone]. Topic: General - Other >> Oct 01, 2018  5:15 PM Alanda Slim E wrote: Reason for CRM: Pt called stating she was experiencing a worsening pain around her neck and shoulders and wanted to know if it was normal and due to the manipulation she had done with Dr. Paulla Fore today or if it could be something else/ asked Pt if she took muscle relaxer's that were prescribed, she had not/ please advise

## 2018-10-02 ENCOUNTER — Encounter: Payer: Self-pay | Admitting: Sports Medicine

## 2018-10-02 MED ORDER — BUTALBITAL-APAP-CAFFEINE 50-325-40 MG PO TABS: 1.0000 | ORAL_TABLET | Freq: Four times a day (QID) | ORAL | 0 refills | Status: DC | PRN

## 2018-10-02 NOTE — Telephone Encounter (Addendum)
Spoke with pt and advised that it is normal to have some increased pain after OMT. She reports that she is feeling better this morning. She took the medication prescribed, Tizanidine, last night and experienced hives. She wants to give it one more try and she will let us know how she does with it tomorrow. She also notes that she has taken Cyclobenzaprine in the past without any significant side effects.   Forwarding to Dr. Paulla Fore as Juluis Rainier.

## 2018-10-02 NOTE — Telephone Encounter (Signed)
I am glad that she is feeling better this morning.  Post manipulation soreness is common.  If any worsening we can work her in otherwise keep follow-up as scheduled.

## 2018-10-02 NOTE — Telephone Encounter (Signed)
See MyChart message from pt.

## 2018-10-06 ENCOUNTER — Other Ambulatory Visit: Payer: Self-pay | Admitting: Family Medicine

## 2018-10-06 DIAGNOSIS — M542 Cervicalgia: Secondary | ICD-10-CM

## 2018-10-07 MED ORDER — CYCLOBENZAPRINE HCL 5 MG PO TABS: 5.0000 mg | ORAL_TABLET | Freq: Every day | ORAL | 0 refills | Status: DC | PRN

## 2018-10-23 ENCOUNTER — Ambulatory Visit: Payer: No Typology Code available for payment source | Attending: Sports Medicine | Admitting: Physical Therapy

## 2018-10-23 ENCOUNTER — Other Ambulatory Visit: Payer: Self-pay | Admitting: Family Medicine

## 2018-10-23 ENCOUNTER — Other Ambulatory Visit: Payer: Self-pay

## 2018-10-23 ENCOUNTER — Encounter: Payer: Self-pay | Admitting: Physical Therapy

## 2018-10-23 DIAGNOSIS — M25511 Pain in right shoulder: Secondary | ICD-10-CM | POA: Insufficient documentation

## 2018-10-23 DIAGNOSIS — G8929 Other chronic pain: Secondary | ICD-10-CM | POA: Diagnosis present

## 2018-10-23 DIAGNOSIS — M542 Cervicalgia: Secondary | ICD-10-CM

## 2018-10-23 DIAGNOSIS — M6281 Muscle weakness (generalized): Secondary | ICD-10-CM | POA: Diagnosis present

## 2018-10-23 NOTE — Therapy (Signed)
[facility] [address], [place], [place], [address] Phone: [phone]   Fax:  [phone]  Physical Therapy Evaluation  Patient Details  Name: [name] [name] MRN: [id number] Date of Birth: 10-18-1980 Referring Provider (PT): Gerda Diss, DO   Encounter Date: 10/23/2018  PT End of Session - 10/23/18 1128    Visit Number  1    Number of Visits  24    Date for PT Re-Evaluation  12/18/18    Authorization Type  [employer]    Authorization Time Period  09/28/17-10/27/18 (enter new dates next week)    Authorization - Number of Visits  24    PT Start Time  1015    PT Stop Time  1055    PT Time Calculation (min)  40 min    Activity Tolerance  Patient tolerated treatment well    Behavior During Therapy  [facility] for tasks assessed/performed       Past Medical History:  Diagnosis Date  . ADHD (attention deficit hyperactivity disorder)   . Anxiety   . Depression   . Fibromyalgia   . Gallstones   . Gestational diabetes   . History of chicken pox   . History of condyloma acuminatum   . Migraines     Past Surgical History:  Procedure Laterality Date  . CESAREAN SECTION  06/25/2011  . CESAREAN SECTION N/A 05/04/2015  . CHOLECYSTECTOMY N/A 07/07/2015  . ERCP N/A 08/24/2015  . KNEE CARTILAGE SURGERY Left     There were no vitals filed for this visit.   Subjective Assessment - 10/23/18 1018    Subjective  Pt has a history of Rt shoulder and UE symptoms approx 6 weeks ago.  Rt hand is swollen and goes numb with use.  Pt is Rt hand dominant.  Feels sharp shooting pain in forearm into hand.  Has been diagnosed with thoracic outlet syndrome in past but hasn't had it treated.  Seeking treatment now b/c pain has gotten severe.      Limitations  Lifting;Writing;House hold activities    Diagnostic tests  no    Patient Stated Goals  get rid of pain, be able to start strengthening, daily activities using dominant hand, hold  phone without numbness    Currently in Pain?  Yes    Pain Score  8     Pain Location  Shoulder    Pain Orientation  Right    Pain Descriptors / Indicators  Numbness;Sharp;Shooting;Tingling;Pins and needles;Aching;Burning    Pain Type  Chronic pain    Pain Onset  More than a month ago    Pain Frequency  Intermittent    Aggravating Factors   all activities using Rt UE/dominant hand    Pain Relieving Factors  nothing, tried hand brace but didn't help    Effect of Pain on Daily Activities  all activies using Rt domintant UE         [facility] PT Assessment - 10/23/18 0001      Assessment   Medical Diagnosis  M25.511 (ICD-10-CM) - Right shoulder pain, unspecified chronicity G54.0 (ICD-10-CM) - Thoracic outlet syndrome     Referring Provider (PT)  Gerda Diss, DO    Onset Date/Surgical Date  --   6 weeks ago   Hand Dominance  Right    Next MD Visit  --   mid-March   Prior Therapy  yes      Precautions   Precautions  None  Restrictions   Weight Bearing Restrictions  No      Balance Screen   Has the patient fallen in the past 6 months  Yes    How many times?  1    Has the patient had a decrease in activity level because of a fear of falling?   No    Is the patient reluctant to leave their home because of a fear of falling?   No      Home Film/video editor residence    Living Arrangements  Spouse/significant other;Children      Prior Function   Level of Independence  Independent    Vocation  Unemployed    Vocation Requirements  kids are 3.5 and 7    Leisure  read, dance, travel      Cognition   Overall Cognitive Status  Within Functional Limits for tasks assessed      Observation/Other Assessments   Observations  Mild diffuse swelling present Rt hand    Focus on Therapeutic Outcomes (FOTO)   45%   33%     Sensation   Light Touch  Impaired Detail    Light Touch Impaired Details  Impaired RUE   distal>proximal, mostly throughout hand and  all 5 fingers     Posture/Postural Control   Posture/Postural Control  Postural limitations    Postural Limitations  Forward head      ROM / Strength   AROM / PROM / Strength  Strength;AROM      AROM   Overall AROM Comments  grossly WNL throughout bil UEs but with Rt UE pain and numbness with overhead shoulder flexion and abduction   mild Lt UE numbness with overhead positions as well     Strength   Overall Strength  Deficits    Overall Strength Comments  grip strength Rt 15lb, Lt 75#, bil shoulder ext/abd/ER 4/5, poor scapular control Rt>Lt      Palpation   Spinal mobility  fair mobility lower c-spine flexion and upper thoracic spine extension    Palpation comment  tender: bil supraspinatus tendon, lats, pec minor, pec major, teres minor, teres major, posterior deltoid, supraspinatus, upper traps, subscapularis   Rt>Lt throughout     Special Tests    Special Tests  Thoracic Outlet Syndrome    Other special tests  ULTT: neural tension with symptom reproduction present ulnar and median > radial    Thoracic Outlet Syndrome   [employer] Test   Findings  Positive    Side   Right                Objective measurements completed on examination: See above findings.              PT Education - 10/23/18 1055    Education Details  Access Code: [email or web address]     Person(s) Educated  Patient    Methods  Explanation;Demonstration;Verbal cues;Handout    Comprehension  Verbalized understanding;Returned demonstration       PT Short Term Goals - 10/23/18 1118      PT SHORT TERM GOAL #1   Title  Pt will be ind in initial HEP    Time  4    Period  Weeks    Status  New    Target Date  11/20/18      PT SHORT TERM GOAL #2   Title  Pt will report reduction in Rt UE pain and numbness  by at least 20%    Time  4    Period  Weeks    Status  New    Target Date  11/20/18      PT SHORT TERM GOAL #3   Title  Pt will achieve Rt grip strength of at least 30lb to  improve functional use of dominant hand.    Time  4    Period  Weeks    Status  New    Target Date  11/20/18      PT SHORT TERM GOAL #4   Title  Pt will be able to perform light household activities including overhead reaching tasks with Rt UE with pain rating not to exceed 3/10.    Time  4    Period  Weeks    Status  New    Target Date  11/20/18        PT Long Term Goals - 10/23/18 1122      PT LONG TERM GOAL #1   Title  Pt will be ind in advanced HEP and understand how to safely progress activity.    Time  8    Period  Weeks    Status  New    Target Date  12/18/18      PT LONG TERM GOAL #2   Title  Pt will achieve strength rating throughout bil shoulders of at least 4+/5 to improve performance of daily tasks.    Time  8    Period  Weeks    Status  New    Target Date  12/18/18      PT LONG TERM GOAL #3   Title  Pt will report reduction of Rt UE symptoms by at least 50% throughout the day to improve fine and gross motor tasks.    Time  8    Period  Weeks    Status  New    Target Date  12/18/18      PT LONG TERM GOAL #4   Title  Pt will reduce FOTO to </= 33% to demo less limitation in daily tasks.    Time  8    Period  Weeks    Status  New    Target Date  12/18/18      PT LONG TERM GOAL #5   Title  Pt will achieve at least 60lb Rt grip strength to improve fine motor tasks including holding her cell phone.    Time  8    Period  Weeks    Status  New    Target Date  12/18/18             Plan - 10/23/18 1114    Clinical Impression Statement  Pt presents to PT with history of Rt UE pain, numbness, weakness and limited functional use secondary to diagnosis of thoracic outlet syndrome.  She has signif weakness in Rt grip strength (dominant hand limiting daily functional use) and shoulder strength bil.  Symptoms including numbness and pain are triggered immediately with overhead positioning of Rt UE and somewhat with Lt UE.  Allen Test was immediately positive  and neural tension tests for ulnar and median nerves reproduced symptoms at end range of tension on Rt.  Pt has diffuse highly reactive tenderness to palpation throughout Rt>Lt shoulder, chest and scapular muscle groups with trigger points present.  Pt will benefit from skilled PT for manual techniques including dry needling, pain control, postural re-ed and scapular stabilization, UE strengthening including grip strength,  flexibility and functional training.    History and Personal Factors relevant to plan of care:  progressing Rt UE pain and numbness with limited functional use of dominant hand, symptoms developing in Lt UE as well    Clinical Presentation  Evolving    Clinical Decision Making  Moderate    Rehab Potential  Good    PT Frequency  2x / week    PT Duration  8 weeks    PT Treatment/Interventions  ADLs/Self Care Home Management;Cryotherapy;Electrical Stimulation;Iontophoresis 4mg /ml Dexamethasone;Moist Heat;Traction;Functional mobility training;Therapeutic exercise;Therapeutic activities;Neuromuscular re-education;Patient/family education;Manual techniques;Dry needling;Passive range of motion;Taping;Spinal Manipulations;Joint Manipulations    PT Next Visit Plan  f/u on HEP, DN to Rt pectorals, cervical and posterior shoulder, scapular and cervical stabilization, postural re-ed    PT Home Exercise Plan  Access Code: [email or web address]     Consulted and Agree with Plan of Care  Patient       Patient will benefit from skilled therapeutic intervention in order to improve the following deficits and impairments:  Improper body mechanics, Pain, Impaired sensation, Decreased mobility, Increased muscle spasms, Impaired tone, Postural dysfunction, Decreased activity tolerance, Decreased strength, Hypomobility, Impaired UE functional use, Impaired flexibility  Visit Diagnosis: Chronic right shoulder pain - Plan: PT plan of care cert/re-cert  Muscle weakness (generalized) - Plan: PT plan of care  cert/re-cert     Problem List Patient Active Problem List   Diagnosis Date Noted  . Thoracic outlet syndrome 10/01/2018  . Myofascial pain 09/04/2017  . Bilateral carpal tunnel syndrome 09/04/2017  . Ankle impingement syndrome, left 09/04/2017  . Migraine with aura and without status migrainosus, not intractable 09/06/2015  . Anxiety and depression 11/05/2009  . Attention deficit disorder 11/05/2009    Baruch Merl, PT 10/23/18 11:32 AM   [facility] [address], [place], [place], [address] Phone: [phone]   Fax:  [phone]  Name: [name] [name] MRN: [id number] Date of Birth: [birth year]

## 2018-10-23 NOTE — Patient Instructions (Signed)
Access Code: [email or web address]  URL: [email or web address]  Date: 10/23/2018  Prepared by: Venetia Night Laurella Tull   Exercises  Standing Row with Anchored Resistance - 10 reps - 3 sets - 1x daily - 7x weekly  Shoulder Internal Rotation with Resistance - 10 reps - 3 sets - 1x daily - 7x weekly  Standing Shoulder External Rotation with Resistance - 10 reps - 3 sets - 1x daily - 7x weekly  Shoulder Extension with Resistance - 10 reps - 3 sets - 1x daily - 7x weekly  Seated Cervical Sidebending Stretch - 3 reps - 3 sets - 30 hold - 1x daily - 7x weekly  Doorway Pec Stretch at 90 Degrees Abduction - 3 reps - 3 sets - 30 hold - 1x daily - 7x weekly

## 2018-10-29 ENCOUNTER — Ambulatory Visit: Payer: No Typology Code available for payment source | Admitting: Sports Medicine

## 2018-10-29 ENCOUNTER — Encounter: Payer: Self-pay | Admitting: Sports Medicine

## 2018-10-29 VITALS — BP 120/64 | HR 90 | Ht 66.0 in | Wt 171.8 lb

## 2018-10-29 DIAGNOSIS — M542 Cervicalgia: Secondary | ICD-10-CM | POA: Diagnosis not present

## 2018-10-29 DIAGNOSIS — M9908 Segmental and somatic dysfunction of rib cage: Secondary | ICD-10-CM

## 2018-10-29 DIAGNOSIS — M25511 Pain in right shoulder: Secondary | ICD-10-CM | POA: Diagnosis not present

## 2018-10-29 DIAGNOSIS — G540 Brachial plexus disorders: Secondary | ICD-10-CM

## 2018-10-29 DIAGNOSIS — M7918 Myalgia, other site: Secondary | ICD-10-CM

## 2018-10-29 DIAGNOSIS — Z720 Tobacco use: Secondary | ICD-10-CM

## 2018-10-29 DIAGNOSIS — M9905 Segmental and somatic dysfunction of pelvic region: Secondary | ICD-10-CM

## 2018-10-29 DIAGNOSIS — M9902 Segmental and somatic dysfunction of thoracic region: Secondary | ICD-10-CM

## 2018-10-29 DIAGNOSIS — M9903 Segmental and somatic dysfunction of lumbar region: Secondary | ICD-10-CM

## 2018-10-29 DIAGNOSIS — M9901 Segmental and somatic dysfunction of cervical region: Secondary | ICD-10-CM

## 2018-10-29 MED ORDER — DULOXETINE HCL 60 MG PO CPEP: 60.0000 mg | ORAL_CAPSULE | Freq: Every day | ORAL | 1 refills | Status: DC

## 2018-10-30 ENCOUNTER — Other Ambulatory Visit: Payer: Self-pay

## 2018-10-30 DIAGNOSIS — Z7184 Encounter for health counseling related to travel: Secondary | ICD-10-CM

## 2018-10-30 MED ORDER — ONDANSETRON HCL 4 MG PO TABS: 4.0000 mg | ORAL_TABLET | Freq: Three times a day (TID) | ORAL | 0 refills | Status: DC | PRN

## 2018-10-30 NOTE — Telephone Encounter (Signed)
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

## 2018-10-31 ENCOUNTER — Encounter: Payer: Self-pay | Admitting: Physical Therapy

## 2018-10-31 ENCOUNTER — Encounter: Payer: Self-pay | Admitting: Sports Medicine

## 2018-10-31 ENCOUNTER — Ambulatory Visit: Payer: No Typology Code available for payment source | Attending: Sports Medicine | Admitting: Physical Therapy

## 2018-10-31 DIAGNOSIS — G8929 Other chronic pain: Secondary | ICD-10-CM | POA: Diagnosis present

## 2018-10-31 DIAGNOSIS — M25511 Pain in right shoulder: Secondary | ICD-10-CM | POA: Insufficient documentation

## 2018-10-31 DIAGNOSIS — M6281 Muscle weakness (generalized): Secondary | ICD-10-CM | POA: Diagnosis present

## 2018-10-31 NOTE — Therapy (Signed)
[facility] [address] [place], [place], [address] Phone: [phone]   Fax:  [phone]  Physical Therapy Treatment  Patient Details  Name: [name] [name] MRN: [id number] Date of Birth: October 06, 1980 Referring Provider (PT): Gerda Diss, DO   Encounter Date: 10/31/2018  PT End of Session - 10/31/18 1015    Visit Number  2    Number of Visits  24    Date for PT Re-Evaluation  12/18/18    Authorization Type  [place]    Authorization Time Period  09/28/17-10/27/18 (enter new dates next week)    Authorization - Visit Number  2    Authorization - Number of Visits  24    PT Start Time  6160    PT Stop Time  1100    PT Time Calculation (min)  45 min    Activity Tolerance  Patient tolerated treatment well    Behavior During Therapy  [facility] for tasks assessed/performed       Past Medical History:  Diagnosis Date  . ADHD (attention deficit hyperactivity disorder)   . Anxiety   . Depression   . Fibromyalgia   . Gallstones   . Gestational diabetes   . History of chicken pox   . History of condyloma acuminatum   . Migraines     Past Surgical History:  Procedure Laterality Date  . CESAREAN SECTION  06/25/2011  . CESAREAN SECTION N/A 05/04/2015  . CHOLECYSTECTOMY N/A 07/07/2015  . ERCP N/A 08/24/2015  . KNEE CARTILAGE SURGERY Left     There were no vitals filed for this visit.  Subjective Assessment - 10/31/18 1018    Subjective  Pt states she has a migrain today.  All the pain is more on the right side, but a little on the left.    Patient Stated Goals  get rid of pain, be able to start strengthening, daily activities using dominant hand, hold phone without numbness    Currently in Pain?  Yes    Pain Score  9     Pain Location  Shoulder    Pain Orientation  Right    Pain Descriptors / Indicators  Tingling;Shooting;Numbness    Pain Type  Chronic pain    Pain Onset  More than a month ago    Multiple Pain  Sites  No                       [facility] Adult PT Treatment/Exercise - 10/31/18 0001      Manual Therapy   Manual Therapy  Soft tissue mobilization    Soft tissue mobilization  bil pecs, cervical muslculatrure; Rt side upper trap and rhomboids, bil SCM       Trigger Point Dry Needling - 10/31/18 0001    Consent Given?  Yes    Education Handout Provided  Yes    Muscles Treated Head and Neck  Sternocleidomastoid;Upper trapezius;Oblique capitus;Suboccipitals;Cervical multifidi    Muscles Treated Upper Quadrant  Pectoralis major;Rhomboids    Sternocleidomastoid Response  Twitch response elicited;Palpable increased muscle length    Upper Trapezius Response  Twitch reponse elicited;Palpable increased muscle length    Oblique Capitus Response  Twitch response elicited;Palpable increased muscle length    Cervical multifidi Response  Palpable increased muscle length;Twitch reponse elicited    Pectoralis Major Response  Twitch response elicited;Palpable increased muscle length    Rhomboids Response  Twitch response elicited;Palpable increased muscle length  PT Education - 10/31/18 1333    Education Details  dry needling info    Person(s) Educated  Patient    Methods  Explanation;Handout    Comprehension  Verbalized understanding       PT Short Term Goals - 10/23/18 1118      PT SHORT TERM GOAL #1   Title  Pt will be ind in initial HEP    Time  4    Period  Weeks    Status  New    Target Date  11/20/18      PT SHORT TERM GOAL #2   Title  Pt will report reduction in Rt UE pain and numbness by at least 20%    Time  4    Period  Weeks    Status  New    Target Date  11/20/18      PT SHORT TERM GOAL #3   Title  Pt will achieve Rt grip strength of at least 30lb to improve functional use of dominant hand.    Time  4    Period  Weeks    Status  New    Target Date  11/20/18      PT SHORT TERM GOAL #4   Title  Pt will be able to perform light household  activities including overhead reaching tasks with Rt UE with pain rating not to exceed 3/10.    Time  4    Period  Weeks    Status  New    Target Date  11/20/18        PT Long Term Goals - 10/23/18 1122      PT LONG TERM GOAL #1   Title  Pt will be ind in advanced HEP and understand how to safely progress activity.    Time  8    Period  Weeks    Status  New    Target Date  12/18/18      PT LONG TERM GOAL #2   Title  Pt will achieve strength rating throughout bil shoulders of at least 4+/5 to improve performance of daily tasks.    Time  8    Period  Weeks    Status  New    Target Date  12/18/18      PT LONG TERM GOAL #3   Title  Pt will report reduction of Rt UE symptoms by at least 50% throughout the day to improve fine and gross motor tasks.    Time  8    Period  Weeks    Status  New    Target Date  12/18/18      PT LONG TERM GOAL #4   Title  Pt will reduce FOTO to </= 33% to demo less limitation in daily tasks.    Time  8    Period  Weeks    Status  New    Target Date  12/18/18      PT LONG TERM GOAL #5   Title  Pt will achieve at least 60lb Rt grip strength to improve fine motor tasks including holding her cell phone.    Time  8    Period  Weeks    Status  New    Target Date  12/18/18            Plan - 10/31/18 1328    Clinical Impression Statement  No goals met today d/t initial treatment since eval.  PT focused on pain management due to  increased pain today.  She responded well from DN and reports    PT Treatment/Interventions  ADLs/Self Care Home Management;Cryotherapy;Electrical Stimulation;Iontophoresis 22m/ml Dexamethasone;Moist Heat;Traction;Functional mobility training;Therapeutic exercise;Therapeutic activities;Neuromuscular re-education;Patient/family education;Manual techniques;Dry needling;Passive range of motion;Taping;Spinal Manipulations;Joint Manipulations    PT Next Visit Plan  f/u on DN to Rt pectorals, cervical, SCM, and upper traps and  rhomboids, scapular and cervical stabilization, postural re-ed    PT Home Exercise Plan  Access Code: [email or web address]    Consulted and Agree with Plan of Care  Patient       Patient will benefit from skilled therapeutic intervention in order to improve the following deficits and impairments:  Improper body mechanics, Pain, Impaired sensation, Decreased mobility, Increased muscle spasms, Impaired tone, Postural dysfunction, Decreased activity tolerance, Decreased strength, Hypomobility, Impaired UE functional use, Impaired flexibility  Visit Diagnosis: Chronic right shoulder pain  Muscle weakness (generalized)     Problem List Patient Active Problem List   Diagnosis Date Noted  . Thoracic outlet syndrome 10/01/2018  . Myofascial pain 09/04/2017  . Bilateral carpal tunnel syndrome 09/04/2017  . Ankle impingement syndrome, left 09/04/2017  . Migraine with aura and without status migrainosus, not intractable 09/06/2015  . Anxiety and depression 11/05/2009  . Attention deficit disorder 11/05/2009    JJule Ser PT 10/31/2018, 1:34 PM  [facility] [address] [place] [address]Phone: [phone]  Fax:  [phone] Name: [name] AUSTONMRN: [id number] of Birth: 106-06-1981

## 2018-10-31 NOTE — Progress Notes (Signed)
[name] [name]. [name] [name], [place] [facility], [facility], [facility] [phone]  [name] [name] - 38 y.o. female MRN [id number]  Date of birth: [birth year]  Visit Date: 10/29/2018  PCP: Briscoe Deutscher, DO   Referred by: Briscoe Deutscher, DO  SUBJECTIVE:   Chief Complaint  Patient presents with  . Follow-up    R shoulder pain.  Zanaflex.  PT x 1 visit    HPI: Patient is here for reevaluation of right shoulder, mid back and left lower SI joint pain.  She has had issues with this for years and has been responding well to osteopathic manipulation.  She previously did not tolerate Zanaflex prescribed due to hives and this is been added to her allergy list.  She has worked with physical therapy once and had some improvement with this and is going to be returning on Thursday.  She does report having a fall last month where she slipped and landed on her right elbow.  Symptoms have essentially abated since that time.  She does continue to have some numbness and tingling radiating to the left upper extremity but this is mild.  Worse with overhead motion.  Overall she does feel like she is overall in slightly improving.  Bilateral hand pain and swelling is unchanged.  REVIEW OF SYSTEMS: She does continue to have nighttime disturbances. Fall as reported above.  Chronic headaches and dizziness.  Numbness and tingling is outlined as above. Otherwise 12 point review of systems is negative.  HISTORY:  Prior history reviewed and updated per electronic medical record.  Patient Active Problem List   Diagnosis Date Noted  . Thoracic outlet syndrome 10/01/2018    Clinical diagnosis with underlying likely vascular component.  c-spine XR - 08/15/17   . Myofascial pain 09/04/2017  . Bilateral carpal tunnel syndrome 09/04/2017  . Ankle impingement syndrome, left 09/04/2017    Strong family history of foot issues   . Migraine with aura and without status migrainosus, not intractable  09/06/2015  . Anxiety and depression 11/05/2009  . Attention deficit disorder 11/05/2009   Social History   Occupational History  . Occupation: Doesn't work  Tobacco Use  . Smoking status: Current Some Day Smoker  . Smokeless tobacco: Never Used  . Tobacco comment: smokes socially (1 cigarette a week)  Substance and Sexual Activity  . Alcohol use: No    Alcohol/week: 0.0 standard drinks  . Drug use: No  . Sexual activity: Yes   Social History   Social History Narrative   2012- daughter   2016- son   Married   Does not work.  She was previously a Emergency planning/management officer, last worked in 2012.    Enjoys reading, shopping, beach    OBJECTIVE:  VS:  HT:5\' 6"  (167.6 cm)   WT:171 lb 12.8 oz (77.9 kg)  BMI:27.74    BP:120/64  HR:90bpm  TEMP: ( )  RESP:97 %   PHYSICAL EXAM: Adult female. No acute distress.  Alert and appropriate. Good cervical and lumbar range of motion although there are functional limitations. Negative Spurling's compression test and Lhermitte's compression test.   Upper extremity lower extremity strength is 5/5 in all myotomes. Normal sensation Significant anterior chain dominant posture.    ASSESSMENT:   1. Right shoulder pain, unspecified chronicity   2. Thoracic outlet syndrome   3. Myofascial pain   4. Cervicalgia   5. Somatic dysfunction of cervical region   6. Somatic dysfunction of thoracic region   7. Somatic  dysfunction of lumbar region   8. Somatic dysfunction of pelvis region   9. Somatic dysfunction of rib cage region   10. Tobacco use     PROCEDURES:  PROCEDURE NOTE : OSTEOPATHIC MANIPULATION The decision today to treat with Osteopathic Manipulative Therapy (OMT) was based on physical exam findings. Verbal consent was obtained following a discussion with the patient regarding the of risks, benefits and potential side effects, including an acute pain flare,post manipulation soreness and need for repeat treatments. Minimal risk of injury  to neurovascular structures with cervical manipulation previously discussed in detailed and reaffirmed today.   Contraindications to OMT: NONE  Manipulation was performed as below: Regions Treated & Osteopathic Exam Findings  CERVICAL SPINE: OA - rotated right C4 - 6 Neutral, rotated RIGHT, sidebent LEFT THORACIC SPINE:  T2 - 6 Neutral, rotated LEFT, sidebent RIGHT RIBS:  Rib 7 Right  Posterior LUMBAR SPINE:  L3 FRS RIGHT (Flexed, Rotated & Sidebent) PELVIS:  Left psoas spasm Left anterior innonimate   OMT Techniques Used  HVLA muscle energy myofascial release HVLA - Long Lever    The patient tolerated the treatment well and reported Improved symptoms following treatment today. Patient was given medications, exercises, stretches and lifestyle modifications per AVS and verbally.   PLAN:  Pertinent additional documentation may be included in corresponding procedure notes, imaging studies, problem based documentation and patient instructions.  No problem-specific Assessment & Plan notes found for this encounter.   She does continue have significantly tight upper extremities and neck.  She will benefit from ongoing physical therapy and reevaluation for osteopathic manipulation.  We did discuss how smoking does affect her pain levels today and encouraged her to consider discontinuing smoking altogether.  Continue previously prescribed home exercise program.  Continue working with physical therapy  Discussed the underlying features of tight hip flexors leading to crouched, fetal like position that results in spinal column compression.  Including lumbar hyperflexion with hypermobility, thoracic flexion with restrictive rotation and cervical lordosis reversal.   Osteopathic manipulation was performed today based on physical exam findings.  Patient has responded well to osteopathic manipulation previously the prior manipulation did not provide permanent long lasting relief.  The  patient does feel as though there was significant benefit to the prior manipulation and they wished for repeat manipulation today.  They understand that home therapeutic exercises are critical part of the healing/treatment process and will continue with self treatment between now and their next visit as outlined.  The patient understands that the frequency of visits is meant to provide a stimulus to promote the body's own ability to heal and is not meant to be the sole means for improvement in their symptoms.  Activity modifications and the importance of avoiding exacerbating activities (limiting pain to no more than a 4 / 10 during or following activity) recommended and discussed.  Discussed red flag symptoms that warrant earlier emergent evaluation and patient voices understanding.   Meds ordered this encounter  Medications  . DULoxetine (CYMBALTA) 60 MG capsule    Sig: Take 1 capsule (60 mg total) by mouth daily.    Dispense:  90 capsule    Refill:  1   Lab Orders  No laboratory test(s) ordered today   Imaging Orders  No imaging studies ordered today   Referral Orders  No referral(s) requested today    Return in about 4 weeks (around 11/26/2018) for consideration of repeat Osteopathic Manipulation.          Gerda Diss,  [facility] Physician

## 2018-10-31 NOTE — Patient Instructions (Signed)

## 2018-11-05 ENCOUNTER — Encounter: Payer: Self-pay | Admitting: Physical Therapy

## 2018-11-05 ENCOUNTER — Ambulatory Visit: Payer: No Typology Code available for payment source | Admitting: Physical Therapy

## 2018-11-05 DIAGNOSIS — M6281 Muscle weakness (generalized): Secondary | ICD-10-CM

## 2018-11-05 DIAGNOSIS — M25511 Pain in right shoulder: Principal | ICD-10-CM

## 2018-11-05 DIAGNOSIS — G8929 Other chronic pain: Secondary | ICD-10-CM

## 2018-11-05 NOTE — Therapy (Signed)
[facility] [address] [place], [place], [address] Phone: [phone]   Fax:  [phone]  Physical Therapy Treatment  Patient Details  Name: [name] [name] MRN: [id number] Date of Birth: 10/27/1980 Referring Provider (PT): Gerda Diss, DO   Encounter Date: 11/05/2018  PT End of Session - 11/05/18 1012    Visit Number  3    Number of Visits  24    Date for PT Re-Evaluation  12/18/18    Authorization Type  [facility]    Authorization Time Period  09/28/17-12/18/18    Authorization - Visit Number  3    Authorization - Number of Visits  24    PT Start Time  0930   dry needling    PT Stop Time  1011    PT Time Calculation (min)  41 min    Activity Tolerance  Patient tolerated treatment well;No increased pain    Behavior During Therapy  WFL for tasks assessed/performed       Past Medical History:  Diagnosis Date  . ADHD (attention deficit hyperactivity disorder)   . Anxiety   . Depression   . Fibromyalgia   . Gallstones   . Gestational diabetes   . History of chicken pox   . History of condyloma acuminatum   . Migraines     Past Surgical History:  Procedure Laterality Date  . CESAREAN SECTION  06/25/2011  . CESAREAN SECTION N/A 05/04/2015  . CHOLECYSTECTOMY N/A 07/07/2015  . ERCP N/A 08/24/2015  . KNEE CARTILAGE SURGERY Left     There were no vitals filed for this visit.  Subjective Assessment - 11/05/18 0932    Subjective  Pt states that she felt the dry needling helped alot last session. She also had her meds updated and hopes this helps.     Patient Stated Goals  get rid of pain, be able to start strengthening, daily activities using dominant hand, hold phone without numbness    Currently in Pain?  Yes    Pain Score  5     Pain Location  Shoulder    Pain Orientation  Right;Left;Upper    Pain Descriptors / Indicators  Aching    Pain Type  Chronic pain    Pain Onset  More than a month ago     Pain Frequency  Intermittent    Aggravating Factors   all activity overhead    Pain Relieving Factors  notes dry needling helps    Effect of Pain on Daily Activities  all activities                       [facility] Adult PT Treatment/Exercise - 11/05/18 0001      Exercises   Exercises  Shoulder      Shoulder Exercises: Standing   Internal Rotation  Right;Left;Strengthening;15 reps    Internal Rotation Limitations  x1 set    Extension  Strengthening;Both;15 reps    Extension Limitations  black TB     Row  Strengthening;10 reps    Row Limitations  black TB (cues for proper technique)       Manual Therapy   Soft tissue mobilization  STM Rt upper trap, Rt biceps       Trigger Point Dry Needling - 11/05/18 0001    Muscles Treated Upper Quadrant  Latissimus dorsi;Teres major;Teres minor;Biceps    Latissimus dorsi Response  Twitch response elicited;Palpable increased muscle length   Rt  Teres major Response  Twitch response elicited;Palpable increased muscle length   Rt   Teres minor Response  Twitch response elicited;Palpable increased muscle length   Rt   Biceps Response  Twitch response elicited;Palpable increased muscle length   Rt          PT Education - 11/05/18 1012    Education Details  importance of both dry needling and proper strengthening of scapular muscles    Person(s) Educated  Patient    Methods  Explanation    Comprehension  Verbalized understanding       PT Short Term Goals - 11/05/18 1053      PT SHORT TERM GOAL #1   Title  Pt will be ind in initial HEP    Time  4    Period  Weeks    Status  On-going    Target Date  11/20/18      PT SHORT TERM GOAL #2   Title  Pt will report reduction in Rt UE pain and numbness by at least 20%    Time  4    Period  Weeks    Status  New    Target Date  11/20/18      PT SHORT TERM GOAL #3   Title  Pt will achieve Rt grip strength of at least 30lb to improve functional use of dominant hand.     Time  4    Period  Weeks    Status  New    Target Date  11/20/18      PT SHORT TERM GOAL #4   Title  Pt will be able to perform light household activities including overhead reaching tasks with Rt UE with pain rating not to exceed 3/10.    Time  4    Period  Weeks    Status  New    Target Date  11/20/18        PT Long Term Goals - 10/23/18 1122      PT LONG TERM GOAL #1   Title  Pt will be ind in advanced HEP and understand how to safely progress activity.    Time  8    Period  Weeks    Status  New    Target Date  12/18/18      PT LONG TERM GOAL #2   Title  Pt will achieve strength rating throughout bil shoulders of at least 4+/5 to improve performance of daily tasks.    Time  8    Period  Weeks    Status  New    Target Date  12/18/18      PT LONG TERM GOAL #3   Title  Pt will report reduction of Rt UE symptoms by at least 50% throughout the day to improve fine and gross motor tasks.    Time  8    Period  Weeks    Status  New    Target Date  12/18/18      PT LONG TERM GOAL #4   Title  Pt will reduce FOTO to </= 33% to demo less limitation in daily tasks.    Time  8    Period  Weeks    Status  New    Target Date  12/18/18      PT LONG TERM GOAL #5   Title  Pt will achieve at least 60lb Rt grip strength to improve fine motor tasks including holding her cell phone.    Time  8    Period  Weeks    Status  New    Target Date  12/18/18            Plan - 11/05/18 1046    Clinical Impression Statement  Pt arrived with improvements in pain following dry needling treatment last session. She has not been adherent to her HEP, so this was reviewed with several updates to ensure improved compliance at home. Pt denied increase in pain or numbness in the UE with these exercises. Pt requested dry needling treatment end of session so this was completed to the upper traps, lats and biceps on the Rt primarily. There were several twitch responses noted but pt did have increase  in Rt shoulder flexibility end of session. She would benefit from further work on middle/low trap strength and activation as well as pectoral flexibility for symptoms relief.     PT Treatment/Interventions  ADLs/Self Care Home Management;Cryotherapy;Electrical Stimulation;Iontophoresis 4mg /ml Dexamethasone;Moist Heat;Traction;Functional mobility training;Therapeutic exercise;Therapeutic activities;Neuromuscular re-education;Patient/family education;Manual techniques;Dry needling;Passive range of motion;Taping;Spinal Manipulations;Joint Manipulations    PT Next Visit Plan  DN Rt pectorals, bicep; cervical, SCM; strengthening middle/low trap; self pec release and foam roll stretches for home     PT Home Exercise Plan  Access Code: [email or web address]     Consulted and Agree with Plan of Care  Patient       Patient will benefit from skilled therapeutic intervention in order to improve the following deficits and impairments:  Improper body mechanics, Pain, Impaired sensation, Decreased mobility, Increased muscle spasms, Impaired tone, Postural dysfunction, Decreased activity tolerance, Decreased strength, Hypomobility, Impaired UE functional use, Impaired flexibility  Visit Diagnosis: Chronic right shoulder pain  Muscle weakness (generalized)     Problem List Patient Active Problem List   Diagnosis Date Noted  . Thoracic outlet syndrome 10/01/2018  . Myofascial pain 09/04/2017  . Bilateral carpal tunnel syndrome 09/04/2017  . Ankle impingement syndrome, left 09/04/2017  . Migraine with aura and without status migrainosus, not intractable 09/06/2015  . Anxiety and depression 11/05/2009  . Attention deficit disorder 11/05/2009    10:56 AM,11/05/18 Sherol Dade PT, DPT [place] [facility] [address] [place], [place], [address] Phone: [phone]   Fax:  [phone]  Name:  ANAEL ROSCH MRN: [id number] Date of Birth: 04-22-1981

## 2018-11-08 ENCOUNTER — Other Ambulatory Visit: Payer: Self-pay | Admitting: Family Medicine

## 2018-11-08 NOTE — Telephone Encounter (Signed)
Ok to refill?  pls advise

## 2018-11-09 MED ORDER — BUTALBITAL-APAP-CAFFEINE 50-325-40 MG PO TABS: 1.0000 | ORAL_TABLET | Freq: Four times a day (QID) | ORAL | 0 refills | Status: DC | PRN

## 2018-11-11 ENCOUNTER — Other Ambulatory Visit: Payer: Self-pay | Admitting: Family Medicine

## 2018-11-12 ENCOUNTER — Other Ambulatory Visit: Payer: Self-pay

## 2018-11-12 ENCOUNTER — Encounter: Payer: Self-pay | Admitting: Physical Therapy

## 2018-11-12 ENCOUNTER — Ambulatory Visit: Payer: No Typology Code available for payment source | Admitting: Physical Therapy

## 2018-11-12 ENCOUNTER — Encounter: Payer: Self-pay | Admitting: Family Medicine

## 2018-11-12 DIAGNOSIS — G8929 Other chronic pain: Secondary | ICD-10-CM

## 2018-11-12 DIAGNOSIS — M6281 Muscle weakness (generalized): Secondary | ICD-10-CM

## 2018-11-12 DIAGNOSIS — M25511 Pain in right shoulder: Principal | ICD-10-CM

## 2018-11-12 NOTE — Patient Instructions (Signed)
Access Code: [email or web address]  URL: [email or web address]  Date: 11/12/2018  Prepared by: Ruben Im   Exercises  Standing Row with Anchored Resistance - 10 reps - 3 sets - 1x daily - 7x weekly  Shoulder Internal Rotation with Resistance - 10 reps - 3 sets - 1x daily - 7x weekly  Standing Shoulder External Rotation with Resistance - 10 reps - 3 sets - 1x daily - 7x weekly  Shoulder Extension with Resistance - 10 reps - 3 sets - 1x daily - 7x weekly  Seated Cervical Sidebending Stretch - 3 reps - 3 sets - 30 hold - 1x daily - 7x weekly  Doorway Pec Stretch at 90 Degrees Abduction - 3 reps - 3 sets - 30 hold - 1x daily - 7x weekly  Quadruped Scapular Protraction and Retraction - 5 reps - 1 sets - 1x daily - 7x weekly  Seated Scalene Stretch with Towel - 3 reps - 1 sets - 20 hold - 1x daily - 7x weekly  Seated Cervical Retraction - 5 reps - 1 sets - 1x daily - 7x weekly

## 2018-11-12 NOTE — Therapy (Signed)
[facility] [address] [place], [place], [address] Phone: [phone]   Fax:  [phone]  Physical Therapy Treatment  Patient Details  Name: GWENYTH DINGEE MRN: [id number] Date of Birth: [birth year] Referring Provider (PT): Gerda Diss, DO   Encounter Date: 11/12/2018  PT End of Session - 11/12/18 1358    Visit Number  4    Number of Visits  24    Date for PT Re-Evaluation  12/18/18    Authorization Type  [employer] [facility]    Authorization Time Period  09/28/17-12/18/18    Authorization - Visit Number  4    Authorization - Number of Visits  24    PT Start Time  4268    PT Stop Time  3419    PT Time Calculation (min)  47 min    Activity Tolerance  Patient tolerated treatment well       Past Medical History:  Diagnosis Date  . ADHD (attention deficit hyperactivity disorder)   . Anxiety   . Depression   . Fibromyalgia   . Gallstones   . Gestational diabetes   . History of chicken pox   . History of condyloma acuminatum   . Migraines     Past Surgical History:  Procedure Laterality Date  . CESAREAN SECTION  06/25/2011  . CESAREAN SECTION N/A 05/04/2015  . CHOLECYSTECTOMY N/A 07/07/2015  . ERCP N/A 08/24/2015  . KNEE CARTILAGE SURGERY Left     There were no vitals filed for this visit.  Subjective Assessment - 11/12/18 1226    Subjective  I have knots in my right pectoral and biceps.  I think the DN has helped.  The base of my neck has helped.  My pulse stops at 90 degrees elevation.  Not overally sore from needling.  Difficulty sleeping on right side.  Numbness within a few seconds.      Limitations  Sitting    Patient Stated Goals  get rid of pain, be able to start strengthening, daily activities using dominant hand, hold phone without numbness                       [facility] Adult PT Treatment/Exercise - 11/12/18 0001      Shoulder Exercises: Seated   Other Seated Exercises  towel  assisted scalene stretch 3x       Shoulder Exercises: Prone   Other Prone Exercises  quadruped scapular protraction/retraction 10x      Shoulder Exercises: Standing   Other Standing Exercises  chin tucks    cues to limit protraction      Moist Heat Therapy   Number Minutes Moist Heat  5 Minutes    Moist Heat Location  Shoulder;Cervical      Manual Therapy   Manual therapy comments  glenohumeral distractions grade 3 15x    Soft tissue mobilization  right SCM, scalenes, right pectorals, biceps, upper trap, cervical paraspinals, infraspinatus     Passive ROM  cervical sidebending, neural gliding        Trigger Point Dry Needling - 11/12/18 0001    Consent Given?  Yes    Muscles Treated Head and Neck  Scalenes    Muscles Treated Upper Quadrant  Infraspinatus    Dry Needling Comments  right only     Sternocleidomastoid Response  Twitch response elicited;Palpable increased muscle length    Upper Trapezius Response  Twitch reponse elicited;Palpable increased muscle length    Scalenes  Response  Palpable increased muscle length    Cervical multifidi Response  Palpable increased muscle length;Twitch reponse elicited    Pectoralis Major Response  Twitch response elicited;Palpable increased muscle length    Infraspinatus Response  Twitch response elicited;Palpable increased muscle length    Biceps Response  Twitch response elicited;Palpable increased muscle length   Rt          PT Education - 11/12/18 1348    Education Details   Access Code: [email or web address] quadruped protract/retract; chin tucks, towel assist scalene stretch     Person(s) Educated  Patient    Methods  Explanation;Demonstration;Handout    Comprehension  Returned demonstration;Verbalized understanding       PT Short Term Goals - 11/05/18 1053      PT SHORT TERM GOAL #1   Title  Pt will be ind in initial HEP    Time  4    Period  Weeks    Status  On-going    Target Date  11/20/18      PT SHORT TERM GOAL #2   Title   Pt will report reduction in Rt UE pain and numbness by at least 20%    Time  4    Period  Weeks    Status  New    Target Date  11/20/18      PT SHORT TERM GOAL #3   Title  Pt will achieve Rt grip strength of at least 30lb to improve functional use of dominant hand.    Time  4    Period  Weeks    Status  New    Target Date  11/20/18      PT SHORT TERM GOAL #4   Title  Pt will be able to perform light household activities including overhead reaching tasks with Rt UE with pain rating not to exceed 3/10.    Time  4    Period  Weeks    Status  New    Target Date  11/20/18        PT Long Term Goals - 10/23/18 1122      PT LONG TERM GOAL #1   Title  Pt will be ind in advanced HEP and understand how to safely progress activity.    Time  8    Period  Weeks    Status  New    Target Date  12/18/18      PT LONG TERM GOAL #2   Title  Pt will achieve strength rating throughout bil shoulders of at least 4+/5 to improve performance of daily tasks.    Time  8    Period  Weeks    Status  New    Target Date  12/18/18      PT LONG TERM GOAL #3   Title  Pt will report reduction of Rt UE symptoms by at least 50% throughout the day to improve fine and gross motor tasks.    Time  8    Period  Weeks    Status  New    Target Date  12/18/18      PT LONG TERM GOAL #4   Title  Pt will reduce FOTO to </= 33% to demo less limitation in daily tasks.    Time  8    Period  Weeks    Status  New    Target Date  12/18/18      PT LONG TERM GOAL #5   Title  Pt will achieve at  least 60lb Rt grip strength to improve fine motor tasks including holding her cell phone.    Time  8    Period  Weeks    Status  New    Target Date  12/18/18            Plan - 11/12/18 1309    Clinical Impression Statement  The patient reports short term improvements in her pain/numbness and tingling.  She has numerous tender points in upper quarter musculature and following DN and manual therapy, she has improved  soft tissue length and mobility.  She continues to have an increase in symptoms immediately with UE elevation to 90 degrees.  Therapist closely monitoring response with all interventions.      Rehab Potential  Good    PT Frequency  2x / week    PT Duration  8 weeks    PT Treatment/Interventions  ADLs/Self Care Home Management;Cryotherapy;Electrical Stimulation;Iontophoresis 4mg /ml Dexamethasone;Moist Heat;Traction;Functional mobility training;Therapeutic exercise;Therapeutic activities;Neuromuscular re-education;Patient/family education;Manual techniques;Dry needling;Passive range of motion;Taping;Spinal Manipulations;Joint Manipulations    PT Next Visit Plan  assess response to extensive DN of right upper quarter;  self pec release and foam roll stretches;  neural flossing;  assess progress with STGs next week    PT Home Exercise Plan  Access Code: [email or web address]        Patient will benefit from skilled therapeutic intervention in order to improve the following deficits and impairments:  Improper body mechanics, Pain, Impaired sensation, Decreased mobility, Increased muscle spasms, Impaired tone, Postural dysfunction, Decreased activity tolerance, Decreased strength, Hypomobility, Impaired UE functional use, Impaired flexibility  Visit Diagnosis: Chronic right shoulder pain  Muscle weakness (generalized)     Problem List Patient Active Problem List   Diagnosis Date Noted  . Thoracic outlet syndrome 10/01/2018  . Myofascial pain 09/04/2017  . Bilateral carpal tunnel syndrome 09/04/2017  . Ankle impingement syndrome, left 09/04/2017  . Migraine with aura and without status migrainosus, not intractable 09/06/2015  . Anxiety and depression 11/05/2009  . Attention deficit disorder 11/05/2009   Ruben Im, PT 11/12/18 2:54 PM Phone: [phone] Fax: [phone] Alvera Singh 11/12/2018, 2:53 PM  [facility] [address],  [place], [place], [address] Phone: [phone]   Fax:  [phone]  Name: WILSIE KERN MRN: [id number] Date of Birth: January 31, 1981

## 2018-11-13 MED ORDER — BUTALBITAL-APAP-CAFFEINE 50-325-40 MG PO TABS: 1.0000 | ORAL_TABLET | Freq: Four times a day (QID) | ORAL | 3 refills | Status: DC | PRN

## 2018-11-14 ENCOUNTER — Ambulatory Visit: Payer: No Typology Code available for payment source | Admitting: Physical Therapy

## 2018-11-14 ENCOUNTER — Telehealth: Payer: Self-pay | Admitting: Physical Therapy

## 2018-11-14 NOTE — Telephone Encounter (Signed)
No show. Attempted to call pt regarding missed appointment and [facility] encouraging pt to return call when able.   11:18 AM,11/14/18 [place], [place] at [place]

## 2018-11-18 ENCOUNTER — Other Ambulatory Visit: Payer: Self-pay | Admitting: Family Medicine

## 2018-11-18 DIAGNOSIS — M542 Cervicalgia: Secondary | ICD-10-CM

## 2018-11-18 MED ORDER — CYCLOBENZAPRINE HCL 5 MG PO TABS: 5.0000 mg | ORAL_TABLET | Freq: Every day | ORAL | 0 refills | Status: DC | PRN

## 2018-11-18 NOTE — Telephone Encounter (Signed)
I would need a [place] visit. Saw Paulla Fore recently so may forward to him. EW

## 2018-11-18 NOTE — Telephone Encounter (Signed)
PCP Dr. [name].  Last refilled by Dr. Yong Channel 10/23/2018 #20/0.  Per note from Dr. [name], forwarding to Dr. Paulla Fore  Last OV with Dr. Paulla Fore 10/29/2018.

## 2018-11-19 ENCOUNTER — Encounter: Payer: No Typology Code available for payment source | Admitting: Physical Therapy

## 2018-11-21 ENCOUNTER — Encounter: Payer: No Typology Code available for payment source | Admitting: Physical Therapy

## 2018-11-21 ENCOUNTER — Encounter: Payer: Self-pay | Admitting: Family Medicine

## 2018-11-21 NOTE — Progress Notes (Signed)
Virtual Visit via Video   I connected with [name] [name] on 11/22/18 at  8:00 AM EDT by a video enabled telemedicine application and verified that I am speaking with the correct person using two identifiers. Location patient: Home Location provider: [facility], Office Persons participating in the virtual visit: SHAKARI QAZI, Briscoe Deutscher, DO, [name] [name], as CMA scribe.   I discussed the limitations of evaluation and management by telemedicine and the availability of in person appointments. The patient expressed understanding and agreed to proceed.  Subjective:  [name] [name], CMA acting as scribe for Dr. Briscoe Deutscher.   HPI: Patient having sinus pressure on left more than right. Side. She has had a lot of vertigo. She is having decreased hearing and pain in both eras. No fever no exposure to Covid that she is aware of. Her son has had low grade fever for three days and has seen his doctor and was told it was allergies. She did states that she the last few days she has had lack of sense of smell and chest heaviness. She was instructed to stay in home and monitor symptoms. She was instructed to call if any new or change in symptoms.   ROS: See pertinent positives and negatives per HPI.  Patient Active Problem List   Diagnosis Date Noted  . Thoracic outlet syndrome 10/01/2018  . Myofascial pain 09/04/2017  . Bilateral carpal tunnel syndrome 09/04/2017  . Ankle impingement syndrome, left 09/04/2017  . Migraine with aura and without status migrainosus, not intractable 09/06/2015  . Anxiety and depression 11/05/2009  . Attention deficit disorder 11/05/2009    Social History   Tobacco Use  . Smoking status: Current Some Day Smoker  . Smokeless tobacco: Never Used  . Tobacco comment: smokes socially (1 cigarette a week)  Substance Use Topics  . Alcohol use: No    Alcohol/week: 0.0 standard drinks    Current Outpatient Medications:  .  albuterol (PROVENTIL  HFA;VENTOLIN HFA) 108 (90 Base) MCG/ACT inhaler, Inhale 2 puffs into the lungs every 6 (six) hours as needed for wheezing or shortness of breath., Disp: 1 Inhaler, Rfl: 0 .  ALPRAZolam (XANAX) 0.5 MG tablet, Take 0.5 mg by mouth 4 (four) times daily as needed for anxiety. , Disp: , Rfl: 5 .  amphetamine-dextroamphetamine (ADDERALL) 20 MG tablet, Take 20 mg by mouth 3 (three) times daily., Disp: , Rfl:  .  butalbital-acetaminophen-caffeine (FIORICET, ESGIC) 50-325-40 MG tablet, Take 1 tablet by mouth every 6 (six) hours as needed for headache., Disp: 20 tablet, Rfl: 3 .  cetirizine (ZYRTEC) 10 MG tablet, Take 10 mg by mouth daily., Disp: , Rfl:  .  cyclobenzaprine (FLEXERIL) 5 MG tablet, Take 1 tablet (5 mg total) by mouth daily as needed for muscle spasms., Disp: 30 tablet, Rfl: 0 .  drospirenone-ethinyl estradiol (YASMIN,ZARAH,SYEDA) 3-0.03 MG tablet, Take 1 tablet by mouth daily., Disp: , Rfl:  .  DULoxetine (CYMBALTA) 60 MG capsule, Take 1 capsule (60 mg total) by mouth daily., Disp: 90 capsule, Rfl: 1 .  MICROGESTIN FE 1/20 1-20 MG-MCG tablet, Take 1 tablet by mouth daily., Disp: , Rfl: 11 .  pantoprazole (PROTONIX) 40 MG tablet, Take 1 tablet (40 mg total) by mouth 2 (two) times daily. Use Protonix 40 mg twice a day for 8 weeks., Disp: 120 tablet, Rfl: 0 .  chlorpheniramine-HYDROcodone (TUSSIONEX PENNKINETIC ER) 10-8 MG/5ML SUER, Take 5 mLs by mouth at bedtime as needed for cough., Disp: 60 mL, Rfl: 0 .  clindamycin (CLEOCIN) 300 MG capsule, Take 1 capsule (300 mg total) by mouth 3 (three) times daily., Disp: 30 capsule, Rfl: 0 .  fluconazole (DIFLUCAN) 150 MG tablet, Take one and retreat if still having symptoms in 3 days., Disp: 2 tablet, Rfl: 0 .  ondansetron (ZOFRAN) 4 MG tablet, Take 1 tablet (4 mg total) by mouth every 8 (eight) hours as needed for nausea or vomiting., Disp: 20 tablet, Rfl: 0 .  predniSONE (DELTASONE) 5 MG tablet, 6-5-4-3-2-1, Disp: 21 tablet, Rfl: 0  Allergies  Allergen  Reactions  . Ibuprofen Hives and Other (See Comments)    stuffiness  . Toradol [Ketorolac Tromethamine] Hives  . Aspirin Swelling  . Carafate [Sucralfate] Itching  . Food     Nuts and Yeast  . Latex Hives  . Lovenox [Enoxaparin Sodium] Other (See Comments)    Face flush and redness  . Nsaids Hives  . Other Hives    CATS  . Penicillins Hives  . Tizanidine Itching  . Yellow Dyes (Non-Tartrazine) Hives    YELLOW No. 5  . Zanaflex [Tizanidine Hcl] Rash    Objective:   VITALS: Per patient if applicable, see vitals. GENERAL: Alert, appears well and in no acute distress. HEENT: Atraumatic, conjunctiva clear, no obvious abnormalities on inspection of external nose and ears. NECK: Normal movements of the head and neck. CARDIOPULMONARY: No increased WOB. Speaking in clear sentences. I:E ratio WNL.  MS: Moves all visible extremities without noticeable abnormality. PSYCH: Pleasant and cooperative, well-groomed. Speech normal rate and rhythm. Affect is appropriate. Insight and judgement are appropriate. Attention is focused, linear, and appropriate.  NEURO: CN grossly intact. Oriented as arrived to appointment on time with no prompting. Moves both UE equally.  SKIN: No obvious lesions, wounds, erythema, or cyanosis noted on face or hands.  Assessment and Plan:   [name] was seen today for follow-up.  Diagnoses and all orders for this visit:  Bacterial sinusitis -     fluconazole (DIFLUCAN) 150 MG tablet; Take one and retreat if still having symptoms in 3 days. -     clindamycin (CLEOCIN) 300 MG capsule; Take 1 capsule (300 mg total) by mouth 3 (three) times daily.  Bronchospasm with bronchitis, acute -     predniSONE (DELTASONE) 5 MG tablet; 6-5-4-3-2-1 -     chlorpheniramine-HYDROcodone (TUSSIONEX PENNKINETIC ER) 10-8 MG/5ML SUER; Take 5 mLs by mouth at bedtime as needed for cough.   . Reviewed expectations re: course of current medical issues. . Discussed self-management of  symptoms. . Outlined signs and symptoms indicating need for more acute intervention. . Patient verbalized understanding and all questions were answered. Marland Kitchen Health Maintenance issues including appropriate healthy diet, exercise, and smoking avoidance were discussed with patient. . See orders for this visit as documented in the electronic medical record.  Briscoe Deutscher, DO 11/22/2018

## 2018-11-22 ENCOUNTER — Other Ambulatory Visit: Payer: Self-pay

## 2018-11-22 ENCOUNTER — Encounter: Payer: Self-pay | Admitting: Family Medicine

## 2018-11-22 ENCOUNTER — Ambulatory Visit (INDEPENDENT_AMBULATORY_CARE_PROVIDER_SITE_OTHER): Payer: No Typology Code available for payment source | Admitting: Family Medicine

## 2018-11-22 VITALS — Temp 98.0°F | Ht 66.0 in | Wt 168.4 lb

## 2018-11-22 DIAGNOSIS — J329 Chronic sinusitis, unspecified: Secondary | ICD-10-CM | POA: Diagnosis not present

## 2018-11-22 DIAGNOSIS — B9689 Other specified bacterial agents as the cause of diseases classified elsewhere: Secondary | ICD-10-CM

## 2018-11-22 DIAGNOSIS — J209 Acute bronchitis, unspecified: Secondary | ICD-10-CM

## 2018-11-22 DIAGNOSIS — Z7184 Encounter for health counseling related to travel: Secondary | ICD-10-CM

## 2018-11-22 MED ORDER — PREDNISONE 5 MG PO TABS: ORAL_TABLET | ORAL | 0 refills | Status: DC

## 2018-11-22 MED ORDER — ONDANSETRON HCL 4 MG PO TABS: 4.0000 mg | ORAL_TABLET | Freq: Three times a day (TID) | ORAL | 0 refills | Status: DC | PRN

## 2018-11-22 MED ORDER — FLUCONAZOLE 150 MG PO TABS: ORAL_TABLET | ORAL | 0 refills | Status: DC

## 2018-11-22 MED ORDER — CLINDAMYCIN HCL 300 MG PO CAPS: 300.0000 mg | ORAL_CAPSULE | Freq: Three times a day (TID) | ORAL | 0 refills | Status: DC

## 2018-11-22 MED ORDER — HYDROCOD POLST-CPM POLST ER 10-8 MG/5ML PO SUER: 5.0000 mL | Freq: Every evening | ORAL | 0 refills | Status: DC | PRN

## 2018-11-23 ENCOUNTER — Encounter: Payer: Self-pay | Admitting: Family Medicine

## 2018-11-24 ENCOUNTER — Other Ambulatory Visit: Payer: Self-pay

## 2018-11-24 MED ORDER — AZITHROMYCIN 250 MG PO TABS: ORAL_TABLET | ORAL | 0 refills | Status: DC

## 2018-11-24 NOTE — Telephone Encounter (Signed)
See other note. Will complete this one to avoid confusion.

## 2018-11-24 NOTE — Telephone Encounter (Signed)
Called patient let them know meds are going to be called in to send message if any problems getting. Added note to script.

## 2018-11-26 ENCOUNTER — Ambulatory Visit: Payer: No Typology Code available for payment source | Admitting: Sports Medicine

## 2018-11-28 ENCOUNTER — Encounter: Payer: No Typology Code available for payment source | Admitting: Physical Therapy

## 2018-12-10 ENCOUNTER — Encounter: Payer: No Typology Code available for payment source | Admitting: Physical Therapy

## 2018-12-12 ENCOUNTER — Encounter: Payer: No Typology Code available for payment source | Admitting: Physical Therapy

## 2018-12-17 ENCOUNTER — Ambulatory Visit: Payer: No Typology Code available for payment source | Admitting: Physical Therapy

## 2018-12-17 ENCOUNTER — Ambulatory Visit: Payer: No Typology Code available for payment source | Admitting: Sports Medicine

## 2018-12-20 ENCOUNTER — Other Ambulatory Visit: Payer: Self-pay

## 2018-12-20 ENCOUNTER — Other Ambulatory Visit: Payer: Self-pay | Admitting: Sports Medicine

## 2018-12-20 DIAGNOSIS — K259 Gastric ulcer, unspecified as acute or chronic, without hemorrhage or perforation: Secondary | ICD-10-CM

## 2018-12-20 DIAGNOSIS — M542 Cervicalgia: Secondary | ICD-10-CM

## 2018-12-20 DIAGNOSIS — Z7184 Encounter for health counseling related to travel: Secondary | ICD-10-CM

## 2018-12-20 MED ORDER — PANTOPRAZOLE SODIUM 40 MG PO TBEC: 40.0000 mg | DELAYED_RELEASE_TABLET | Freq: Two times a day (BID) | ORAL | 3 refills | Status: DC

## 2018-12-20 MED ORDER — CYCLOBENZAPRINE HCL 5 MG PO TABS: 5.0000 mg | ORAL_TABLET | Freq: Every day | ORAL | 0 refills | Status: DC | PRN

## 2018-12-20 MED ORDER — ONDANSETRON HCL 4 MG PO TABS: 4.0000 mg | ORAL_TABLET | Freq: Three times a day (TID) | ORAL | 0 refills | Status: DC | PRN

## 2018-12-30 ENCOUNTER — Encounter: Payer: Self-pay | Admitting: Family Medicine

## 2018-12-31 ENCOUNTER — Ambulatory Visit: Payer: No Typology Code available for payment source | Admitting: Family Medicine

## 2018-12-31 ENCOUNTER — Other Ambulatory Visit: Payer: Self-pay

## 2018-12-31 ENCOUNTER — Encounter: Payer: Self-pay | Admitting: Family Medicine

## 2018-12-31 ENCOUNTER — Other Ambulatory Visit (HOSPITAL_COMMUNITY)
Admission: RE | Admit: 2018-12-31 | Discharge: 2018-12-31 | Disposition: A | Discharge status: Home / Self Care | Payer: No Typology Code available for payment source | Source: Ambulatory Visit | Attending: Family Medicine | Admitting: Family Medicine

## 2018-12-31 VITALS — BP 124/62 | HR 89 | Temp 98.6°F | Ht 66.0 in | Wt 171.0 lb

## 2018-12-31 DIAGNOSIS — L819 Disorder of pigmentation, unspecified: Secondary | ICD-10-CM | POA: Diagnosis present

## 2018-12-31 DIAGNOSIS — H6191 Disorder of right external ear, unspecified: Secondary | ICD-10-CM | POA: Insufficient documentation

## 2018-12-31 DIAGNOSIS — D229 Melanocytic nevi, unspecified: Secondary | ICD-10-CM

## 2018-12-31 NOTE — Progress Notes (Signed)
[name] [name] is a 38 y.o. female here for an acute visit.  History of Present Illness:   [name] [name], CMA acting as scribe for Dr. Briscoe Deutscher.   HPI: Patient husband noticed spot on back and thought it was tick but not sure. She is not able to see. Also, c/o nonhealing wound on right ear.  PMHx, SurgHx, SocialHx, Medications, and Allergies were reviewed in the Visit Navigator and updated as appropriate.  Current Medications   Current Outpatient Medications:  .  albuterol (PROVENTIL HFA;VENTOLIN HFA) 108 (90 Base) MCG/ACT inhaler, Inhale 2 puffs into the lungs every 6 (six) hours as needed for wheezing or shortness of breath., Disp: 1 Inhaler, Rfl: 0 .  ALPRAZolam (XANAX) 0.5 MG tablet, Take 0.5 mg by mouth 4 (four) times daily as needed for anxiety. , Disp: , Rfl: 5 .  amphetamine-dextroamphetamine (ADDERALL) 20 MG tablet, Take 20 mg by mouth 3 (three) times daily., Disp: , Rfl:  .  azithromycin (ZITHROMAX) 250 MG tablet, 2 tab on day one then one a day after, Disp: 6 tablet, Rfl: 0 .  butalbital-acetaminophen-caffeine (FIORICET, ESGIC) 50-325-40 MG tablet, Take 1 tablet by mouth every 6 (six) hours as needed for headache., Disp: 20 tablet, Rfl: 3 .  cetirizine (ZYRTEC) 10 MG tablet, Take 10 mg by mouth daily., Disp: , Rfl:  .  chlorpheniramine-HYDROcodone (TUSSIONEX PENNKINETIC ER) 10-8 MG/5ML SUER, Take 5 mLs by mouth at bedtime as needed for cough., Disp: 60 mL, Rfl: 0 .  clindamycin (CLEOCIN) 300 MG capsule, Take 1 capsule (300 mg total) by mouth 3 (three) times daily., Disp: 30 capsule, Rfl: 0 .  cyclobenzaprine (FLEXERIL) 5 MG tablet, Take 1 tablet (5 mg total) by mouth daily as needed for muscle spasms., Disp: 30 tablet, Rfl: 0 .  drospirenone-ethinyl estradiol (YASMIN,ZARAH,SYEDA) 3-0.03 MG tablet, Take 1 tablet by mouth daily., Disp: , Rfl:  .  DULoxetine (CYMBALTA) 60 MG capsule, Take 1 capsule (60 mg total) by mouth daily., Disp: 90 capsule, Rfl: 1 .  fluconazole  (DIFLUCAN) 150 MG tablet, Take one and retreat if still having symptoms in 3 days., Disp: 2 tablet, Rfl: 0 .  MICROGESTIN FE 1/20 1-20 MG-MCG tablet, Take 1 tablet by mouth daily., Disp: , Rfl: 11 .  ondansetron (ZOFRAN) 4 MG tablet, Take 1 tablet (4 mg total) by mouth every 8 (eight) hours as needed for nausea or vomiting., Disp: 20 tablet, Rfl: 0 .  pantoprazole (PROTONIX) 40 MG tablet, Take 1 tablet (40 mg total) by mouth 2 (two) times daily. Use Protonix 40 mg twice a day for 8 weeks., Disp: 60 tablet, Rfl: 3 .  predniSONE (DELTASONE) 5 MG tablet, 6-5-4-3-2-1, Disp: 21 tablet, Rfl: 0   Allergies  Allergen Reactions  . Ibuprofen Hives and Other (See Comments)    stuffiness  . Toradol [Ketorolac Tromethamine] Hives  . Aspirin Swelling  . Carafate [Sucralfate] Itching  . Clindamycin Hives and Itching  . Food     Nuts and Yeast  . Latex Hives  . Lovenox [Enoxaparin Sodium] Other (See Comments)    Face flush and redness  . Nsaids Hives  . Other Hives    CATS  . Penicillins Hives  . Tizanidine Itching  . Yellow Dyes (Non-Tartrazine) Hives    YELLOW No. 5  . Zanaflex [Tizanidine Hcl] Rash   Review of Systems   Pertinent items are noted in the HPI. Otherwise, ROS is negative.  Vitals   Vitals:   12/31/18 1312  BP: 124/62  Pulse: 89  Temp: 98.6 F (37 C)  TempSrc: Oral  SpO2: 98%  Weight: 171 lb (77.6 kg)  Height: 5\' 6"  (1.676 m)     Body mass index is 27.6 kg/m.  Physical Exam   Physical Exam Nursing note reviewed. Exam conducted with a chaperone present.  Skin:    Comments: Right ear with crusty, skin-colored lesion. Right bra line with 3 mm asymmetric mole with darker center.    Assessment and Plan   [name] [name] was seen today for insect bite.  Diagnoses and all orders for this visit:  Change in color of skin mole -     Dermatology pathology([place]) -     Dermatology pathology([place])  Skin lesion of right external ear -     Dermatology  pathology([place]) -     Dermatology pathology([place])  Procedure Note Punch biopsy. Indication: suspicious mole. Location: right bra line. Size: 3 mm. Verbal informed consent obtained.  Pt aware of risks not limited to but including infection, bleeding, damage to near by organs. Prep: alcohol. Anesthesia: 1%lidocaine with epi, good effect. Punch made with 4 mm punch. Minimal oozing, controlled with Drysol. Tolerated well. Routine postprocedure instructions d/w pt- keep area clean and bandaged, follow up if concerns/spreading erythema/pain.  Procedure Note Procedure:  Skin biopsy Indication: Suspicious lesion(s)   Risks including unsuccessful procedure, bleeding, infection, bruising, scar, a need for another complete procedure and others were explained to the patient in detail as well as the benefits. Informed consent was obtained and signed. The patient was placed in a decubitus position. Lesion #1 on right external ear measuring 2-4 mm. Skin over lesion #1 was prepped with alcohol. Shave biopsy with a sterile Dermablade was carried out in the usual fashion. Hyfrecator was used to destroy the rest of the lesion potentially left behind and for hemostasis. Band-Aid was applied with antibiotic ointment.  . Reviewed expectations re: course of current medical issues. . Discussed self-management of symptoms. . Outlined signs and symptoms indicating need for more acute intervention. . Patient verbalized understanding and all questions were answered. Marland Kitchen Health Maintenance issues including appropriate healthy diet, exercise, and smoking avoidance were discussed with patient. . See orders for this visit as documented in the electronic medical record. . Patient received an After Visit Summary.  CMA served as Education administrator during this visit. History, Physical, and Plan performed by medical provider. The above documentation has been reviewed and is accurate and complete. Briscoe Deutscher, D.O.  Briscoe Deutscher, DO [place], [facility] 12/31/2018

## 2019-01-03 ENCOUNTER — Encounter: Payer: Self-pay | Admitting: Family Medicine

## 2019-01-07 ENCOUNTER — Other Ambulatory Visit: Payer: Self-pay

## 2019-01-07 ENCOUNTER — Encounter: Payer: Self-pay | Admitting: Physical Therapy

## 2019-01-07 ENCOUNTER — Ambulatory Visit: Payer: No Typology Code available for payment source | Attending: Sports Medicine | Admitting: Physical Therapy

## 2019-01-07 DIAGNOSIS — M6281 Muscle weakness (generalized): Secondary | ICD-10-CM | POA: Diagnosis present

## 2019-01-07 DIAGNOSIS — M25511 Pain in right shoulder: Secondary | ICD-10-CM | POA: Insufficient documentation

## 2019-01-07 DIAGNOSIS — G8929 Other chronic pain: Secondary | ICD-10-CM | POA: Diagnosis present

## 2019-01-07 NOTE — Therapy (Addendum)
[facility] [address] [place], [place], [address] Phone: [phone]   Fax:  [phone]  Physical Therapy Treatment/Re-eval/Discharge  Patient Details  Name: [name] [name] MRN: [id number] Date of Birth: 09-09-1980 Referring Provider (PT): Gerda Diss, DO   Encounter Date: 01/07/2019  PT End of Session - 01/07/19 1546    Visit Number  5    Number of Visits  24    Date for PT Re-Evaluation  02/21/19    Authorization Type  [employer]    Authorization Time Period  01/07/19 to 02/21/19    Authorization - Visit Number  5    Authorization - Number of Visits  24    PT Start Time  1500    PT Stop Time  1545    PT Time Calculation (min)  45 min    Activity Tolerance  No increased pain    Behavior During Therapy  [facility] for tasks assessed/performed       Past Medical History:  Diagnosis Date  . ADHD (attention deficit hyperactivity disorder)   . Anxiety   . Depression   . Fibromyalgia   . Gallstones   . Gestational diabetes   . History of chicken pox   . History of condyloma acuminatum   . Migraines     Past Surgical History:  Procedure Laterality Date  . CESAREAN SECTION  06/25/2011  . CESAREAN SECTION N/A 05/04/2015  . CHOLECYSTECTOMY N/A 07/07/2015  . ERCP N/A 08/24/2015  . KNEE CARTILAGE SURGERY Left     There were no vitals filed for this visit.  Subjective Assessment - 01/07/19 1459    Subjective  Pt states that she is a mess today. She is still having alot of Rt shoulder pain. She still can't sleep on the shoulder. She has not been doing her exercises but has done some of her stretches.     Limitations  Sitting    Patient Stated Goals  get rid of pain, be able to start strengthening, daily activities using dominant hand, hold phone without numbness    Currently in Pain?  Yes    Pain Score  5     Pain Location  Shoulder    Pain Orientation  Right;Upper    Pain Descriptors / Indicators   Aching;Sore    Pain Type  Chronic pain    Pain Onset  More than a month ago    Pain Frequency  Intermittent    Aggravating Factors   all activity overhead, lifting heavy objects     Pain Relieving Factors  dry needling will help alot    Effect of Pain on Daily Activities  all activities         [facility] PT Assessment - 01/07/19 0001      Assessment   Medical Diagnosis  M25.511 (ICD-10-CM) - Right shoulder pain, unspecified chronicity G54.0 (ICD-10-CM) - Thoracic outlet syndrome     Referring Provider (PT)  Gerda Diss, DO    Onset Date/Surgical Date  --   6 weeks ago   Hand Dominance  Right    Next MD Visit  --   mid-March   Prior Therapy  yes      Precautions   Precautions  None      Restrictions   Weight Bearing Restrictions  No      Balance Screen   Has the patient fallen in the past 6 months  Yes    How many times?  1  Has the patient had a decrease in activity level because of a fear of falling?   No    Is the patient reluctant to leave their home because of a fear of falling?   No      Home Film/video editor residence    Living Arrangements  Spouse/significant other;Children      Prior Function   Level of Independence  Independent    Vocation  Unemployed    Vocation Requirements  kids are 3.5 and 7    Leisure  read, dance, travel      Cognition   Overall Cognitive Status  Within Functional Limits for tasks assessed      Observation/Other Assessments   Observations  Mild diffuse swelling present Rt hand    Focus on Therapeutic Outcomes (FOTO)   45%   33%     Sensation   Light Touch  Impaired Detail    Light Touch Impaired Details  --   unable to determine, pt unable to focus on specific location     Posture/Postural Control   Posture/Postural Control  Postural limitations    Postural Limitations  Forward head      AROM   Overall AROM Comments  grossly WNL throughout bil UEs but with Rt UE pain and numbness with overhead  shoulder flexion and abduction   mild Lt UE numbness with overhead positions as well     Strength   Overall Strength  Deficits    Overall Strength Comments  grip strength Rt 15lb, Lt 75#      Palpation   Spinal mobility  fair mobility lower c-spine flexion and upper thoracic spine extension    Palpation comment  tender: bil supraspinatus tendon, lats, pec minor, pec major, teres minor, teres major, posterior deltoid, supraspinatus, upper traps, subscapularis   Rt>Lt throughout     Special Tests    Special Tests  Thoracic Outlet Syndrome    Other special tests  ULTT: neural tension with symptom reproduction present ulnar and median > radial    Thoracic Outlet Syndrome   --      Zenia Resides Test   Findings  --    Side   --                   [facility] Adult PT Treatment/Exercise - 01/07/19 0001      Shoulder Exercises: Stretch   Other Shoulder Stretches  therapist demonstrating HEP updates with B lat stretch over foam roll; supine snow angels, and threading the needle over foam roll       Manual Therapy   Manual therapy comments  completed with dry needling     Soft tissue mobilization  Rt upper trap, Rt poserior shoulder       Trigger Point Dry Needling - 01/07/19 0001    Consent Given?  Yes    Education Handout Provided  Previously provided    Muscles Treated Upper Quadrant  Infraspinatus;Subscapularis;Latissimus dorsi;Teres major   Rt   Upper Trapezius Response  Twitch reponse elicited;Palpable increased muscle length    Infraspinatus Response  Twitch response elicited;Palpable increased muscle length    Subscapularis Response  Twitch response elicited;Palpable increased muscle length    Latissimus dorsi Response  Twitch response elicited;Palpable increased muscle length    Teres major Response  Twitch response elicited;Palpable increased muscle length           PT Education - 01/07/19 1700    Education Details  importance of  completing HEP; updated HEP     Person(s) Educated  Patient    Methods  Explanation;Handout;Verbal cues    Comprehension  Verbalized understanding;Returned demonstration       PT Short Term Goals - 01/07/19 1707      PT SHORT TERM GOAL #1   Title  Pt will be ind in initial HEP    Time  4    Period  Weeks    Status  On-going    Target Date  01/28/19      PT SHORT TERM GOAL #2   Title  Pt will report reduction in Rt UE pain and numbness by at least 20%    Time  4    Period  Weeks    Status  On-going    Target Date  01/28/19      PT SHORT TERM GOAL #3   Title  Pt will achieve Rt grip strength of at least 30lb to improve functional use of dominant hand.    Time  4    Period  Weeks    Status  On-going    Target Date  11/20/18      PT SHORT TERM GOAL #4   Title  Pt will be able to perform light household activities including overhead reaching tasks with Rt UE with pain rating not to exceed 3/10.    Time  4    Period  Weeks    Status  On-going        PT Long Term Goals - 01/07/19 1708      PT LONG TERM GOAL #1   Title  Pt will be ind in advanced HEP and understand how to safely progress activity.    Time  6    Period  Weeks    Status  New    Target Date  02/21/19      PT LONG TERM GOAL #2   Title  Pt will achieve strength rating throughout bil shoulders of at least 4+/5 to improve performance of daily tasks.    Time  6    Period  Weeks    Status  New      PT LONG TERM GOAL #3   Title  Pt will report reduction of Rt UE symptoms by at least 50% throughout the day to improve fine and gross motor tasks.    Time  8    Period  Weeks    Status  On-going      PT LONG TERM GOAL #4   Title  Pt will reduce FOTO to </= 33% to demo less limitation in daily tasks.    Time  6    Period  Weeks    Status  On-going      PT LONG TERM GOAL #5   Title  Pt will achieve at least 60lb Rt grip strength to improve fine motor tasks including holding her cell phone.    Time  6    Period  Weeks    Status  On-going             Plan - 01/07/19 1701    Clinical Impression Statement  Pt has had good results following dry needling in previous sessions, however she has been unable to attend PT over the past month. She continues to note BUE numbness with UE elevation and pain with RUE use throughout the day. Pt has a difficult time pin pointing the location of her numbness, but describes it as "all over". She  has significant tenderness along the Rt posterior shoulder and upper traps. She was agreeable to dry needling treatment, and this resulted in several local twitch responses and reported decrease in pain end of session. PT reviewed the importance of HEP adherence moving forward and pt was understanding of this. She would continue to benefit from skilled PT in order to decrease pain, improve body mechanics and increase shoulder strength for full return to her daily activity.     Rehab Potential  Good    PT Frequency  2x / week    PT Duration  8 weeks    PT Treatment/Interventions  ADLs/Self Care Home Management;Cryotherapy;Electrical Stimulation;Iontophoresis 87m/ml Dexamethasone;Moist Heat;Traction;Functional mobility training;Therapeutic exercise;Therapeutic activities;Neuromuscular re-education;Patient/family education;Manual techniques;Dry needling;Passive range of motion;Taping;Spinal Manipulations;Joint Manipulations    PT Next Visit Plan  f/u on d/n; f/u on progressive muscle relaxation; self pec release and foam roll stretches; neural flossing    PT Home Exercise Plan  Access Code: [email or web address]       Patient will benefit from skilled therapeutic intervention in order to improve the following deficits and impairments:  Improper body mechanics, Pain, Impaired sensation, Decreased mobility, Increased muscle spasms, Impaired tone, Postural dysfunction, Decreased activity tolerance, Decreased strength, Hypomobility, Impaired UE functional use, Impaired flexibility  Visit Diagnosis: Muscle weakness  (generalized)  Chronic right shoulder pain     Problem List Patient Active Problem List   Diagnosis Date Noted  . Thoracic outlet syndrome 10/01/2018  . Myofascial pain 09/04/2017  . Bilateral carpal tunnel syndrome 09/04/2017  . Ankle impingement syndrome, left 09/04/2017  . Migraine with aura and without status migrainosus, not intractable 09/06/2015  . Anxiety and depression 11/05/2009  . Attention deficit disorder 11/05/2009   *addendum to resolve episode of care and d/c pt from PT  [place] Visits from Start of Care: 5  Current functional level related to goals / functional outcomes: See above for more details    Remaining deficits: See above for more details    Education / Equipment: See above for more details   Plan: Patient agrees to discharge.  Patient goals were partially met. Patient is being discharged due to not returning since the last visit.  ?????        12:53 PM,02/11/19 SSherol DadePT, DShady Daleat [facility] [address] [place] [id number]: [phone]  Fax:  [phone] Name: RKERRYN TENNANTMRN: [id number] of Birth: [birth year]

## 2019-01-11 ENCOUNTER — Other Ambulatory Visit: Payer: Self-pay | Admitting: Family Medicine

## 2019-01-11 DIAGNOSIS — M542 Cervicalgia: Secondary | ICD-10-CM

## 2019-01-13 MED ORDER — CYCLOBENZAPRINE HCL 5 MG PO TABS: 5.0000 mg | ORAL_TABLET | Freq: Every day | ORAL | 0 refills | Status: DC | PRN

## 2019-01-15 ENCOUNTER — Ambulatory Visit: Payer: No Typology Code available for payment source | Admitting: Family Medicine

## 2019-01-16 ENCOUNTER — Telehealth: Payer: Self-pay | Admitting: Physical Therapy

## 2019-01-16 ENCOUNTER — Ambulatory Visit: Payer: No Typology Code available for payment source | Admitting: Physical Therapy

## 2019-01-16 NOTE — Telephone Encounter (Signed)
No show. PT attempted to call pt regarding missed appointment, but unable to leave voicemail.   10:30 AM,01/16/19 [place], [place] [facility] [place]

## 2019-01-22 ENCOUNTER — Other Ambulatory Visit: Payer: Self-pay

## 2019-01-22 DIAGNOSIS — Z7184 Encounter for health counseling related to travel: Secondary | ICD-10-CM

## 2019-01-22 MED ORDER — ONDANSETRON HCL 4 MG PO TABS: 4.0000 mg | ORAL_TABLET | Freq: Three times a day (TID) | ORAL | 0 refills | Status: DC | PRN

## 2019-02-12 ENCOUNTER — Other Ambulatory Visit: Payer: Self-pay | Admitting: Family Medicine

## 2019-02-12 DIAGNOSIS — M542 Cervicalgia: Secondary | ICD-10-CM

## 2019-02-17 ENCOUNTER — Other Ambulatory Visit: Payer: Self-pay

## 2019-02-17 DIAGNOSIS — Z7184 Encounter for health counseling related to travel: Secondary | ICD-10-CM

## 2019-02-21 ENCOUNTER — Other Ambulatory Visit: Payer: Self-pay

## 2019-02-21 DIAGNOSIS — Z7184 Encounter for health counseling related to travel: Secondary | ICD-10-CM

## 2019-02-21 MED ORDER — ONDANSETRON HCL 4 MG PO TABS: 4.0000 mg | ORAL_TABLET | Freq: Three times a day (TID) | ORAL | 0 refills | Status: DC | PRN

## 2019-02-21 NOTE — Telephone Encounter (Signed)
This patient must have an office visit prior to any additional refills. Thanks.

## 2019-02-21 NOTE — Telephone Encounter (Signed)
Patient must have follow-up prior to any additional refills.

## 2019-03-17 ENCOUNTER — Other Ambulatory Visit: Payer: Self-pay | Admitting: Family Medicine

## 2019-03-17 DIAGNOSIS — M542 Cervicalgia: Secondary | ICD-10-CM

## 2019-03-17 NOTE — Telephone Encounter (Signed)
Last OV 12/31/18 Last refill 02/14/19 #30/0 Next OV not scheduled

## 2019-03-26 ENCOUNTER — Other Ambulatory Visit: Payer: Self-pay

## 2019-03-26 ENCOUNTER — Other Ambulatory Visit: Payer: Self-pay | Admitting: Family Medicine

## 2019-03-26 MED ORDER — ALBUTEROL SULFATE HFA 108 (90 BASE) MCG/ACT IN AERS: 2.0000 | INHALATION_SPRAY | Freq: Four times a day (QID) | RESPIRATORY_TRACT | 1 refills | Status: DC | PRN

## 2019-03-26 NOTE — Telephone Encounter (Signed)
Last fill 06/05/18 #1/0 Last OV 12/31/18

## 2019-03-26 NOTE — Progress Notes (Signed)
Refill request from Pharmacy.  Filled today.

## 2019-03-27 ENCOUNTER — Other Ambulatory Visit: Payer: Self-pay

## 2019-03-27 MED ORDER — BUTALBITAL-APAP-CAFFEINE 50-325-40 MG PO TABS: 1.0000 | ORAL_TABLET | Freq: Four times a day (QID) | ORAL | 3 refills | Status: DC | PRN

## 2019-04-11 ENCOUNTER — Other Ambulatory Visit: Payer: Self-pay | Admitting: Gastroenterology

## 2019-04-11 DIAGNOSIS — K259 Gastric ulcer, unspecified as acute or chronic, without hemorrhage or perforation: Secondary | ICD-10-CM

## 2019-04-19 ENCOUNTER — Other Ambulatory Visit: Payer: Self-pay | Admitting: Family Medicine

## 2019-04-19 DIAGNOSIS — M542 Cervicalgia: Secondary | ICD-10-CM

## 2019-04-20 ENCOUNTER — Encounter: Payer: Self-pay | Admitting: Family Medicine

## 2019-04-21 MED ORDER — CYCLOBENZAPRINE HCL 5 MG PO TABS: 5.0000 mg | ORAL_TABLET | Freq: Every day | ORAL | 0 refills | Status: DC | PRN

## 2019-04-22 ENCOUNTER — Encounter: Payer: Self-pay | Admitting: Physician Assistant

## 2019-04-23 ENCOUNTER — Other Ambulatory Visit: Payer: Self-pay

## 2019-04-23 ENCOUNTER — Ambulatory Visit: Payer: No Typology Code available for payment source | Admitting: Physician Assistant

## 2019-04-23 ENCOUNTER — Encounter: Payer: Self-pay | Admitting: Physician Assistant

## 2019-04-23 VITALS — BP 138/86 | HR 90 | Temp 98.7°F | Ht 66.0 in | Wt 157.0 lb

## 2019-04-23 DIAGNOSIS — M25511 Pain in right shoulder: Secondary | ICD-10-CM

## 2019-04-23 NOTE — Patient Instructions (Signed)
It was great to see you!  You will be contacted about your referral to [facility] (our physical therapist) and [name] (a sports medicine provider.)  Take care,  Inda Coke PA-C

## 2019-04-23 NOTE — Progress Notes (Signed)
[name] [name] is a 38 y.o. female here for a exisitng problem.  I acted as a Education administrator for [employer], PA-C Anselmo Pickler, LPN  History of Present Illness:   Chief Complaint  Patient presents with  . Shoulder Pain    HPI    Shoulder pain Pt c/o right shoulder pain, feels something catching. Started 3 weeks ago. Pt said this happened before but this is a lot worse. Pt has tried heat and ice no relief. Taking Tylenol for pain no relief. Has seen Dr. Paulla Fore in the past, and had dry needling/PT.  She has several allergies and cannot tolerate many anti-inflammatories. She has very limited ROM due to her pain and its affecting her job and parenting abilities.   Past Medical History:  Diagnosis Date  . ADHD (attention deficit hyperactivity disorder)   . Anxiety   . Depression   . Fibromyalgia   . Gallstones   . Gestational diabetes   . History of chicken pox   . History of condyloma acuminatum   . Migraines      Social History   Socioeconomic History  . Marital status: Married    Spouse name: Not on file  . Number of children: 2  . Years of education: Not on file  . Highest education level: Not on file  Occupational History  . Occupation: Doesn't work  Scientific laboratory technician  . Financial resource strain: Not on file  . Food insecurity    Worry: Not on file    Inability: Not on file  . Transportation needs    Medical: Not on file    Non-medical: Not on file  Tobacco Use  . Smoking status: Current Some Day Smoker  . Smokeless tobacco: Never Used  . Tobacco comment: smokes socially (1 cigarette a week)  Substance and Sexual Activity  . Alcohol use: No    Alcohol/week: 0.0 standard drinks  . Drug use: No  . Sexual activity: Yes  Lifestyle  . Physical activity    Days per week: Not on file    Minutes per session: Not on file  . Stress: Not on file  Relationships  . Social Herbalist on phone: Not on file    Gets together: Not on file    Attends religious  service: Not on file    Active member of club or organization: Not on file    Attends meetings of clubs or organizations: Not on file    Relationship status: Not on file  . Intimate partner violence    Fear of current or ex partner: Not on file    Emotionally abused: Not on file    Physically abused: Not on file    Forced sexual activity: Not on file  Other Topics Concern  . Not on file  Social History Narrative   2012- daughter   2016- son   Married   Does not work.  She was previously a Emergency planning/management officer, last worked in 2012.    Enjoys reading, shopping, beach    Past Surgical History:  Procedure Laterality Date  . CESAREAN SECTION  06/25/2011  . CESAREAN SECTION N/A 05/04/2015  . CHOLECYSTECTOMY N/A 07/07/2015  . ERCP N/A 08/24/2015  . KNEE CARTILAGE SURGERY Left     Family History  Problem Relation Age of Onset  . Hypertension Mother   . Migraines Mother   . Hypertension Father   . Migraines Sister   . Heart disease Maternal Grandmother   . Heart  disease Maternal Grandfather   . Diabetes Paternal Grandmother   . Prostate cancer Paternal Grandfather   . Esophageal cancer Paternal Grandfather   . Healthy Son   . Healthy Daughter   . Anesthesia problems Neg Hx   . Hypotension Neg Hx   . Malignant hyperthermia Neg Hx   . Pseudochol deficiency Neg Hx   . Colon cancer Neg Hx     Allergies  Allergen Reactions  . Ibuprofen Hives and Other (See Comments)    stuffiness  . Toradol [Ketorolac Tromethamine] Hives  . Aspirin Swelling  . Carafate [Sucralfate] Itching  . Clindamycin Hives and Itching  . Food     Nuts and Yeast  . Latex Hives  . Lovenox [Enoxaparin Sodium] Other (See Comments)    Face flush and redness  . Nsaids Hives  . Other Hives    CATS  . Penicillins Hives  . Tizanidine Itching  . Yellow Dyes (Non-Tartrazine) Hives    YELLOW No. 5  . Zanaflex [Tizanidine Hcl] Rash    Current Medications:   Current Outpatient Medications:  .  albuterol  (VENTOLIN HFA) 108 (90 Base) MCG/ACT inhaler, Inhale 2 puffs into the lungs every 6 (six) hours as needed for wheezing or shortness of breath., Disp: 18 g, Rfl: 1 .  ALPRAZolam (XANAX) 0.5 MG tablet, Take 0.5 mg by mouth 4 (four) times daily as needed for anxiety. , Disp: , Rfl: 5 .  amphetamine-dextroamphetamine (ADDERALL) 20 MG tablet, Take 20 mg by mouth 3 (three) times daily., Disp: , Rfl:  .  butalbital-acetaminophen-caffeine (FIORICET) 50-325-40 MG tablet, Take 1 tablet by mouth every 6 (six) hours as needed for headache., Disp: 20 tablet, Rfl: 3 .  cetirizine (ZYRTEC) 10 MG tablet, Take 10 mg by mouth daily., Disp: , Rfl:  .  cyclobenzaprine (FLEXERIL) 5 MG tablet, Take 1 tablet (5 mg total) by mouth daily as needed for muscle spasms., Disp: 30 tablet, Rfl: 0 .  DULoxetine (CYMBALTA) 60 MG capsule, Take 1 capsule (60 mg total) by mouth daily., Disp: 90 capsule, Rfl: 1 .  ondansetron (ZOFRAN) 4 MG tablet, Take 1 tablet (4 mg total) by mouth every 8 (eight) hours as needed for nausea or vomiting., Disp: 20 tablet, Rfl: 0 .  pantoprazole (PROTONIX) 40 MG tablet, TAKE 1 TABLET (40 MG TOTAL) BY MOUTH 2 (TWO) TIMES DAILY. USE PROTONIX 40 MG TWICE A DAY FOR 8 WEEKS., Disp: 60 tablet, Rfl: 3 .  pantoprazole (PROTONIX) 40 MG tablet, , Disp: , Rfl:    Review of Systems:   ROS Negative unless otherwise specified per HPI.  Vitals:   Vitals:   04/23/19 1305  BP: 138/86  Pulse: 90  Temp: 98.7 F (37.1 C)  TempSrc: Temporal  SpO2: 98%  Weight: 157 lb (71.2 kg)  Height: 5\' 6"  (1.676 m)     Body mass index is 25.34 kg/m.  Physical Exam:   Physical Exam Constitutional:      Appearance: She is well-developed.  HENT:     Head: Normocephalic and atraumatic.  Eyes:     Conjunctiva/sclera: Conjunctivae normal.  Neck:     Musculoskeletal: Normal range of motion and neck supple.  Pulmonary:     Effort: Pulmonary effort is normal.  Musculoskeletal: Normal range of motion.     Comments: R  shoulder  Shoulder exam: No deformity of shoulders on inspection. No pain with palpation of shoulder landmarks. Decreased ROM in abduction and forward flexion, cannot extend arm beyond neutral/0 degrees. Pain and weakness  with testing SITS in ext/int rotation.   Skin:    General: Skin is warm and dry.  Neurological:     Mental Status: She is alert and oriented to person, place, and time.     Comments: Normal sensation to b/l UE Grip strength 4/5 on R, 5/5 on L  Psychiatric:        Behavior: Behavior normal.        Thought Content: Thought content normal.        Judgment: Judgment normal.      Assessment and Plan:   Keshawn was seen today for shoulder pain.  Diagnoses and all orders for this visit:  Acute pain of right shoulder -     Ambulatory referral to Physical Therapy -     Ambulatory referral to Orthopedics   Possible shoulder strain -- likely needs u/s for further eval and treatment. She would also like PT. Referral place for Hilts and Lyndee Hensen.  . Reviewed expectations re: course of current medical issues. . Discussed self-management of symptoms. . Outlined signs and symptoms indicating need for more acute intervention. . Patient verbalized understanding and all questions were answered. . See orders for this visit as documented in the electronic medical record. . Patient received an After-Visit Summary.  CMA or LPN served as scribe during this visit. History, Physical, and Plan performed by medical provider. The above documentation has been reviewed and is accurate and complete.   Inda Coke, PA-C

## 2019-04-24 ENCOUNTER — Ambulatory Visit (INDEPENDENT_AMBULATORY_CARE_PROVIDER_SITE_OTHER): Payer: No Typology Code available for payment source | Admitting: Family Medicine

## 2019-04-24 ENCOUNTER — Encounter: Payer: Self-pay | Admitting: Family Medicine

## 2019-04-24 DIAGNOSIS — M25511 Pain in right shoulder: Secondary | ICD-10-CM

## 2019-04-24 DIAGNOSIS — G8929 Other chronic pain: Secondary | ICD-10-CM

## 2019-04-24 NOTE — Progress Notes (Signed)
Office Visit Note   Patient: [name] [name]           Date of Birth: 02/18/1981           MRN: [id number] Visit Date: 04/24/2019 Requested by: Inda Coke, [place] [address] [place],  [place] [address] PCP: Briscoe Deutscher, DO  Subjective: Chief Complaint  Patient presents with  . Right Shoulder - Pain    Anterior shoulder pain. Pops. Had been going to PT and having dry needling prior to covid-19.  Marland Kitchen Lower Back - Pain    SI joint pain    HPI: She is here with right shoulder pain.  History of intermittent pain in her shoulder, typically myofascial pain that responds to physical therapy with dry needling.  This time she does not seem to be getting better.  She feels like her shoulder pops intermittently and hurts on the anterior lateral aspect, making it hard to reach overhead.  She is using topical remedies but cannot take NSAIDs.  No trauma to her shoulder, just lots of repetitive activities.              ROS: No fevers or chills.  No radicular pain.  All other systems were reviewed and are negative.  Objective: Vital Signs: LMP 04/20/2019   Physical Exam:  General:  Alert and oriented, in no acute distress. Pulm:  Breathing unlabored. Psy:  Normal mood, congruent affect. Skin: No rash or erythema. Right shoulder: Decreased overhead reach compared to the left.  Occasional anterior crepitus.  Slight tenderness at the [facility] joint, no tenderness at the long head biceps tendon.  Moderate tenderness in the lateral subacromial space.  Isometric rotator cuff strength is 5/5 throughout with minimal pain.  Speeds test is negative, AC crossover test is negative.  Imaging: Diagnostic ultrasound right shoulder: Long head biceps tendon is located in its groove, no fluid in the sheath.  Subscapularis tendon has normal echotexture.  Supraspinatus tendon looks normal with no tears.  There is overlying thickening of the subacromial bursa.  Infraspinatus tendon looks normal.  AC joint has bony  irregularity consistent with mild arthritis.  Assessment & Plan: 1.  Right shoulder pain probably due to impingement/subacromial/subdeltoid bursitis. -Discussed options with her and elected to inject the subacromial space.  If still no relief, could consider MRI arthrogram to look for labrum tear.     Procedures: Right shoulder subacromial injection: After sterile prep with Betadine, injected 5 cc 1% lidocaine without epinephrine and 40 mg methylprednisolone into the subacromial bursa.  I used ultrasound to guide needle placement but did not bill for the guidance.    PMFS History: Patient Active Problem List   Diagnosis Date Noted  . Thoracic outlet syndrome 10/01/2018  . Myofascial pain 09/04/2017  . Bilateral carpal tunnel syndrome 09/04/2017  . Ankle impingement syndrome, left 09/04/2017  . Migraine with aura and without status migrainosus, not intractable 09/06/2015  . Anxiety and depression 11/05/2009  . Attention deficit disorder 11/05/2009   Past Medical History:  Diagnosis Date  . ADHD (attention deficit hyperactivity disorder)   . Anxiety   . Depression   . Fibromyalgia   . Gallstones   . Gestational diabetes   . History of chicken pox   . History of condyloma acuminatum   . Migraines     Family History  Problem Relation Age of Onset  . Hypertension Mother   . Migraines Mother   . Hypertension Father   . Migraines Sister   .  Heart disease Maternal Grandmother   . Heart disease Maternal Grandfather   . Diabetes Paternal Grandmother   . Prostate cancer Paternal Grandfather   . Esophageal cancer Paternal Grandfather   . Healthy Son   . Healthy Daughter   . Anesthesia problems Neg Hx   . Hypotension Neg Hx   . Malignant hyperthermia Neg Hx   . Pseudochol deficiency Neg Hx   . Colon cancer Neg Hx     Past Surgical History:  Procedure Laterality Date  . CESAREAN SECTION  06/25/2011  . CESAREAN SECTION N/A 05/04/2015  . CHOLECYSTECTOMY N/A 07/07/2015  .  ERCP N/A 08/24/2015  . KNEE CARTILAGE SURGERY Left    Social History   Occupational History  . Occupation: Doesn't work  Tobacco Use  . Smoking status: Current Some Day Smoker  . Smokeless tobacco: Never Used  . Tobacco comment: smokes socially (1 cigarette a week)  Substance and Sexual Activity  . Alcohol use: No    Alcohol/week: 0.0 standard drinks  . Drug use: No  . Sexual activity: Yes

## 2019-04-25 MED ORDER — TRAMADOL HCL 50 MG PO TABS: 50.0000 mg | ORAL_TABLET | Freq: Four times a day (QID) | ORAL | 0 refills | Status: DC | PRN

## 2019-04-29 NOTE — Addendum Note (Signed)
Addended by: Hortencia Pilar on: 04/29/2019 08:12 AM   Modules accepted: Orders

## 2019-05-07 ENCOUNTER — Encounter: Payer: Self-pay | Admitting: Physician Assistant

## 2019-05-07 DIAGNOSIS — Z7184 Encounter for health counseling related to travel: Secondary | ICD-10-CM

## 2019-05-07 MED ORDER — ONDANSETRON HCL 4 MG PO TABS: 4.0000 mg | ORAL_TABLET | Freq: Three times a day (TID) | ORAL | 0 refills | Status: DC | PRN

## 2019-05-07 MED ORDER — DULOXETINE HCL 60 MG PO CPEP: 60.0000 mg | ORAL_CAPSULE | Freq: Every day | ORAL | 1 refills | Status: DC

## 2019-05-08 ENCOUNTER — Ambulatory Visit: Payer: No Typology Code available for payment source | Admitting: Physical Therapy

## 2019-05-08 ENCOUNTER — Encounter: Payer: Self-pay | Admitting: Family Medicine

## 2019-05-09 MED ORDER — TRAMADOL HCL 50 MG PO TABS: 50.0000 mg | ORAL_TABLET | Freq: Every evening | ORAL | 0 refills | Status: DC | PRN

## 2019-05-09 MED ORDER — BACLOFEN 10 MG PO TABS: 5.0000 mg | ORAL_TABLET | Freq: Every evening | ORAL | 1 refills | Status: DC | PRN

## 2019-05-14 ENCOUNTER — Other Ambulatory Visit: Payer: Self-pay | Admitting: Family Medicine

## 2019-05-14 DIAGNOSIS — G8929 Other chronic pain: Secondary | ICD-10-CM

## 2019-05-15 ENCOUNTER — Encounter: Payer: Self-pay | Admitting: Physical Therapy

## 2019-05-15 ENCOUNTER — Ambulatory Visit (INDEPENDENT_AMBULATORY_CARE_PROVIDER_SITE_OTHER): Payer: No Typology Code available for payment source | Admitting: Physical Therapy

## 2019-05-15 ENCOUNTER — Other Ambulatory Visit: Payer: Self-pay

## 2019-05-15 DIAGNOSIS — G8929 Other chronic pain: Secondary | ICD-10-CM

## 2019-05-15 DIAGNOSIS — M25511 Pain in right shoulder: Secondary | ICD-10-CM

## 2019-05-16 ENCOUNTER — Ambulatory Visit: Payer: No Typology Code available for payment source

## 2019-05-16 ENCOUNTER — Ambulatory Visit (INDEPENDENT_AMBULATORY_CARE_PROVIDER_SITE_OTHER): Payer: No Typology Code available for payment source

## 2019-05-16 ENCOUNTER — Telehealth: Payer: Self-pay | Admitting: Family Medicine

## 2019-05-16 DIAGNOSIS — G8929 Other chronic pain: Secondary | ICD-10-CM

## 2019-05-16 DIAGNOSIS — M25511 Pain in right shoulder: Secondary | ICD-10-CM

## 2019-05-16 NOTE — Progress Notes (Signed)
Xray visit only: 3 view right shoulder xray taken today per Dr.Hilts (needs this for insurance authorization - she has MR arthrogram scheduled for later today).

## 2019-05-16 NOTE — Telephone Encounter (Signed)
Three-view x-rays right shoulder reveal AC joint degenerative change with chondrocalcinosis.  Glenohumeral joint has good joint spacing but possible chondrocalcinosis as well.  No other abnormality seen.

## 2019-05-20 ENCOUNTER — Inpatient Hospital Stay: Admission: RE | Admit: 2019-05-20 | Payer: No Typology Code available for payment source | Source: Ambulatory Visit

## 2019-05-20 ENCOUNTER — Other Ambulatory Visit: Payer: No Typology Code available for payment source

## 2019-05-21 ENCOUNTER — Other Ambulatory Visit: Payer: Self-pay | Admitting: Family Medicine

## 2019-05-22 MED ORDER — TRAMADOL HCL 50 MG PO TABS: 50.0000 mg | ORAL_TABLET | Freq: Every evening | ORAL | 0 refills | Status: DC | PRN

## 2019-05-22 NOTE — Telephone Encounter (Signed)
This is a [facility] pt.  

## 2019-05-22 NOTE — Telephone Encounter (Signed)
Can you advise since Dr Junius Roads out of office the rest of the week?

## 2019-05-26 ENCOUNTER — Encounter: Payer: Self-pay | Admitting: Family Medicine

## 2019-05-26 ENCOUNTER — Other Ambulatory Visit: Payer: Self-pay | Admitting: Family Medicine

## 2019-05-26 DIAGNOSIS — M542 Cervicalgia: Secondary | ICD-10-CM

## 2019-05-26 DIAGNOSIS — G8929 Other chronic pain: Secondary | ICD-10-CM

## 2019-05-26 DIAGNOSIS — R200 Anesthesia of skin: Secondary | ICD-10-CM

## 2019-05-27 ENCOUNTER — Encounter: Payer: Self-pay | Admitting: Physical Therapy

## 2019-05-27 MED ORDER — CYCLOBENZAPRINE HCL 5 MG PO TABS: 5.0000 mg | ORAL_TABLET | Freq: Every day | ORAL | 0 refills | Status: DC | PRN

## 2019-05-27 NOTE — Therapy (Signed)
[facility] [address] [place], [place], [address] Phone: [phone]   Fax:  [phone]  Physical Therapy Evaluation  Patient Details  Name: [name] [name] MRN: [id number] Date of Birth: 11/10/1980 Referring Provider (PT): Inda Coke   Encounter Date: 05/15/2019  PT End of Session - 05/27/19 0911    Visit Number  1    Number of Visits  12    Date for PT Re-Evaluation  06/26/19    Authorization Type  [employer]    PT Start Time  1020    PT Stop Time  1055    PT Time Calculation (min)  35 min    Activity Tolerance  Patient tolerated treatment well;Patient limited by pain    Behavior During Therapy  [facility] for tasks assessed/performed       Past Medical History:  Diagnosis Date  . ADHD (attention deficit hyperactivity disorder)   . Anxiety   . Depression   . Fibromyalgia   . Gallstones   . Gestational diabetes   . History of chicken pox   . History of condyloma acuminatum   . Migraines     Past Surgical History:  Procedure Laterality Date  . CESAREAN SECTION  06/25/2011  . CESAREAN SECTION N/A 05/04/2015  . CHOLECYSTECTOMY N/A 07/07/2015  . ERCP N/A 08/24/2015  . KNEE CARTILAGE SURGERY Left     There were no vitals filed for this visit.   Subjective Assessment - 05/27/19 0910    Subjective  Pt states ongoing pain in R shoulder. She is R handed. Has had previous PT. She states pain radiating into R UE at times. She feels shoulder is still very painful eventhough quite a bit of time has passed. Has MRA scheduled for next week.    Limitations  Lifting;House hold activities    Patient Stated Goals  Decreased pain    Currently in Pain?  Yes    Pain Score  6     Pain Location  Shoulder    Pain Orientation  Right    Pain Descriptors / Indicators  Aching    Pain Type  Chronic pain    Pain Onset  More than a month ago    Pain Frequency  Intermittent    Aggravating Factors   in afternoon and evening         [facility] PT  Assessment - 05/27/19 0001      Assessment   Medical Diagnosis  R shoulder pain    Referring Provider (PT)  Inda Coke    Hand Dominance  Right    Prior Therapy  yes, few visits      Balance Screen   Has the patient fallen in the past 6 months  No      Prior Function   Level of Independence  Independent      Cognition   Overall Cognitive Status  Within Functional Limits for tasks assessed      ROM / Strength   AROM / PROM / Strength  AROM;Strength      AROM   Overall AROM Comments  Elevation: 120, pain,  Rotation: WFL      Strength   Overall Strength Comments  Flex/abd 4-/5 at mid range,  IR/ER: 4/5       Special Tests   Other special tests  + load shift, Painful with full elevation                Objective measurements completed on examination: See above  findings.              PT Education - 05/27/19 0911    Education Details  PT POC    Person(s) Educated  Patient    Methods  Explanation    Comprehension  Verbalized understanding       PT Short Term Goals - 05/27/19 0912      PT SHORT TERM GOAL #1   Title  Pt to be independent with initial HEP    Time  2    Period  Weeks    Status  New    Target Date  05/29/19        PT Long Term Goals - 05/27/19 0912      PT LONG TERM GOAL #1   Title  Pt to be independent with final HEP    Time  6    Period  Weeks    Target Date  06/26/19      PT LONG TERM GOAL #2   Title  Pt to demo full R shoulder AROM without pain , to improve ability for ADLs.    Time  6    Period  Weeks    Status  New    Target Date  06/26/19      PT LONG TERM GOAL #3   Title  Pt to demo improved R shoulder and scapular strength to at least 4+/5 to improve ability for IADLs.    Time  6    Period  Weeks    Status  New    Target Date  06/26/19      PT LONG TERM GOAL #4   Title  Pt to demo decreased pain in R shoulder to 0-2/10 with activity    Time  6    Period  Days    Status  New    Target Date  06/26/19              Plan - 05/27/19 [id number]    Clinical Impression Statement  Pt presents with primary complaint of increased pain in R shoulder. She has decreased ability for full AROM due to pain and catching, and decreased strength and stability for shoulder and scapular muscles. She has decreased ability for reaching, lifting, carrying, as wel as weight bearing activites. Pt scheduled for imaging next week that i feel will be helpful, due to ongoing time frame of pain. Pt to benefit from skilled care to improve deficits and return to PLOF.    Personal Factors and Comorbidities  Time since onset of injury/illness/exacerbation    Examination-Activity Limitations  Lift;Reach Overhead    Examination-Participation Restrictions  Cleaning;Shop;Community Activity;Yard Work;Driving;Laundry;Meal Prep    Stability/Clinical Decision Making  Stable/Uncomplicated    Clinical Decision Making  Low    Rehab Potential  Good    PT Frequency  2x / week    PT Duration  6 weeks    PT Treatment/Interventions  ADLs/Self Care Home Management;Cryotherapy;Electrical Stimulation;Iontophoresis 4mg /ml Dexamethasone;Moist Heat;Therapeutic exercise;Therapeutic activities;Functional mobility training;Ultrasound;Neuromuscular re-education;Patient/family education;Orthotic Fit/Training;Manual techniques;Passive range of motion;Dry needling;Taping;Vasopneumatic Device;Joint Manipulations    Consulted and Agree with Plan of Care  Patient       Patient will benefit from skilled therapeutic intervention in order to improve the following deficits and impairments:  Decreased endurance, Decreased activity tolerance, Decreased strength, Impaired UE functional use, Pain, Increased muscle spasms, Decreased range of motion  Visit Diagnosis: Chronic right shoulder pain     Problem List Patient Active Problem List   Diagnosis Date Noted  .  Thoracic outlet syndrome 10/01/2018  . Myofascial pain 09/04/2017  . Bilateral carpal tunnel  syndrome 09/04/2017  . Ankle impingement syndrome, left 09/04/2017  . Migraine with aura and without status migrainosus, not intractable 09/06/2015  . Anxiety and depression 11/05/2009  . Attention deficit disorder 11/05/2009    Lyndee Hensen, PT, DPT 9:25 AM  05/27/19    [facility] [place], [place], [address] Phone: [phone]   Fax:  [phone]  Name: [name] [name] MRN: [id number] Date of Birth: [birth year]

## 2019-05-28 ENCOUNTER — Other Ambulatory Visit: Payer: Self-pay

## 2019-05-28 ENCOUNTER — Encounter: Payer: Self-pay | Admitting: Physical Therapy

## 2019-05-28 ENCOUNTER — Ambulatory Visit (INDEPENDENT_AMBULATORY_CARE_PROVIDER_SITE_OTHER): Payer: No Typology Code available for payment source | Admitting: Physical Therapy

## 2019-05-28 DIAGNOSIS — G8929 Other chronic pain: Secondary | ICD-10-CM | POA: Diagnosis not present

## 2019-05-28 DIAGNOSIS — M25511 Pain in right shoulder: Secondary | ICD-10-CM | POA: Diagnosis not present

## 2019-05-28 NOTE — Patient Instructions (Signed)
Access Code: [email or web address]  URL: [email or web address]  Date: 05/28/2019  Prepared by: Lyndee Hensen   Exercises Scapular Retraction with Resistance - 10 reps - 2 sets - 1x daily Shoulder Internal Rotation with Resistance - 10 reps - 2 sets - 1x daily Shoulder External Rotation with Anchored Resistance - 10 reps - 2 sets - 1x daily Supine Shoulder Flexion Extension AAROM with Dowel - 10 reps - 1-2 sets - 1x daily Supine Pectoralis Stretch - 10 reps - 2 sets - 1x daily

## 2019-05-28 NOTE — Therapy (Signed)
[facility] [address] [place], [place], [address] Phone: [phone]   Fax:  [phone]  Physical Therapy Treatment  Patient Details  Name: [name] [name] MRN: [id number] Date of Birth: 1981/01/29 Referring Provider (PT): Inda Coke   Encounter Date: 05/28/2019  PT End of Session - 05/28/19 1503    Visit Number  2    Number of Visits  12    Date for PT Re-Evaluation  06/26/19    Authorization Type  UHC    PT Start Time  1430    PT Stop Time  1514    PT Time Calculation (min)  44 min    Activity Tolerance  Patient tolerated treatment well;Patient limited by pain    Behavior During Therapy  [facility] for tasks assessed/performed       Past Medical History:  Diagnosis Date  . ADHD (attention deficit hyperactivity disorder)   . Anxiety   . Depression   . Fibromyalgia   . Gallstones   . Gestational diabetes   . History of chicken pox   . History of condyloma acuminatum   . Migraines     Past Surgical History:  Procedure Laterality Date  . CESAREAN SECTION  06/25/2011  . CESAREAN SECTION N/A 05/04/2015  . CHOLECYSTECTOMY N/A 07/07/2015  . ERCP N/A 08/24/2015  . KNEE CARTILAGE SURGERY Left     There were no vitals filed for this visit.  Subjective Assessment - 05/28/19 1543    Subjective  Pt states quite a bit of pain in shoulder and into UE in last couple days. She is waiting for MRI approval.    Currently in Pain?  Yes    Pain Score  6     Pain Location  Shoulder    Pain Orientation  Right    Pain Descriptors / Indicators  Aching    Pain Type  Chronic pain    Pain Onset  More than a month ago    Pain Frequency  Intermittent                       [facility] Adult PT Treatment/Exercise - 05/28/19 0001      Exercises   Exercises  Shoulder      Shoulder Exercises: Supine   Flexion  AAROM;15 reps    Shoulder Flexion Weight (lbs)  2      Shoulder Exercises: Standing   Protraction  15 reps    Protraction Weight (lbs)  2/cane    External Rotation  15 reps    Theraband Level (Shoulder External Rotation)  Level 3 (Green)    Internal Rotation  15 reps    Theraband Level (Shoulder Internal Rotation)  Level 3 (Green)    Row  20 reps    Theraband Level (Shoulder Row)  Level 3 (Green)    Other Standing Exercises  Wall slides x15 on R;     Other Standing Exercises  wall push ups (pain)       Shoulder Exercises: Pulleys   Flexion  2 minutes      Shoulder Exercises: ROM/Strengthening   UBE (Upper Arm Bike)  Fluid/ x4 min fwd       Manual Therapy   Manual Therapy  Taping    Kinesiotex  Ligament Correction      Kinesiotix   Ligament Correction  3 i strips for shoulder stability              PT Education - 05/28/19  1503    Education Details  HEP    Person(s) Educated  Patient    Methods  Explanation;Handout;Demonstration    Comprehension  Verbalized understanding;Returned demonstration;Need further instruction       PT Short Term Goals - 05/27/19 0912      PT SHORT TERM GOAL #1   Title  Pt to be independent with initial HEP    Time  2    Period  Weeks    Status  New    Target Date  05/29/19        PT Long Term Goals - 05/27/19 0912      PT LONG TERM GOAL #1   Title  Pt to be independent with final HEP    Time  6    Period  Weeks    Target Date  06/26/19      PT LONG TERM GOAL #2   Title  Pt to demo full R shoulder AROM without pain , to improve ability for ADLs.    Time  6    Period  Weeks    Status  New    Target Date  06/26/19      PT LONG TERM GOAL #3   Title  Pt to demo improved R shoulder and scapular strength to at least 4+/5 to improve ability for IADLs.    Time  6    Period  Weeks    Status  New    Target Date  06/26/19      PT LONG TERM GOAL #4   Title  Pt to demo decreased pain in R shoulder to 0-2/10 with activity    Time  6    Period  Days    Status  New    Target Date  06/26/19            Plan - 05/28/19 1544     Clinical Impression Statement  Pt able to perform light ther ex today without much increased pain. Pt requires max cuing for correct performance of exercises, and for postural cueing.Recommended pt perform HEP (ther ex done today) for improving strenght and mobility. Pt has felt frustrated bc shoulder has been painful for so long, that she has not been doing much ther ex for it. Plan to progress as tolerated.    Personal Factors and Comorbidities  Time since onset of injury/illness/exacerbation    Examination-Activity Limitations  Lift;Reach Overhead    Examination-Participation Restrictions  Cleaning;Shop;Community Activity;Yard Work;Driving;Laundry;Meal Prep    Stability/Clinical Decision Making  Stable/Uncomplicated    Rehab Potential  Good    PT Frequency  2x / week    PT Duration  6 weeks    PT Treatment/Interventions  ADLs/Self Care Home Management;Cryotherapy;Electrical Stimulation;Iontophoresis 4mg /ml Dexamethasone;Moist Heat;Therapeutic exercise;Therapeutic activities;Functional mobility training;Ultrasound;Neuromuscular re-education;Patient/family education;Orthotic Fit/Training;Manual techniques;Passive range of motion;Dry needling;Taping;Vasopneumatic Device;Joint Manipulations    Consulted and Agree with Plan of Care  Patient       Patient will benefit from skilled therapeutic intervention in order to improve the following deficits and impairments:  Decreased endurance, Decreased activity tolerance, Decreased strength, Impaired UE functional use, Pain, Increased muscle spasms, Decreased range of motion  Visit Diagnosis: Chronic right shoulder pain     Problem List Patient Active Problem List   Diagnosis Date Noted  . Thoracic outlet syndrome 10/01/2018  . Myofascial pain 09/04/2017  . Bilateral carpal tunnel syndrome 09/04/2017  . Ankle impingement syndrome, left 09/04/2017  . Migraine with aura and without status migrainosus, not intractable 09/06/2015  .  Anxiety and  depression 11/05/2009  . Attention deficit disorder 11/05/2009    Lyndee Hensen, PT, DPT 3:51 PM  05/28/19    [facility] [place], [place], [address] Phone: [phone]   Fax:  [phone]  Name: [name] [name] MRN: [id number] Date of Birth: October 30, 1980

## 2019-05-31 ENCOUNTER — Other Ambulatory Visit: Payer: Self-pay | Admitting: Family Medicine

## 2019-06-02 ENCOUNTER — Other Ambulatory Visit: Payer: Self-pay | Admitting: Orthopaedic Surgery

## 2019-06-03 MED ORDER — TRAMADOL HCL 50 MG PO TABS: 50.0000 mg | ORAL_TABLET | Freq: Every evening | ORAL | 0 refills | Status: DC | PRN

## 2019-06-03 NOTE — Telephone Encounter (Signed)
[facility] patient.  

## 2019-06-03 NOTE — Telephone Encounter (Signed)
This is one or [name] patients.

## 2019-06-03 NOTE — Telephone Encounter (Signed)
[facility] patient 

## 2019-06-03 NOTE — Telephone Encounter (Signed)
Please advise 

## 2019-06-05 ENCOUNTER — Encounter: Payer: Self-pay | Admitting: Family Medicine

## 2019-06-05 ENCOUNTER — Other Ambulatory Visit: Payer: Self-pay

## 2019-06-05 ENCOUNTER — Ambulatory Visit (INDEPENDENT_AMBULATORY_CARE_PROVIDER_SITE_OTHER): Payer: No Typology Code available for payment source | Admitting: Physical Therapy

## 2019-06-05 ENCOUNTER — Encounter: Payer: Self-pay | Admitting: Physical Therapy

## 2019-06-05 DIAGNOSIS — M25511 Pain in right shoulder: Secondary | ICD-10-CM | POA: Diagnosis not present

## 2019-06-05 DIAGNOSIS — G8929 Other chronic pain: Secondary | ICD-10-CM

## 2019-06-05 NOTE — Therapy (Signed)
[facility] [address] [place], [place], [address] Phone: [phone]   Fax:  [phone]  Physical Therapy Treatment  Patient Details  Name: [name] [name] MRN: [id number] Date of Birth: 12/17/1980 Referring Provider (PT): Inda Coke   Encounter Date: 06/05/2019  PT End of Session - 06/05/19 1033    Visit Number  3    Number of Visits  12    Date for PT Re-Evaluation  06/26/19    Authorization Type  UHC    PT Start Time  1016    PT Stop Time  1056    PT Time Calculation (min)  40 min    Activity Tolerance  Patient tolerated treatment well;Patient limited by pain    Behavior During Therapy  [facility] for tasks assessed/performed       Past Medical History:  Diagnosis Date  . ADHD (attention deficit hyperactivity disorder)   . Anxiety   . Depression   . Fibromyalgia   . Gallstones   . Gestational diabetes   . History of chicken pox   . History of condyloma acuminatum   . Migraines     Past Surgical History:  Procedure Laterality Date  . CESAREAN SECTION  06/25/2011  . CESAREAN SECTION N/A 05/04/2015  . CHOLECYSTECTOMY N/A 07/07/2015  . ERCP N/A 08/24/2015  . KNEE CARTILAGE SURGERY Left     There were no vitals filed for this visit.  Subjective Assessment - 06/05/19 1027    Subjective  Pt states taping helped pain some. MRI is approved but not scheduled.    Currently in Pain?  Yes    Pain Score  6     Pain Location  Shoulder    Pain Orientation  Right    Pain Descriptors / Indicators  Aching    Pain Type  Chronic pain    Pain Onset  More than a month ago    Pain Frequency  Intermittent                       [facility] Adult PT Treatment/Exercise - 06/05/19 1033      Exercises   Exercises  Shoulder      Shoulder Exercises: Supine   Horizontal ABduction  20 reps    Horizontal ABduction Weight (lbs)  2    Flexion  AAROM;20 reps    Shoulder Flexion Weight (lbs)  2      Shoulder Exercises:  Seated   Protraction  20 reps    Protraction Weight (lbs)  2    Protraction Limitations   SA punch       Shoulder Exercises: Standing   Protraction  --    Protraction Weight (lbs)  --    External Rotation  15 reps    Theraband Level (Shoulder External Rotation)  Level 3 (Green)    Internal Rotation  15 reps    Theraband Level (Shoulder Internal Rotation)  Level 3 (Green)    Row  20 reps    Theraband Level (Shoulder Row)  Level 3 (Green)    Other Standing Exercises  --    Other Standing Exercises  wall push ups x20;       Shoulder Exercises: Pulleys   Flexion  --      Shoulder Exercises: ROM/Strengthening   UBE (Upper Arm Bike)  --      Shoulder Exercises: Body Blade   Flexion  30 seconds;1 rep    ABduction  30 seconds;1 rep  External Rotation  30 seconds;1 rep      Manual Therapy   Manual Therapy  Taping    Kinesiotex  Ligament Correction      Kinesiotix   Ligament Correction  3 i strips for shoulder stability                PT Short Term Goals - 05/27/19 0912      PT SHORT TERM GOAL #1   Title  Pt to be independent with initial HEP    Time  2    Period  Weeks    Status  New    Target Date  05/29/19        PT Long Term Goals - 05/27/19 0912      PT LONG TERM GOAL #1   Title  Pt to be independent with final HEP    Time  6    Period  Weeks    Target Date  06/26/19      PT LONG TERM GOAL #2   Title  Pt to demo full R shoulder AROM without pain , to improve ability for ADLs.    Time  6    Period  Weeks    Status  New    Target Date  06/26/19      PT LONG TERM GOAL #3   Title  Pt to demo improved R shoulder and scapular strength to at least 4+/5 to improve ability for IADLs.    Time  6    Period  Weeks    Status  New    Target Date  06/26/19      PT LONG TERM GOAL #4   Title  Pt to demo decreased pain in R shoulder to 0-2/10 with activity    Time  6    Period  Days    Status  New    Target Date  06/26/19            Plan -  06/05/19 1111    Clinical Impression Statement  Pt with improved ability for ther ex today, but does have pain with rotation and reaching. Taping applied at beginning of session today to help with shouder mechanics and pain. Pt able to do light strengthening, but limited with reaching, lifting and IADLs with larger ROM, due to pain.    Personal Factors and Comorbidities  Time since onset of injury/illness/exacerbation    Examination-Activity Limitations  Lift;Reach Overhead    Examination-Participation Restrictions  Cleaning;Shop;Community Activity;Yard Work;Driving;Laundry;Meal Prep    Stability/Clinical Decision Making  Stable/Uncomplicated    Rehab Potential  Good    PT Frequency  2x / week    PT Duration  6 weeks    PT Treatment/Interventions  ADLs/Self Care Home Management;Cryotherapy;Electrical Stimulation;Iontophoresis 4mg /ml Dexamethasone;Moist Heat;Therapeutic exercise;Therapeutic activities;Functional mobility training;Ultrasound;Neuromuscular re-education;Patient/family education;Orthotic Fit/Training;Manual techniques;Passive range of motion;Dry needling;Taping;Vasopneumatic Device;Joint Manipulations    Consulted and Agree with Plan of Care  Patient       Patient will benefit from skilled therapeutic intervention in order to improve the following deficits and impairments:  Decreased endurance, Decreased activity tolerance, Decreased strength, Impaired UE functional use, Pain, Increased muscle spasms, Decreased range of motion  Visit Diagnosis: Chronic right shoulder pain     Problem List Patient Active Problem List   Diagnosis Date Noted  . Thoracic outlet syndrome 10/01/2018  . Myofascial pain 09/04/2017  . Bilateral carpal tunnel syndrome 09/04/2017  . Ankle impingement syndrome, left 09/04/2017  . Migraine with aura and without status migrainosus,  not intractable 09/06/2015  . Anxiety and depression 11/05/2009  . Attention deficit disorder 11/05/2009    Lyndee Hensen, PT, DPT 12:01 PM  06/05/19    [place], [place], [address] Phone: [phone]   Fax:  [phone]  Name: [name] [name] MRN: [id number] Date of Birth: 1981/06/24

## 2019-06-10 ENCOUNTER — Ambulatory Visit (INDEPENDENT_AMBULATORY_CARE_PROVIDER_SITE_OTHER): Payer: No Typology Code available for payment source | Admitting: Physical Therapy

## 2019-06-10 ENCOUNTER — Other Ambulatory Visit: Payer: Self-pay

## 2019-06-10 ENCOUNTER — Encounter: Payer: Self-pay | Admitting: Physical Therapy

## 2019-06-10 DIAGNOSIS — G8929 Other chronic pain: Secondary | ICD-10-CM | POA: Diagnosis not present

## 2019-06-10 DIAGNOSIS — M25511 Pain in right shoulder: Secondary | ICD-10-CM | POA: Diagnosis not present

## 2019-06-10 NOTE — Therapy (Addendum)
[facility] [address] [place], [place], [address] Phone: [phone]   Fax:  [phone]  Physical Therapy Treatment  Patient Details  Name: ALYCEN MACK MRN: [id number] Date of Birth: 1981/08/28 Referring Provider (PT): Inda Coke   Encounter Date: 06/10/2019  PT End of Session - 06/10/19 1108    Visit Number  4    Number of Visits  12    Date for PT Re-Evaluation  06/26/19    Authorization Type  [employer]    PT Start Time  1102    PT Stop Time  1141    PT Time Calculation (min)  39 min    Activity Tolerance  Patient tolerated treatment well;Patient limited by pain    Behavior During Therapy  [facility] for tasks assessed/performed       Past Medical History:  Diagnosis Date  . ADHD (attention deficit hyperactivity disorder)   . Anxiety   . Depression   . Fibromyalgia   . Gallstones   . Gestational diabetes   . History of chicken pox   . History of condyloma acuminatum   . Migraines     Past Surgical History:  Procedure Laterality Date  . CESAREAN SECTION  06/25/2011  . CESAREAN SECTION N/A 05/04/2015  . CHOLECYSTECTOMY N/A 07/07/2015  . ERCP N/A 08/24/2015  . KNEE CARTILAGE SURGERY Left     There were no vitals filed for this visit.  Subjective Assessment - 06/10/19 1107    Subjective  Pt states slight improved pain since last week.Having nerve conduction test next week.    Currently in Pain?  Yes    Pain Score  4     Pain Location  Shoulder    Pain Orientation  Right    Pain Descriptors / Indicators  Aching    Pain Type  Chronic pain    Pain Onset  More than a month ago    Pain Frequency  Intermittent                       [facility] Adult PT Treatment/Exercise - 06/10/19 1109      Exercises   Exercises  Shoulder      Shoulder Exercises: Supine   Horizontal ABduction  20 reps    Horizontal ABduction Weight (lbs)  2    Flexion  20 reps;AROM    Shoulder Flexion Weight (lbs)  2    Other  Supine Exercises  Chest press 5 lb x15;       Shoulder Exercises: Standing   Protraction  20 reps    Protraction Weight (lbs)  3lb    External Rotation  20 reps    Theraband Level (Shoulder External Rotation)  Level 3 (Green)    Internal Rotation  20 reps    Theraband Level (Shoulder Internal Rotation)  Level 3 (Green)    Row  20 reps    Theraband Level (Shoulder Row)  Level 3 (Green)    Other Standing Exercises  wall push ups x20;       Shoulder Exercises: ROM/Strengthening   UBE (Upper Arm Bike)  Fluid/ x 5 min fwd       Shoulder Exercises: Body Blade   Flexion  30 seconds;1 rep    ABduction  30 seconds;1 rep    External Rotation  30 seconds;1 rep      Manual Therapy   Manual Therapy  Taping    Kinesiotex  Ligament Correction  Kinesiotix   Ligament Correction  3 i strips for shoulder stability                PT Short Term Goals - 06/10/19 1108      PT SHORT TERM GOAL #1   Title  Pt to be independent with initial HEP    Time  2    Period  Weeks    Status  Achieved    Target Date  05/29/19        PT Long Term Goals - 05/27/19 0912      PT LONG TERM GOAL #1   Title  Pt to be independent with final HEP    Time  6    Period  Weeks    Target Date  06/26/19      PT LONG TERM GOAL #2   Title  Pt to demo full R shoulder AROM without pain , to improve ability for ADLs.    Time  6    Period  Weeks    Status  New    Target Date  06/26/19      PT LONG TERM GOAL #3   Title  Pt to demo improved R shoulder and scapular strength to at least 4+/5 to improve ability for IADLs.    Time  6    Period  Weeks    Status  New    Target Date  06/26/19      PT LONG TERM GOAL #4   Title  Pt to demo decreased pain in R shoulder to 0-2/10 with activity    Time  6    Period  Days    Status  New    Target Date  06/26/19            Plan - 06/10/19 1245    Clinical Impression Statement  Pt with improved soreness with activities today. Taping  has helped  stability and soreness with movment. Pt being more consious of movements and shoulder positioning at home. Pt having nerve test next week, will be seen 1x next week, until results known. Pt to benefit from continued care.    Personal Factors and Comorbidities  Time since onset of injury/illness/exacerbation    Examination-Activity Limitations  Lift;Reach Overhead    Examination-Participation Restrictions  Cleaning;Shop;Community Activity;Yard Work;Driving;Laundry;Meal Prep    Stability/Clinical Decision Making  Stable/Uncomplicated    Rehab Potential  Good    PT Frequency  2x / week    PT Duration  6 weeks    PT Treatment/Interventions  ADLs/Self Care Home Management;Cryotherapy;Electrical Stimulation;Iontophoresis 40m/ml Dexamethasone;Moist Heat;Therapeutic exercise;Therapeutic activities;Functional mobility training;Ultrasound;Neuromuscular re-education;Patient/family education;Orthotic Fit/Training;Manual techniques;Passive range of motion;Dry needling;Taping;Vasopneumatic Device;Joint Manipulations    Consulted and Agree with Plan of Care  Patient       Patient will benefit from skilled therapeutic intervention in order to improve the following deficits and impairments:  Decreased endurance, Decreased activity tolerance, Decreased strength, Impaired UE functional use, Pain, Increased muscle spasms, Decreased range of motion  Visit Diagnosis: Chronic right shoulder pain     Problem List Patient Active Problem List   Diagnosis Date Noted  . Thoracic outlet syndrome 10/01/2018  . Myofascial pain 09/04/2017  . Bilateral carpal tunnel syndrome 09/04/2017  . Ankle impingement syndrome, left 09/04/2017  . Migraine with aura and without status migrainosus, not intractable 09/06/2015  . Anxiety and depression 11/05/2009  . Attention deficit disorder 11/05/2009    LLyndee Hensen PT, DPT 12:47 PM  06/10/19    [facility]  [place], [place], [address] Phone: [phone]   Fax:  [phone]  Name: ELNA RADOVICH MRN: [id number] Date of Birth: 07-25-1981   PHYSICAL THERAPY DISCHARGE SUMMARY  Visits from Start of Care: 4 Plan: Patient agrees to discharge.  Patient goals were not met. Patient is being discharged due to not returning since the last visit.  ?????     Pt did not return to PT. She is having continued pain, following up with MD.   Lyndee Hensen, PT, DPT 11:55 AM  09/10/19

## 2019-06-16 ENCOUNTER — Other Ambulatory Visit: Payer: Self-pay | Admitting: Family Medicine

## 2019-06-16 NOTE — Telephone Encounter (Signed)
rxrf request

## 2019-06-17 ENCOUNTER — Encounter: Payer: No Typology Code available for payment source | Admitting: Physical Therapy

## 2019-06-17 MED ORDER — TRAMADOL HCL 50 MG PO TABS: 50.0000 mg | ORAL_TABLET | Freq: Every evening | ORAL | 0 refills | Status: DC | PRN

## 2019-06-18 ENCOUNTER — Other Ambulatory Visit: Payer: Self-pay

## 2019-06-18 ENCOUNTER — Ambulatory Visit (INDEPENDENT_AMBULATORY_CARE_PROVIDER_SITE_OTHER): Payer: No Typology Code available for payment source | Admitting: Physical Medicine and Rehabilitation

## 2019-06-18 ENCOUNTER — Encounter: Payer: Self-pay | Admitting: Physical Medicine and Rehabilitation

## 2019-06-18 DIAGNOSIS — R202 Paresthesia of skin: Secondary | ICD-10-CM

## 2019-06-18 NOTE — Progress Notes (Signed)
 .  Numeric Pain Rating Scale and Functional Assessment Average Pain 0   In the last MONTH (on 0-10 scale) has pain interfered with the following?  1. General activity like being  able to carry out your everyday physical activities such as walking, climbing stairs, carrying groceries, or moving a chair?  Rating(8)     

## 2019-06-19 NOTE — Progress Notes (Signed)
[name] [name] - 38 y.o. female MRN [id number]  Date of birth: 30-Apr-1981  Office Visit Note: Visit Date: 06/18/2019 PCP: Briscoe Deutscher, DO Referred by: Briscoe Deutscher, DO  Subjective: Chief Complaint  Patient presents with  . Right Arm - Numbness, Other  . Right Hand - Numbness, Other   HPI: [name] [name] is a 38 y.o. female who comes in today At the request of Dr. Eunice Blase for electrodiagnostic study of the right upper limb.  Patient is right-hand dominant and does not endorse much in the way of pain but has numbness and stiffness in the right arm and right hand particularly the thumb and index finger.  She reports this started about a month ago and is really been difficult when trying to sleep on the right side it tends to get numb.  Gets numb with driving.  No left-sided complaints.  No frank radicular symptoms.  Patient has had no prior electrodiagnostic studies or cervical spine surgery.  Her case is complicated by ADHD, depression and anxiety as well as fibromyalgia.  She has multiple drug intolerances.  ROS Otherwise per HPI.  Assessment & Plan: Visit Diagnoses:  1. Paresthesia of skin     Plan: Impression: The above electrodiagnostic study is ABNORMAL and reveals evidence of a mild to moderate right median nerve entrapment at the wrist (carpal tunnel syndrome) affecting sensory components.  This likely would not explain the totality of her symptoms.  There is no significant electrodiagnostic evidence of any other focal nerve entrapment, brachial plexopathy or cervical radiculopathy.    As you know, this particular electrodiagnostic study cannot rule out chemical radiculitis or sensory only radiculopathy. **This electrodiagnostic study cannot rule out small fiber polyneuropathy and dysesthesias from central pain syndromes such as stroke or central pain sensitization syndromes such as fibromyalgia.  Myotomal referral pain from trigger points is also not excluded.   Recommendations: 1.  Follow-up with referring physician. 2.  Continue current management of symptoms. 3.  Continue use of resting splint at night-time and as needed during the day.  Consider diagnostic median nerve block/carpal tunnel injection  Meds & Orders: No orders of the defined types were placed in this encounter.   Orders Placed This Encounter  Procedures  . NCV with EMG (electromyography)    Follow-up: Return for Eunice Blase, MD.   Procedures: No procedures performed  EMG & NCV Findings: Evaluation of the right median (across palm) sensory nerve showed prolonged distal peak latency (Wrist, 4.0 ms).  All remaining nerves (as indicated in the following tables) were within normal limits.    All examined muscles (as indicated in the following table) showed no evidence of electrical instability.    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  This likely would not explain the totality of her symptoms.  There is no significant electrodiagnostic evidence of any other focal nerve entrapment, brachial plexopathy or cervical radiculopathy.    As you know, this particular electrodiagnostic study cannot rule out chemical radiculitis or sensory only radiculopathy. **This electrodiagnostic study cannot rule out small fiber polyneuropathy and dysesthesias from central pain syndromes such as stroke or central pain sensitization syndromes such as fibromyalgia.  Myotomal referral pain from trigger points is also not excluded.  Recommendations: 1.  Follow-up with referring physician. 2.  Continue current management of symptoms. 3.  Continue use of resting splint at night-time  and as needed during the day.  Consider diagnostic median nerve block/carpal tunnel injection  ___________________________ Laurence Spates [facility] Board Certified,  American Board of Physical Medicine and Rehabilitation    Nerve Conduction Studies Anti Sensory Summary Table   Stim Site NR Peak (ms) Norm Peak (ms) P-T Amp (V) Norm P-T Amp Site1 Site2 Delta-P (ms) Dist (cm) Vel (m/s) Norm Vel (m/s)  Right Median Acr Palm Anti Sensory (2nd Digit)  31.1C  Wrist    *4.0 <3.6 33.7 >10 Wrist Palm 2.0 0.0    Palm    2.0 <2.0 15.2         Right Radial Anti Sensory (Base 1st Digit)  30.9C  Wrist    2.3 <3.1 25.2  Wrist Base 1st Digit 2.3 0.0    Right Ulnar Anti Sensory (5th Digit)  31C  Wrist    3.1 <3.7 31.1 >15.0 Wrist 5th Digit 3.1 14.0 45 >38   Motor Summary Table   Stim Site NR Onset (ms) Norm Onset (ms) O-P Amp (mV) Norm O-P Amp Site1 Site2 Delta-0 (ms) Dist (cm) Vel (m/s) Norm Vel (m/s)  Right Median Motor (Abd Poll Brev)  30.9C  Wrist    4.1 <4.2 7.3 >5 Elbow Wrist 4.1 20.4 50 >50  Elbow    8.2  6.9         Right Ulnar Motor (Abd Dig Min)  31.1C  Wrist    2.6 <4.2 9.6 >3 B Elbow Wrist 3.3 19.0 58 >53  B Elbow    5.9  9.7  A Elbow B Elbow 1.1 10.0 91 >53  A Elbow    7.0  9.9          EMG   Side Muscle Nerve Root Ins Act Fibs Psw Amp Dur Poly Recrt Int Fraser Din Comment  Right Abd Poll Brev Median C8-T1 Nml Nml Nml Nml Nml 0 Nml Nml   Right 1stDorInt Ulnar C8-T1 Nml Nml Nml Nml Nml 0 Nml Nml   Right PronatorTeres Median C6-7 Nml Nml Nml Nml Nml 0 Nml Nml   Right Biceps Musculocut C5-6 Nml Nml Nml Nml Nml 0 Nml Nml   Right Deltoid Axillary C5-6 Nml Nml Nml Nml Nml 0 Nml Nml     Nerve Conduction Studies Anti Sensory Left/Right Comparison   Stim Site L Lat (ms) R Lat (ms) L-R Lat (ms) L Amp (V) R Amp (V) L-R Amp (%) Site1 Site2 L Vel (m/s) R Vel (m/s) L-R Vel (m/s)  Median Acr Palm Anti Sensory (2nd Digit)  31.1C  Wrist  *4.0   33.7  Wrist Palm     Palm  2.0   15.2        Radial Anti Sensory (Base 1st Digit)  30.9C  Wrist  2.3   25.2  Wrist Base 1st Digit     Ulnar Anti Sensory (5th Digit)  31C  Wrist  3.1   31.1  Wrist 5th Digit  45     Motor Left/Right Comparison   Stim Site L Lat (ms) R Lat (ms) L-R Lat (ms) L Amp (mV) R Amp (mV) L-R Amp (%) Site1 Site2 L Vel (m/s) R Vel (m/s) L-R Vel (m/s)  Median Motor (Abd Poll Brev)  30.9C  Wrist  4.1   7.3  Elbow Wrist  50   Elbow  8.2   6.9        Ulnar Motor (Abd Dig Min)  31.1C  Wrist  2.6   9.6  B Elbow Wrist  58  B Elbow  5.9   9.7  A Elbow B Elbow  91   A Elbow  7.0   9.9           Waveforms:            Clinical History: No specialty comments available.   She reports that she has been smoking. She has never used smokeless tobacco. No results for input(s): HGBA1C, LABURIC in the last 8760 hours.  Objective:  VS:  HT:    WT:   BMI:     BP:   HR: bpm  TEMP: ( )  RESP:  Physical Exam Musculoskeletal:        General: No swelling, tenderness or deformity.     Comments: Inspection reveals no atrophy of the bilateral APB or FDI or hand intrinsics. There is no swelling, color changes, allodynia or dystrophic changes. There is 5 out of 5 strength in the bilateral wrist extension, finger abduction and long finger flexion. There is intact sensation to light touch in all dermatomal and peripheral nerve distributions.  There is a negative Hoffmann's test bilaterally.  Skin:    General: Skin is warm and dry.     Findings: No erythema or rash.  Neurological:     General: No focal deficit present.     Mental Status: She is alert and oriented to person, place, and time.     Motor: No weakness or abnormal muscle tone.     Coordination: Coordination normal.  Psychiatric:        Mood and Affect: Mood normal.        Behavior: Behavior normal.     Ortho Exam Imaging: No results found.  Past Medical/Family/Surgical/Social History: Medications & Allergies reviewed per EMR, new medications updated. Patient Active Problem List   Diagnosis Date Noted  . Thoracic outlet syndrome 10/01/2018  . Myofascial pain 09/04/2017  . Bilateral carpal tunnel syndrome 09/04/2017   . Ankle impingement syndrome, left 09/04/2017  . Migraine with aura and without status migrainosus, not intractable 09/06/2015  . Anxiety and depression 11/05/2009  . Attention deficit disorder 11/05/2009   Past Medical History:  Diagnosis Date  . ADHD (attention deficit hyperactivity disorder)   . Anxiety   . Depression   . Fibromyalgia   . Gallstones   . Gestational diabetes   . History of chicken pox   . History of condyloma acuminatum   . Migraines    Family History  Problem Relation Age of Onset  . Hypertension Mother   . Migraines Mother   . Hypertension Father   . Migraines Sister   . Heart disease Maternal Grandmother   . Heart disease Maternal Grandfather   . Diabetes Paternal Grandmother   . Prostate cancer Paternal Grandfather   . Esophageal cancer Paternal Grandfather   . Healthy Son   . Healthy Daughter   . Anesthesia problems Neg Hx   . Hypotension Neg Hx   . Malignant hyperthermia Neg Hx   . Pseudochol deficiency Neg Hx   . Colon cancer Neg Hx    Past Surgical History:  Procedure Laterality Date  . CESAREAN SECTION  06/25/2011  . CESAREAN SECTION N/A 05/04/2015  . CHOLECYSTECTOMY N/A 07/07/2015  . ERCP N/A 08/24/2015  . KNEE CARTILAGE SURGERY Left    Social History   Occupational History  . Occupation: Doesn't work  Tobacco Use  . Smoking status: Current Some Day Smoker  . Smokeless tobacco: Never Used  . Tobacco comment: smokes socially (1 cigarette  a week)  Substance and Sexual Activity  . Alcohol use: No    Alcohol/week: 0.0 standard drinks  . Drug use: No  . Sexual activity: Yes

## 2019-06-19 NOTE — Procedures (Signed)
EMG & NCV Findings: Evaluation of the right median (across palm) sensory nerve showed prolonged distal peak latency (Wrist, 4.0 ms).  All remaining nerves (as indicated in the following tables) were within normal limits.    All examined muscles (as indicated in the following table) showed no evidence of electrical instability.    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  This likely would not explain the totality of her symptoms.  There is no significant electrodiagnostic evidence of any other focal nerve entrapment, brachial plexopathy or cervical radiculopathy.    As you know, this particular electrodiagnostic study cannot rule out chemical radiculitis or sensory only radiculopathy. **This electrodiagnostic study cannot rule out small fiber polyneuropathy and dysesthesias from central pain syndromes such as stroke or central pain sensitization syndromes such as fibromyalgia.  Myotomal referral pain from trigger points is also not excluded.  Recommendations: 1.  Follow-up with referring physician. 2.  Continue current management of symptoms. 3.  Continue use of resting splint at night-time and as needed during the day.  Consider diagnostic median nerve block/carpal tunnel injection  ___________________________ Laurence Spates [facility] Board Certified, American Board of Physical Medicine and Rehabilitation    Nerve Conduction Studies Anti Sensory Summary Table   Stim Site NR Peak (ms) Norm Peak (ms) P-T Amp (V) Norm P-T Amp Site1 Site2 Delta-P (ms) Dist (cm) Vel (m/s) Norm Vel (m/s)  Right Median Acr Palm Anti Sensory (2nd Digit)  31.1C  Wrist    *4.0 <3.6 33.7 >10 Wrist Palm 2.0 0.0    Palm    2.0 <2.0 15.2         Right Radial Anti Sensory (Base 1st Digit)  30.9C  Wrist    2.3 <3.1 25.2  Wrist Base 1st Digit 2.3 0.0    Right Ulnar  Anti Sensory (5th Digit)  31C  Wrist    3.1 <3.7 31.1 >15.0 Wrist 5th Digit 3.1 14.0 45 >38   Motor Summary Table   Stim Site NR Onset (ms) Norm Onset (ms) O-P Amp (mV) Norm O-P Amp Site1 Site2 Delta-0 (ms) Dist (cm) Vel (m/s) Norm Vel (m/s)  Right Median Motor (Abd Poll Brev)  30.9C  Wrist    4.1 <4.2 7.3 >5 Elbow Wrist 4.1 20.4 50 >50  Elbow    8.2  6.9         Right Ulnar Motor (Abd Dig Min)  31.1C  Wrist    2.6 <4.2 9.6 >3 B Elbow Wrist 3.3 19.0 58 >53  B Elbow    5.9  9.7  A Elbow B Elbow 1.1 10.0 91 >53  A Elbow    7.0  9.9          EMG   Side Muscle Nerve Root Ins Act Fibs Psw Amp Dur Poly Recrt Int Fraser Din Comment  Right Abd Poll Brev Median C8-T1 Nml Nml Nml Nml Nml 0 Nml Nml   Right 1stDorInt Ulnar C8-T1 Nml Nml Nml Nml Nml 0 Nml Nml   Right PronatorTeres Median C6-7 Nml Nml Nml Nml Nml 0 Nml Nml   Right Biceps Musculocut C5-6 Nml Nml Nml Nml Nml 0 Nml Nml   Right Deltoid Axillary C5-6 Nml Nml Nml Nml Nml 0 Nml Nml     Nerve Conduction Studies Anti Sensory Left/Right Comparison   Stim Site L Lat (ms) R Lat (ms) L-R Lat (ms) L Amp (V)  R Amp (V) L-R Amp (%) Site1 Site2 L Vel (m/s) R Vel (m/s) L-R Vel (m/s)  Median Acr Palm Anti Sensory (2nd Digit)  31.1C  Wrist  *4.0   33.7  Wrist Palm     Palm  2.0   15.2        Radial Anti Sensory (Base 1st Digit)  30.9C  Wrist  2.3   25.2  Wrist Base 1st Digit     Ulnar Anti Sensory (5th Digit)  31C  Wrist  3.1   31.1  Wrist 5th Digit  45    Motor Left/Right Comparison   Stim Site L Lat (ms) R Lat (ms) L-R Lat (ms) L Amp (mV) R Amp (mV) L-R Amp (%) Site1 Site2 L Vel (m/s) R Vel (m/s) L-R Vel (m/s)  Median Motor (Abd Poll Brev)  30.9C  Wrist  4.1   7.3  Elbow Wrist  50   Elbow  8.2   6.9        Ulnar Motor (Abd Dig Min)  31.1C  Wrist  2.6   9.6  B Elbow Wrist  58   B Elbow  5.9   9.7  A Elbow B Elbow  91   A Elbow  7.0   9.9           Waveforms:

## 2019-06-20 ENCOUNTER — Other Ambulatory Visit: Payer: Self-pay | Admitting: Family Medicine

## 2019-06-23 ENCOUNTER — Encounter: Payer: Self-pay | Admitting: Family Medicine

## 2019-06-23 ENCOUNTER — Other Ambulatory Visit: Payer: Self-pay | Admitting: Family Medicine

## 2019-06-23 DIAGNOSIS — M79642 Pain in left hand: Secondary | ICD-10-CM

## 2019-06-23 DIAGNOSIS — Z7184 Encounter for health counseling related to travel: Secondary | ICD-10-CM

## 2019-06-23 DIAGNOSIS — M542 Cervicalgia: Secondary | ICD-10-CM

## 2019-06-23 DIAGNOSIS — M79641 Pain in right hand: Secondary | ICD-10-CM

## 2019-06-24 ENCOUNTER — Encounter: Payer: Self-pay | Admitting: Physician Assistant

## 2019-06-24 ENCOUNTER — Ambulatory Visit: Payer: No Typology Code available for payment source | Admitting: Family Medicine

## 2019-06-24 DIAGNOSIS — M542 Cervicalgia: Secondary | ICD-10-CM

## 2019-06-24 NOTE — Telephone Encounter (Signed)
Ok to refill? Think that there is a my chart message about this as well.

## 2019-06-25 MED ORDER — ONDANSETRON HCL 4 MG PO TABS: 4.0000 mg | ORAL_TABLET | Freq: Three times a day (TID) | ORAL | 0 refills | Status: DC | PRN

## 2019-06-25 MED ORDER — CYCLOBENZAPRINE HCL 5 MG PO TABS: 5.0000 mg | ORAL_TABLET | Freq: Every day | ORAL | 0 refills | Status: DC | PRN

## 2019-06-25 NOTE — Telephone Encounter (Signed)
[name] [name], pt requesting refill for Flexeril, okay to refill? Dr. [name] filled it last.

## 2019-06-26 ENCOUNTER — Other Ambulatory Visit: Payer: Self-pay

## 2019-06-26 ENCOUNTER — Encounter: Payer: Self-pay | Admitting: Family Medicine

## 2019-06-26 ENCOUNTER — Ambulatory Visit (INDEPENDENT_AMBULATORY_CARE_PROVIDER_SITE_OTHER): Payer: No Typology Code available for payment source | Admitting: Family Medicine

## 2019-06-26 DIAGNOSIS — M25511 Pain in right shoulder: Secondary | ICD-10-CM

## 2019-06-26 DIAGNOSIS — G8929 Other chronic pain: Secondary | ICD-10-CM

## 2019-06-26 DIAGNOSIS — M79641 Pain in right hand: Secondary | ICD-10-CM | POA: Diagnosis not present

## 2019-06-26 DIAGNOSIS — M79642 Pain in left hand: Secondary | ICD-10-CM | POA: Diagnosis not present

## 2019-06-26 MED ORDER — COLCHICINE 0.6 MG PO CAPS: 1.0000 | ORAL_CAPSULE | Freq: Two times a day (BID) | ORAL | 3 refills | Status: DC | PRN

## 2019-06-26 NOTE — Progress Notes (Signed)
Office Visit Note   Patient: [name] [name]           Date of Birth: 09/24/1980           MRN: [id number] Visit Date: 06/26/2019 Requested by: Briscoe Deutscher, [place],   [id number] PCP: Inda Coke, [place]  Subjective: Chief Complaint  Patient presents with  . bil hand swelling/numbness - dropping objects    HPI: She is here for follow-up chronic right shoulder pain, and bilateral hand pain and numbness.  Nerve studies showed mild to moderate carpal tunnel syndrome on the right.  She has numbness in both hands when she wakes up in the morning.  In the past few weeks, the fingers of her hands have been intermittently swollen and painful.  She visibly sees swelling but no redness.  Her father has a history of gout.  Patient has never been diagnosed with this.  X-rays of her right shoulder last month showed chondrocalcinosis in the [facility] joint and the glenohumeral joint.               ROS: No fever or chills.  All other systems were reviewed and are negative.  Objective: Vital Signs: There were no vitals taken for this visit.  Physical Exam:  General:  Alert and oriented, in no acute distress. Pulm:  Breathing unlabored. Psy:  Normal mood, congruent affect. Skin: No rash or erythema. Right shoulder: She does have some tenderness at the [facility] joint. Hands: No visible swelling of her joints this morning, no warmth or erythema.  She is uncomfortable making a fist but is able to flex her fingers with full range of motion.  She has positive Tinel's at the carpal tunnel on the right, negative on the left.  Bilateral positive Phalen's test.  Imaging: None today.  Assessment & Plan: 1.  Bilateral hand pain and chronic right shoulder pain, suspicious for pseudogout.  Cannot rule out other rheumatologic disease. -We will draw some labs today.  We will try colchicine.  She will use carpal tunnel night splints. -Consider rheumatology referral depending on lab results.      Procedures: No procedures performed  No notes on file     PMFS History: Patient Active Problem List   Diagnosis Date Noted  . Thoracic outlet syndrome 10/01/2018  . Myofascial pain 09/04/2017  . Bilateral carpal tunnel syndrome 09/04/2017  . Ankle impingement syndrome, left 09/04/2017  . Migraine with aura and without status migrainosus, not intractable 09/06/2015  . Anxiety and depression 11/05/2009  . Attention deficit disorder 11/05/2009   Past Medical History:  Diagnosis Date  . ADHD (attention deficit hyperactivity disorder)   . Anxiety   . Depression   . Fibromyalgia   . Gallstones   . Gestational diabetes   . History of chicken pox   . History of condyloma acuminatum   . Migraines     Family History  Problem Relation Age of Onset  . Hypertension Mother   . Migraines Mother   . Hypertension Father   . Migraines Sister   . Heart disease Maternal Grandmother   . Heart disease Maternal Grandfather   . Diabetes Paternal Grandmother   . Prostate cancer Paternal Grandfather   . Esophageal cancer Paternal Grandfather   . Healthy Son   . Healthy Daughter   . Anesthesia problems Neg Hx   . Hypotension Neg Hx   . Malignant hyperthermia Neg Hx   . Pseudochol deficiency Neg Hx   .  Colon cancer Neg Hx     Past Surgical History:  Procedure Laterality Date  . CESAREAN SECTION  06/25/2011  . CESAREAN SECTION N/A 05/04/2015  . CHOLECYSTECTOMY N/A 07/07/2015  . ERCP N/A 08/24/2015  . KNEE CARTILAGE SURGERY Left    Social History   Occupational History  . Occupation: Doesn't work  Tobacco Use  . Smoking status: Current Some Day Smoker  . Smokeless tobacco: Never Used  . Tobacco comment: smokes socially (1 cigarette a week)  Substance and Sexual Activity  . Alcohol use: No    Alcohol/week: 0.0 standard drinks  . Drug use: No  . Sexual activity: Yes

## 2019-06-27 ENCOUNTER — Other Ambulatory Visit: Payer: Self-pay | Admitting: Family Medicine

## 2019-06-27 ENCOUNTER — Telehealth: Payer: Self-pay | Admitting: Family Medicine

## 2019-06-27 MED ORDER — TRAMADOL HCL 50 MG PO TABS: 50.0000 mg | ORAL_TABLET | Freq: Every evening | ORAL | 0 refills | Status: DC | PRN

## 2019-06-27 NOTE — Telephone Encounter (Signed)
Please advise 

## 2019-06-27 NOTE — Telephone Encounter (Signed)
Two results still pending.  Others are normal so far.

## 2019-06-30 ENCOUNTER — Telehealth: Payer: Self-pay | Admitting: Family Medicine

## 2019-06-30 LAB — ANTI-NUCLEAR AB-TITER (ANA TITER): ANA Titer 1: 1:80 {titer} — ABNORMAL HIGH

## 2019-06-30 LAB — URIC ACID: Uric Acid, Serum: 3.4 mg/dL (ref 2.5–7.0)

## 2019-06-30 LAB — RHEUMATOID FACTOR: Rheumatoid fact SerPl-aCnc: <14 IU/mL (ref <14)

## 2019-06-30 LAB — SEDIMENTATION RATE: Sed Rate: 9 mm/h (ref 0–20)

## 2019-06-30 LAB — C-REACTIVE PROTEIN: CRP: 3.7 mg/L (ref <8.0)

## 2019-06-30 LAB — ANA: Anti Nuclear Antibody (ANA): POSITIVE — AB

## 2019-06-30 LAB — CYCLIC CITRUL PEPTIDE ANTIBODY, IGG: Cyclic Citrullin Peptide Ab: <16 UNITS

## 2019-06-30 MED ORDER — PREDNISONE 10 MG PO TABS: ORAL_TABLET | ORAL | 0 refills | Status: DC

## 2019-06-30 NOTE — Telephone Encounter (Signed)
ANA is moderately positive, indicating autoimmune disease of some sort.

## 2019-06-30 NOTE — Addendum Note (Signed)
Addended by: Hortencia Pilar on: 06/30/2019 09:40 AM   Modules accepted: Orders

## 2019-07-08 ENCOUNTER — Other Ambulatory Visit: Payer: Self-pay | Admitting: Family Medicine

## 2019-07-08 MED ORDER — TRAMADOL HCL 50 MG PO TABS: 50.0000 mg | ORAL_TABLET | Freq: Every evening | ORAL | 0 refills | Status: DC | PRN

## 2019-07-08 NOTE — Telephone Encounter (Signed)
[facility] patient.  

## 2019-07-08 NOTE — Telephone Encounter (Signed)
Please advise 

## 2019-07-17 ENCOUNTER — Other Ambulatory Visit: Payer: Self-pay | Admitting: Family Medicine

## 2019-07-17 NOTE — Telephone Encounter (Signed)
Hilts appt

## 2019-07-17 NOTE — Telephone Encounter (Signed)
Please advise 

## 2019-07-18 MED ORDER — TRAMADOL HCL 50 MG PO TABS: 50.0000 mg | ORAL_TABLET | Freq: Every evening | ORAL | 0 refills | Status: DC | PRN

## 2019-07-22 ENCOUNTER — Encounter: Payer: Self-pay | Admitting: Physician Assistant

## 2019-07-23 ENCOUNTER — Other Ambulatory Visit: Payer: Self-pay

## 2019-07-23 DIAGNOSIS — Z20822 Contact with and (suspected) exposure to covid-19: Secondary | ICD-10-CM

## 2019-07-24 LAB — NOVEL CORONAVIRUS, NAA: SARS-CoV-2, NAA: NOT DETECTED

## 2019-07-26 ENCOUNTER — Other Ambulatory Visit: Payer: Self-pay | Admitting: Physician Assistant

## 2019-07-26 DIAGNOSIS — Z7184 Encounter for health counseling related to travel: Secondary | ICD-10-CM

## 2019-07-28 ENCOUNTER — Other Ambulatory Visit: Payer: Self-pay | Admitting: Family Medicine

## 2019-07-28 MED ORDER — TRAMADOL HCL 50 MG PO TABS: 50.0000 mg | ORAL_TABLET | Freq: Every evening | ORAL | 0 refills | Status: DC | PRN

## 2019-07-28 MED ORDER — ONDANSETRON HCL 4 MG PO TABS: 4.0000 mg | ORAL_TABLET | Freq: Three times a day (TID) | ORAL | 0 refills | Status: DC | PRN

## 2019-08-03 ENCOUNTER — Encounter: Payer: Self-pay | Admitting: Family Medicine

## 2019-08-07 ENCOUNTER — Other Ambulatory Visit: Payer: Self-pay | Admitting: Family Medicine

## 2019-08-07 MED ORDER — TRAMADOL HCL 50 MG PO TABS: 50.0000 mg | ORAL_TABLET | Freq: Every evening | ORAL | 0 refills | Status: DC | PRN

## 2019-08-07 NOTE — Telephone Encounter (Signed)
[facility] patient.  

## 2019-08-11 NOTE — Progress Notes (Signed)
Virtual Visit via Video Note  I connected with [name] [name] on 08/12/19 at  8:45 AM EST by a video enabled telemedicine application and verified that I am speaking with the correct person using two identifiers.  Location: Patient: Home Provider: Clinic  This service was conducted via virtual visit.  Both audio and visual tools were used.  The patient was located at home. I was located in my office.  Consent was obtained prior to the virtual visit and is aware of possible charges through their insurance for this visit.  The patient is an established patient.  [name], MD conducted the virtual visit and [name], PA-C acted as scribe during the service.  Office staff helped with scheduling follow up visits after the service was conducted.     I discussed the limitations of evaluation and management by telemedicine and the availability of in person appointments. The patient expressed understanding and agreed to proceed.  CC: History of Present Illness: Patient is a 38 year old female with a past medical history of myofascial pain syndrome.  She has been seen in consultation per request of [name] for evaluation of pain in multiple joints.  According to patient she has been having pain in her muscles and joints for the last 2 years.  She was diagnosed with fibromyalgia syndrome and was a started on Cymbalta which helped her symptoms to some extent.  She states she has been having increased pain and discomfort in her shoulders for the last few months.  Over the last 1 year she has been having intermittent swelling in her hands.  She states she has been giving few courses of prednisone which were helpful.  She was recently referred to Dr. [name] who did x-ray of her shoulder joints and diagnosed her with chondrocalcinosis.  He also gave her a trial of colchicine.  She states she took it for couple of weeks and discontinued as it was not effective.  She complains of pain and discomfort in her  bilateral hands with intermittent swelling.  She states she is so swelling almost about once a week.  She states her hands are so swollen that she cannot make a fist.  She also reports discomfort in her bilateral feet intermittently.  She has had lower back pain for the last several years.  She states the pain does not radiate into her lower extremities.  She has never had evaluation for lower back pain.  She states she is allergic to NSAIDs.  None of the other joints are swollen.  There is positive family history of gout.  Review of Systems  Constitutional: Positive for malaise/fatigue. Negative for fever.  HENT:       +Dry mouth  Eyes: Negative for photophobia, pain, discharge and redness.  Respiratory: Negative for cough, shortness of breath and wheezing.   Cardiovascular: Negative for chest pain and palpitations.  Gastrointestinal: Negative for blood in stool, constipation and diarrhea.  Genitourinary: Negative for dysuria.  Musculoskeletal: Positive for back pain, joint pain and myalgias. Negative for neck pain.       +Morning stiffness  +Joint swelling   Skin: Negative for rash.  Neurological: Negative for dizziness and headaches.  Psychiatric/Behavioral: Negative for depression. The patient has insomnia. The patient is not nervous/anxious.       Observations/Objective: Physical Exam  Constitutional: She is oriented to person, place, and time and well-developed, well-nourished, and in no distress.  HENT:  Head: Normocephalic and atraumatic.  Eyes: Conjunctivae are normal.  Pulmonary/Chest: Effort normal.  Neurological: She is alert and oriented to person, place, and time.  Psychiatric: Mood, memory, affect and judgment normal.    Patient reports morning stiffness for   Several hours.   Patient reports nocturnal pain.  Difficulty dressing/grooming: Denies Difficulty climbing stairs: Denies Difficulty getting out of chair: Reports Difficulty using hands for taps, buttons,  cutlery, and/or writing: Reports   06/26/19: ANA 1:80 NH, CRP 3.7, RF<14, ESR 9, CCP<16, uric acid 3.4   Assessment and Plan: Diagnoses and all orders for this visit:  Bilateral hand pain- Patient gives history of increased pain and swelling in her hands for the last 1 year.  She responds to prednisone.  She is taking colchicine without adequate response over 2 weeks.  She also sent some pictures to my chart which shows swollen hands.  The differential diagnosis will be chondrocalcinosis versus inflammatory arthritis like rheumatoid arthritis or autoimmune disease.  I will obtain additional labs today.  Her ANA was positive at low titer.  I have also advised her to stay on colchicine 1 tablet a day and if she has inflammation to take it twice daily as a trial.  We will see her back in the office after the lab work and will obtain x-rays and evaluate for swelling. Comments: 06/26/19: ANA 1:80 NH, CRP 3.7, RF<14, ESR 9, CCP<16, uric acid 3.4  Carpal tunnel syndrome of right wrist Comments: NCV with EMG on 06/18/19: Mild to moderate right median nerve entrapment.  Using night splint  Chondrocalcinosis Comments: Right glenohumeral joint and AC joint-trial of colchicine prescribed by [name] Orders: -     Iron, TIBC and Ferritin Panel; Future  Positive ANA (antinuclear antibody)- She has no other clinical features of lupus.  She has history of inflammatory arthritis.  She has positive ANA.  I will obtain additional labs. -     Scl-70; Future -     RNP Antibody; Future -     Anti-Smith antibody; Future -     Ro; Future -     Ds DNA; Future -     La; Future -     C3 and C4; Future  Chronic midline low back pain without sciatica -     HLA-B27; Future  High risk medication use -     G6PD; Future -     Hepatitis B core antibody, IgM; Future -     Hepatitis B surface antigen; Future -     Hepatitis C antibody; Future -     HIV antibody; Future -     QuantiFERON-TB Gold Plus;  Future -     Serum protein electrophoresis with reflex; Future -     Immunoglobulins; Future  Other fatigue -     CBC with diff; Future -     CMP with GFR; Future  Myofascial pain- Patient states that she was diagnosed with the myofascial pain syndrome over 2 years ago.  She had good response to Cymbalta.  Other medical problems are listed as follows:  Ankle impingement syndrome, left  Thoracic outlet syndrome  Migraine with aura and without status migrainosus, not intractable  Anxiety and depression  Attention deficit disorder, unspecified hyperactivity presence     Follow Up Instructions: She will follow up in the next few weeks for follow-up visit.   I discussed the assessment and treatment plan with the patient. The patient was provided an opportunity to ask questions and all were answered. The patient agreed with the plan and demonstrated  an understanding of the instructions.   The patient was advised to call back or seek an in-person evaluation if the symptoms worsen or if the condition fails to improve as anticipated.  I provided 45 minutes of non-face-to-face time during this encounter.   Bo Merino, MD

## 2019-08-12 ENCOUNTER — Telehealth (INDEPENDENT_AMBULATORY_CARE_PROVIDER_SITE_OTHER): Payer: No Typology Code available for payment source | Admitting: Rheumatology

## 2019-08-12 ENCOUNTER — Other Ambulatory Visit: Payer: Self-pay

## 2019-08-12 ENCOUNTER — Encounter: Payer: Self-pay | Admitting: Rheumatology

## 2019-08-12 DIAGNOSIS — G8929 Other chronic pain: Secondary | ICD-10-CM

## 2019-08-12 DIAGNOSIS — R768 Other specified abnormal immunological findings in serum: Secondary | ICD-10-CM | POA: Diagnosis not present

## 2019-08-12 DIAGNOSIS — G5601 Carpal tunnel syndrome, right upper limb: Secondary | ICD-10-CM | POA: Diagnosis not present

## 2019-08-12 DIAGNOSIS — R5383 Other fatigue: Secondary | ICD-10-CM

## 2019-08-12 DIAGNOSIS — F419 Anxiety disorder, unspecified: Secondary | ICD-10-CM

## 2019-08-12 DIAGNOSIS — M545 Low back pain, unspecified: Secondary | ICD-10-CM

## 2019-08-12 DIAGNOSIS — Z79899 Other long term (current) drug therapy: Secondary | ICD-10-CM

## 2019-08-12 DIAGNOSIS — G5603 Carpal tunnel syndrome, bilateral upper limbs: Secondary | ICD-10-CM

## 2019-08-12 DIAGNOSIS — F988 Other specified behavioral and emotional disorders with onset usually occurring in childhood and adolescence: Secondary | ICD-10-CM

## 2019-08-12 DIAGNOSIS — M1120 Other chondrocalcinosis, unspecified site: Secondary | ICD-10-CM

## 2019-08-12 DIAGNOSIS — G43109 Migraine with aura, not intractable, without status migrainosus: Secondary | ICD-10-CM

## 2019-08-12 DIAGNOSIS — M25872 Other specified joint disorders, left ankle and foot: Secondary | ICD-10-CM

## 2019-08-12 DIAGNOSIS — M79642 Pain in left hand: Secondary | ICD-10-CM

## 2019-08-12 DIAGNOSIS — F32A Depression, unspecified: Secondary | ICD-10-CM

## 2019-08-12 DIAGNOSIS — M79641 Pain in right hand: Secondary | ICD-10-CM | POA: Diagnosis not present

## 2019-08-12 DIAGNOSIS — M7918 Myalgia, other site: Secondary | ICD-10-CM

## 2019-08-12 DIAGNOSIS — F329 Major depressive disorder, single episode, unspecified: Secondary | ICD-10-CM

## 2019-08-12 DIAGNOSIS — G540 Brachial plexus disorders: Secondary | ICD-10-CM

## 2019-08-17 ENCOUNTER — Other Ambulatory Visit: Payer: Self-pay | Admitting: Physician Assistant

## 2019-08-17 ENCOUNTER — Other Ambulatory Visit: Payer: Self-pay | Admitting: Family Medicine

## 2019-08-17 DIAGNOSIS — Z7184 Encounter for health counseling related to travel: Secondary | ICD-10-CM

## 2019-08-17 DIAGNOSIS — M542 Cervicalgia: Secondary | ICD-10-CM

## 2019-08-18 MED ORDER — ONDANSETRON HCL 4 MG PO TABS: 4.0000 mg | ORAL_TABLET | Freq: Three times a day (TID) | ORAL | 0 refills | Status: DC | PRN

## 2019-08-18 MED ORDER — TRAMADOL HCL 50 MG PO TABS: 50.0000 mg | ORAL_TABLET | Freq: Every evening | ORAL | 0 refills | Status: DC | PRN

## 2019-08-18 MED ORDER — CYCLOBENZAPRINE HCL 5 MG PO TABS: 5.0000 mg | ORAL_TABLET | Freq: Every day | ORAL | 0 refills | Status: DC | PRN

## 2019-08-18 NOTE — Telephone Encounter (Signed)
[facility] patient.  

## 2019-08-18 NOTE — Telephone Encounter (Signed)
LAST APPOINTMENT DATE: @8 /26/2020  NEXT APPOINTMENT DATE:n/a    LAST REFILL: 06/25/19  QTY: [id number]

## 2019-08-19 ENCOUNTER — Encounter: Payer: Self-pay | Admitting: Rheumatology

## 2019-08-19 ENCOUNTER — Encounter: Payer: Self-pay | Admitting: Family Medicine

## 2019-08-19 DIAGNOSIS — G8929 Other chronic pain: Secondary | ICD-10-CM

## 2019-08-19 DIAGNOSIS — M25511 Pain in right shoulder: Secondary | ICD-10-CM

## 2019-08-20 ENCOUNTER — Other Ambulatory Visit: Payer: Self-pay | Admitting: *Deleted

## 2019-08-20 DIAGNOSIS — G8929 Other chronic pain: Secondary | ICD-10-CM

## 2019-08-20 DIAGNOSIS — R5383 Other fatigue: Secondary | ICD-10-CM

## 2019-08-20 DIAGNOSIS — M1120 Other chondrocalcinosis, unspecified site: Secondary | ICD-10-CM

## 2019-08-20 DIAGNOSIS — R768 Other specified abnormal immunological findings in serum: Secondary | ICD-10-CM

## 2019-08-20 DIAGNOSIS — Z79899 Other long term (current) drug therapy: Secondary | ICD-10-CM

## 2019-08-20 NOTE — Telephone Encounter (Signed)
Spoke with patient and she denies shortness of breath and lip swelling. patient states the only side effect she is currently having is the hives. Patient is taking benadryl. Advised patient to go to the ED if she develops any new or worsening side effects, patient verbalized understanding. [facility]. Patient declined prednisone taper at this time.

## 2019-08-25 LAB — CBC WITH DIFFERENTIAL/PLATELET
Absolute Monocytes: 378 cells/uL (ref 200–950)
Basophils Absolute: 18 cells/uL (ref 0–200)
Basophils Relative: 0.3 %
Eosinophils Absolute: 330 cells/uL (ref 15–500)
Eosinophils Relative: 5.5 %
HCT: 40.4 % (ref 35.0–45.0)
Hemoglobin: 13.7 g/dL (ref 11.7–15.5)
Lymphs Abs: 1602 cells/uL (ref 850–3900)
MCH: 29.8 pg (ref 27.0–33.0)
MCHC: 33.9 g/dL (ref 32.0–36.0)
MCV: 88 fL (ref 80.0–100.0)
MPV: 10.3 fL (ref 7.5–12.5)
Monocytes Relative: 6.3 %
Neutro Abs: 3672 cells/uL (ref 1500–7800)
Neutrophils Relative %: 61.2 %
Platelets: 265 10*3/uL (ref 140–400)
RBC: 4.59 10*6/uL (ref 3.80–5.10)
RDW: 13.1 % (ref 11.0–15.0)
Total Lymphocyte: 26.7 %
WBC: 6 10*3/uL (ref 3.8–10.8)

## 2019-08-25 LAB — RNP ANTIBODY: Ribonucleic Protein(ENA) Antibody, IgG: <1 AI

## 2019-08-25 LAB — COMPLETE METABOLIC PANEL WITH GFR
AG Ratio: 2.1 (calc) (ref 1.0–2.5)
ALT: 24 U/L (ref 6–29)
AST: 18 U/L (ref 10–30)
Albumin: 4.2 g/dL (ref 3.6–5.1)
Alkaline phosphatase (APISO): 47 U/L (ref 31–125)
BUN: 13 mg/dL (ref 7–25)
CO2: 26 mmol/L (ref 20–32)
Calcium: 8.4 mg/dL — ABNORMAL LOW (ref 8.6–10.2)
Chloride: 105 mmol/L (ref 98–110)
Creat: 0.59 mg/dL (ref 0.50–1.10)
GFR, Est African American: 135 mL/min/{1.73_m2} (ref >=60)
GFR, Est Non African American: 116 mL/min/{1.73_m2} (ref >=60)
Globulin: 2 g/dL (calc) (ref 1.9–3.7)
Glucose, Bld: 74 mg/dL (ref 65–99)
Potassium: 4 mmol/L (ref 3.5–5.3)
Sodium: 140 mmol/L (ref 135–146)
Total Bilirubin: 0.4 mg/dL (ref 0.2–1.2)
Total Protein: 6.2 g/dL (ref 6.1–8.1)

## 2019-08-25 LAB — PROTEIN ELECTROPHORESIS, SERUM, WITH REFLEX
Albumin ELP: 4.1 g/dL (ref 3.8–4.8)
Alpha 1: 0.3 g/dL (ref 0.2–0.3)
Alpha 2: 0.7 g/dL (ref 0.5–0.9)
Beta 2: 0.3 g/dL (ref 0.2–0.5)
Beta Globulin: 0.4 g/dL (ref 0.4–0.6)
Gamma Globulin: 0.8 g/dL (ref 0.8–1.7)
Total Protein: 6.5 g/dL (ref 6.1–8.1)

## 2019-08-25 LAB — HEPATITIS C ANTIBODY
Hepatitis C Ab: NONREACTIVE
SIGNAL TO CUT-OFF: 0 (ref <1.00)

## 2019-08-25 LAB — QUANTIFERON-TB GOLD PLUS
Mitogen-NIL: 9.07 IU/mL
NIL: 0.06 IU/mL
QuantiFERON-TB Gold Plus: NEGATIVE
TB1-NIL: <0 IU/mL
TB2-NIL: <0 IU/mL

## 2019-08-25 LAB — ANTI-SMITH ANTIBODY: ENA SM Ab Ser-aCnc: <1 AI

## 2019-08-25 LAB — IRON,TIBC AND FERRITIN PANEL
%SAT: 25 % (calc) (ref 16–45)
Ferritin: 12 ng/mL — ABNORMAL LOW (ref 16–154)
Iron: 82 ug/dL (ref 40–190)
TIBC: 324 mcg/dL (calc) (ref 250–450)

## 2019-08-25 LAB — C3 AND C4
C3 Complement: 117 mg/dL (ref 83–193)
C4 Complement: 31 mg/dL (ref 15–57)

## 2019-08-25 LAB — ANTI-SCLERODERMA ANTIBODY: Scleroderma (Scl-70) (ENA) Antibody, IgG: <1 AI

## 2019-08-25 LAB — ANTI-DNA ANTIBODY, DOUBLE-STRANDED: ds DNA Ab: 1 IU/mL

## 2019-08-25 LAB — HEPATITIS B CORE ANTIBODY, IGM: Hep B C IgM: NONREACTIVE

## 2019-08-25 LAB — IGG, IGA, IGM
IgG (Immunoglobin G), Serum: 846 mg/dL (ref 600–1640)
IgM, Serum: 108 mg/dL (ref 50–300)
Immunoglobulin A: 125 mg/dL (ref 47–310)

## 2019-08-25 LAB — HEPATITIS B SURFACE ANTIGEN: Hepatitis B Surface Ag: NONREACTIVE

## 2019-08-25 LAB — GLUCOSE 6 PHOSPHATE DEHYDROGENASE: G-6PDH: 12.6 U/g Hgb (ref 7.0–20.5)

## 2019-08-25 LAB — HLA-B27 ANTIGEN: HLA-B27 Antigen: NEGATIVE

## 2019-08-25 LAB — SJOGRENS SYNDROME-B EXTRACTABLE NUCLEAR ANTIBODY: SSB (La) (ENA) Antibody, IgG: <1 AI

## 2019-08-25 LAB — SJOGRENS SYNDROME-A EXTRACTABLE NUCLEAR ANTIBODY: SSA (Ro) (ENA) Antibody, IgG: <1 AI

## 2019-08-25 LAB — HIV ANTIBODY (ROUTINE TESTING W REFLEX): HIV 1&2 Ab, 4th Generation: NONREACTIVE

## 2019-08-25 NOTE — Progress Notes (Signed)
All the labs are within normal limits except for low ferritin and low calcium.  Please advise patient to take calcium supplement with total intake of calcium between dietary and supplement approximately 1200 mg a day.  Please forward labs to her PCP.  I will discuss results at the follow-up visit.

## 2019-08-26 ENCOUNTER — Other Ambulatory Visit: Payer: Self-pay | Admitting: Family Medicine

## 2019-08-27 MED ORDER — TRAMADOL HCL 50 MG PO TABS: 50.0000 mg | ORAL_TABLET | Freq: Every evening | ORAL | 0 refills | Status: DC | PRN

## 2019-08-27 NOTE — Telephone Encounter (Signed)
Can you advise if ok to rf? Dr Marlou Sa out of office this week.

## 2019-09-05 ENCOUNTER — Other Ambulatory Visit: Payer: Self-pay

## 2019-09-08 ENCOUNTER — Other Ambulatory Visit: Payer: Self-pay

## 2019-09-08 NOTE — Progress Notes (Signed)
Office Visit Note  Patient: [name] [name]             Date of Birth: 11-22-1980           MRN: [id number]             PCP: Inda Coke, PA Referring: Inda Coke, PA Visit Date: 09/09/2019 Occupation: @GUAROCC @  Subjective:  Pain in shoulders and hands.  History of Present Illness: MARYBELLE GIRALDO is a 39 y.o. female with history of joint pain and muscle pain.  She was found to have some chondrocalcinosis on the ultrasound done by [name]  in her right shoulder.  She states that she tried colchicine but it caused hives and she discontinued the medication.  She states at home although she helps her grandparents friends which is hard on her joints.  She also has a 68-year-old child who does not require much lifting now.  She has ongoing discomfort in her hands and lower back.  Activities of Daily Living:  Patient reports morning stiffness for several  hours.   Patient Reports nocturnal pain.  Difficulty dressing/grooming: Reports Difficulty climbing stairs: Denies Difficulty getting out of chair: Denies Difficulty using hands for taps, buttons, cutlery, and/or writing: Reports  Review of Systems  Constitutional: Positive for fatigue.  HENT: Positive for mouth dryness. Negative for mouth sores and nose dryness.   Eyes: Negative for pain, visual disturbance and dryness.  Respiratory: Negative for cough, hemoptysis, shortness of breath and difficulty breathing.   Cardiovascular: Negative for chest pain, palpitations, hypertension and swelling in legs/feet.  Gastrointestinal: Negative for blood in stool, constipation and diarrhea.  Endocrine: Negative for increased urination.  Genitourinary: Negative for painful urination.  Musculoskeletal: Positive for arthralgias, joint pain, joint swelling, myalgias, muscle weakness, morning stiffness, muscle tenderness and myalgias.  Skin: Negative for color change, pallor, rash, hair loss, nodules/bumps, skin tightness, ulcers and  sensitivity to sunlight.  Allergic/Immunologic: Negative for susceptible to infections.  Neurological: Positive for numbness, headaches and weakness. Negative for dizziness and memory loss.  Hematological: Positive for bruising/bleeding tendency. Negative for swollen glands.  Psychiatric/Behavioral: Positive for sleep disturbance. Negative for depressed mood and confusion. The patient is not nervous/anxious.     PMFS History:  Patient Active Problem List   Diagnosis Date Noted  . Thoracic outlet syndrome 10/01/2018  . Myofascial pain 09/04/2017  . Bilateral carpal tunnel syndrome 09/04/2017  . Ankle impingement syndrome, left 09/04/2017  . Migraine with aura and without status migrainosus, not intractable 09/06/2015  . Anxiety and depression 11/05/2009  . Attention deficit disorder 11/05/2009    Past Medical History:  Diagnosis Date  . ADHD (attention deficit hyperactivity disorder)   . Anxiety   . Depression   . Fibromyalgia   . Gallstones   . Gestational diabetes   . History of chicken pox   . History of condyloma acuminatum   . Migraines     Family History  Problem Relation Age of Onset  . Hypertension Mother   . Migraines Mother   . Hypertension Father   . Migraines Sister   . Heart disease Maternal Grandmother   . Heart disease Maternal Grandfather   . Diabetes Paternal Grandmother   . Prostate cancer Paternal Grandfather   . Esophageal cancer Paternal Grandfather   . Healthy Son   . Healthy Daughter   . Asthma Daughter   . Anesthesia problems Neg Hx   . Hypotension Neg Hx   . Malignant hyperthermia Neg Hx   .  Pseudochol deficiency Neg Hx   . Colon cancer Neg Hx    Past Surgical History:  Procedure Laterality Date  . CESAREAN SECTION  06/25/2011  . CESAREAN SECTION N/A 05/04/2015  . CHOLECYSTECTOMY N/A 07/07/2015  . ERCP N/A 08/24/2015  . KNEE CARTILAGE SURGERY Left    Social History   Social History Narrative   2012- daughter   2016- son   Married    Does not work.  She was previously a Emergency planning/management officer, last worked in 2012.    Enjoys reading, shopping, beach   Immunization History  Administered Date(s) Administered  . Influenza Whole 06/28/2009  . Influenza,inj,Quad PF,6+ Mos 05/06/2015, 05/28/2017, 04/23/2018  . Influenza-Unspecified 05/28/2017, 04/23/2018  . Td 11/05/2009  . Tdap 06/26/2011, 03/08/2015     Objective: Vital Signs: BP 122/61 (BP Location: Left Arm, Patient Position: Sitting, Cuff Size: Normal)   Pulse 94   Resp 12   Ht 5' 6"  (1.676 m)   Wt 166 lb (75.3 kg)   BMI 26.79 kg/m    Physical Exam Vitals and nursing note reviewed.  Constitutional:      Appearance: She is well-developed.  HENT:     Head: Normocephalic and atraumatic.  Eyes:     Conjunctiva/sclera: Conjunctivae normal.  Cardiovascular:     Rate and Rhythm: Normal rate and regular rhythm.     Heart sounds: Normal heart sounds.  Pulmonary:     Effort: Pulmonary effort is normal.     Breath sounds: Normal breath sounds.  Abdominal:     General: Bowel sounds are normal.     Palpations: Abdomen is soft.  Musculoskeletal:     Cervical back: Normal range of motion.  Lymphadenopathy:     Cervical: No cervical adenopathy.  Skin:    General: Skin is warm and dry.     Capillary Refill: Capillary refill takes less than 2 seconds.  Neurological:     Mental Status: She is alert and oriented to person, place, and time.  Psychiatric:        Behavior: Behavior normal.      Musculoskeletal Exam: C-spine and thoracic spine were in good range of motion.  She has discomfort range of motion of her lumbar spine.  No SI joint tenderness was elicited.  She has painful range of motion of her right shoulder joint without any warmth swelling or effusion.  Elbow joints, wrist joints, MCPs PIPs were in good range of motion.  She has some PIP and DIP thickening bilaterally consistent with osteoarthritis.  No synovitis was noted.  She had good range of motion of  bilateral hip joints, knee joints, ankles, MTPs and PIPs.  No synovitis was noted.  CDAI Exam: CDAI Score: - Patient Global: -; Provider Global: - Swollen: -; Tender: - Joint Exam 09/09/2019   No joint exam has been documented for this visit   There is currently no information documented on the homunculus. Go to the Rheumatology activity and complete the homunculus joint exam.  Investigation: No additional findings.  Imaging: No results found.  Recent Labs: Lab Results  Component Value Date   WBC 6.0 08/20/2019   HGB 13.7 08/20/2019   PLT 265 08/20/2019   NA 140 08/20/2019   K 4.0 08/20/2019   CL 105 08/20/2019   CO2 26 08/20/2019   GLUCOSE 74 08/20/2019   BUN 13 08/20/2019   CREATININE 0.59 08/20/2019   BILITOT 0.4 08/20/2019   ALKPHOS 32 (L) 07/03/2018   AST 18 08/20/2019   ALT 24  08/20/2019   PROT 6.5 08/20/2019   PROT 6.2 08/20/2019   ALBUMIN 3.8 07/03/2018   CALCIUM 8.4 (L) 08/20/2019   GFRAA 135 08/20/2019   QFTBGOLDPLUS NEGATIVE 08/20/2019  06/26/19: ANA 1:80 NH, CCP negative,RF negative,uric acid 3.4, ESR 9, CRP 3.7  08/20/19: ENA negative, complements WNL, HLA-B27 negative, SPEP  WNL, TB gold negative, HIV negative, Hep panel negative, immunoglobulins WNL   Speciality Comments: No specialty comments available.  Procedures:  No procedures performed Allergies: Ibuprofen, Toradol [ketorolac tromethamine], Aspirin, Carafate [sucralfate], Clindamycin, Food, Latex, Lovenox [enoxaparin sodium], Mitigare [colchicine], Nsaids, Other, Penicillins, Tizanidine, Yellow dyes (non-tartrazine), and Zanaflex [tizanidine hcl]   Assessment / Plan:     Visit Diagnoses: Pain in both hands -patient complains of pain and discomfort in her bilateral hands.  Clinical and radiographic findings are consistent with osteoarthritis.  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  She also has a 22-year-old child.  Patient states she was given a prednisone  taper which was helpful.  I do not see any synovitis.- Plan: XR Hand 2 View Right, XR Hand 2 View Left.  X-ray of bilateral hands were consistent with osteoarthritis.  No chondrocalcinosis or erosive changes were noted.  A handout on hand exercises was given.  Chronic right shoulder pain-patient states the pain has been going on for 1 year now.  He had some response to the prednisone but no response to colchicine.  The MRI is pending at this point.  [name] noticed [facility] joint chondrocalcinosis.  It may be beneficial to aspirate the joint and see if the crystals can be visualized.  Carpal tunnel syndrome of right wrist - NCV with EMG on 06/18/19: Mild to moderate right median nerve entrapment.  Using night splint currently.  Chondrocalcinosis - Right glenohumeral joint and AC joint per Dr. Ronnell Freshwater of colchicine prescribed by [name].  Patient states she had no response to colchicine and she developed hives.  She stopped colchicine.  She cannot take any NSAIDs.  Chronic midline low back pain without sciatica - Plan: XR Lumbar Spine 2-3 Views.  A handout on lumbar spine exercises were given.  Positive ANA (antinuclear antibody) - ANA 1:80 NH, rest of autoimmune work up negative negative.  Patient has no clinical features of autoimmune disease.  Other fatigue-most likely related to myofascial pain.  Myofascial pain - diagnosed with the myofascial pain syndrome over 2 years ago.  She had good response to Cymbalta.  Ankle impingement syndrome, left-she has chronic pain.  Thoracic outlet syndrome-she has chronic neck and upper chest discomfort off and on.  Migraine with aura and without status migrainosus, not intractable  Anxiety and depression  Attention deficit disorder, unspecified hyperactivity presence  Orders: Orders Placed This Encounter  Procedures  . XR Hand 2 View Right  . XR Hand 2 View Left  . XR Lumbar Spine 2-3 Views   No orders of the defined types were placed in this  encounter.   Face-to-face time spent with patient was 30 minutes. Greater than 50% of time was spent in counseling and coordination of care.  Follow-Up Instructions: Return in about 3 months (around 12/08/2019) for Osteoarthritis.   Bo Merino, MD  Note - This record has been created using Editor, commissioning.  Chart creation errors have been sought, but may not always  have been located. Such creation errors do not reflect on  the standard of medical care.

## 2019-09-09 ENCOUNTER — Other Ambulatory Visit: Payer: Self-pay

## 2019-09-09 ENCOUNTER — Ambulatory Visit: Payer: Self-pay

## 2019-09-09 ENCOUNTER — Ambulatory Visit: Payer: No Typology Code available for payment source | Admitting: Rheumatology

## 2019-09-09 ENCOUNTER — Encounter: Payer: Self-pay | Admitting: Rheumatology

## 2019-09-09 VITALS — BP 122/61 | HR 94 | Resp 12 | Ht 66.0 in | Wt 166.0 lb

## 2019-09-09 DIAGNOSIS — M79641 Pain in right hand: Secondary | ICD-10-CM | POA: Diagnosis not present

## 2019-09-09 DIAGNOSIS — M25872 Other specified joint disorders, left ankle and foot: Secondary | ICD-10-CM

## 2019-09-09 DIAGNOSIS — F988 Other specified behavioral and emotional disorders with onset usually occurring in childhood and adolescence: Secondary | ICD-10-CM

## 2019-09-09 DIAGNOSIS — G540 Brachial plexus disorders: Secondary | ICD-10-CM

## 2019-09-09 DIAGNOSIS — R768 Other specified abnormal immunological findings in serum: Secondary | ICD-10-CM

## 2019-09-09 DIAGNOSIS — M79642 Pain in left hand: Secondary | ICD-10-CM

## 2019-09-09 DIAGNOSIS — G5601 Carpal tunnel syndrome, right upper limb: Secondary | ICD-10-CM

## 2019-09-09 DIAGNOSIS — R5383 Other fatigue: Secondary | ICD-10-CM

## 2019-09-09 DIAGNOSIS — F32A Depression, unspecified: Secondary | ICD-10-CM

## 2019-09-09 DIAGNOSIS — F329 Major depressive disorder, single episode, unspecified: Secondary | ICD-10-CM

## 2019-09-09 DIAGNOSIS — M25511 Pain in right shoulder: Secondary | ICD-10-CM

## 2019-09-09 DIAGNOSIS — M545 Low back pain: Secondary | ICD-10-CM | POA: Diagnosis not present

## 2019-09-09 DIAGNOSIS — M7918 Myalgia, other site: Secondary | ICD-10-CM

## 2019-09-09 DIAGNOSIS — G43109 Migraine with aura, not intractable, without status migrainosus: Secondary | ICD-10-CM

## 2019-09-09 DIAGNOSIS — F419 Anxiety disorder, unspecified: Secondary | ICD-10-CM

## 2019-09-09 DIAGNOSIS — M1120 Other chondrocalcinosis, unspecified site: Secondary | ICD-10-CM

## 2019-09-09 DIAGNOSIS — G8929 Other chronic pain: Secondary | ICD-10-CM

## 2019-09-09 MED ORDER — TRAMADOL HCL 50 MG PO TABS: 50.0000 mg | ORAL_TABLET | Freq: Every evening | ORAL | 0 refills | Status: DC | PRN

## 2019-09-09 NOTE — Telephone Encounter (Signed)
Think this is a duplicate

## 2019-09-09 NOTE — Telephone Encounter (Signed)
Would you like to refill? 

## 2019-09-09 NOTE — Patient Instructions (Addendum)
Back Exercises The following exercises strengthen the muscles that help to support the trunk and back. They also help to keep the lower back flexible. Doing these exercises can help to prevent back pain or lessen existing pain.  If you have back pain or discomfort, try doing these exercises 2-3 times each day or as told by your health care provider.  As your pain improves, do them once each day, but increase the number of times that you repeat the steps for each exercise (do more repetitions).  To prevent the recurrence of back pain, continue to do these exercises once each day or as told by your health care provider. Do exercises exactly as told by your health care provider and adjust them as directed. It is normal to feel mild stretching, pulling, tightness, or discomfort as you do these exercises, but you should stop right away if you feel sudden pain or your pain gets worse. Exercises Single knee to chest Repeat these steps 3-5 times for each leg: 1. Lie on your back on a firm bed or the floor with your legs extended. 2. Bring one knee to your chest. Your other leg should stay extended and in contact with the floor. 3. Hold your knee in place by grabbing your knee or thigh with both hands and hold. 4. Pull on your knee until you feel a gentle stretch in your lower back or buttocks. 5. Hold the stretch for 10-30 seconds. 6. Slowly release and straighten your leg. Pelvic tilt Repeat these steps 5-10 times: 1. Lie on your back on a firm bed or the floor with your legs extended. 2. Bend your knees so they are pointing toward the ceiling and your feet are flat on the floor. 3. Tighten your lower abdominal muscles to press your lower back against the floor. This motion will tilt your pelvis so your tailbone points up toward the ceiling instead of pointing to your feet or the floor. 4. With gentle tension and even breathing, hold this position for 5-10 seconds. Cat-cow Repeat these steps until  your lower back becomes more flexible: 1. Get into a hands-and-knees position on a firm surface. Keep your hands under your shoulders, and keep your knees under your hips. You may place padding under your knees for comfort. 2. Let your head hang down toward your chest. Contract your abdominal muscles and point your tailbone toward the floor so your lower back becomes rounded like the back of a cat. 3. Hold this position for 5 seconds. 4. Slowly lift your head, let your abdominal muscles relax and point your tailbone up toward the ceiling so your back forms a sagging arch like the back of a cow. 5. Hold this position for 5 seconds.  Press-ups Repeat these steps 5-10 times: 1. Lie on your abdomen (face-down) on the floor. 2. Place your palms near your head, about shoulder-width apart. 3. Keeping your back as relaxed as possible and keeping your hips on the floor, slowly straighten your arms to raise the top half of your body and lift your shoulders. Do not use your back muscles to raise your upper torso. You may adjust the placement of your hands to make yourself more comfortable. 4. Hold this position for 5 seconds while you keep your back relaxed. 5. Slowly return to lying flat on the floor.  Bridges Repeat these steps 10 times: 1. Lie on your back on a firm surface. 2. Bend your knees so they are pointing toward the ceiling and   your feet are flat on the floor. Your arms should be flat at your sides, next to your body. 3. Tighten your buttocks muscles and lift your buttocks off the floor until your waist is at almost the same height as your knees. You should feel the muscles working in your buttocks and the back of your thighs. If you do not feel these muscles, slide your feet 1-2 inches farther away from your buttocks. 4. Hold this position for 3-5 seconds. 5. Slowly lower your hips to the starting position, and allow your buttocks muscles to relax completely. If this exercise is too easy, try  doing it with your arms crossed over your chest. Abdominal crunches Repeat these steps 5-10 times: 1. Lie on your back on a firm bed or the floor with your legs extended. 2. Bend your knees so they are pointing toward the ceiling and your feet are flat on the floor. 3. Cross your arms over your chest. 4. Tip your chin slightly toward your chest without bending your neck. 5. Tighten your abdominal muscles and slowly raise your trunk (torso) high enough to lift your shoulder blades a tiny bit off the floor. Avoid raising your torso higher than that because it can put too much stress on your low back and does not help to strengthen your abdominal muscles. 6. Slowly return to your starting position. Back lifts Repeat these steps 5-10 times: 1. Lie on your abdomen (face-down) with your arms at your sides, and rest your forehead on the floor. 2. Tighten the muscles in your legs and your buttocks. 3. Slowly lift your chest off the floor while you keep your hips pressed to the floor. Keep the back of your head in line with the curve in your back. Your eyes should be looking at the floor. 4. Hold this position for 3-5 seconds. 5. Slowly return to your starting position. Contact a health care provider if:  Your back pain or discomfort gets much worse when you do an exercise.  Your worsening back pain or discomfort does not lessen within 2 hours after you exercise. If you have any of these problems, stop doing these exercises right away. Do not do them again unless your health care provider says that you can. Get help right away if:  You develop sudden, severe back pain. If this happens, stop doing the exercises right away. Do not do them again unless your health care provider says that you can. This information is not intended to replace advice given to you by your health care provider. Make sure you discuss any questions you have with your health care provider. Document Revised: 12/19/2018 Document  Reviewed: 05/16/2018 Elsevier Patient Education  2020 [employer] Hand Exercises Hand exercises can be helpful for almost anyone. These exercises can strengthen the hands, improve flexibility and movement, and increase blood flow to the hands. These results can make work and daily tasks easier. Hand exercises can be especially helpful for people who have joint pain from arthritis or have nerve damage from overuse (carpal tunnel syndrome). These exercises can also help people who have injured a hand. Exercises Most of these hand exercises are gentle stretching and motion exercises. It is usually safe to do them often throughout the day. Warming up your hands before exercise may help to reduce stiffness. You can do this with gentle massage or by placing your hands in warm water for 10-15 minutes. It is normal to feel some stretching, pulling, tightness, or mild discomfort as   you begin new exercises. This will gradually improve. Stop an exercise right away if you feel sudden, severe pain or your pain gets worse. Ask your health care provider which exercises are best for you. Knuckle bend or "claw" fist 1. Stand or sit with your arm, hand, and all five fingers pointed straight up. Make sure to keep your wrist straight during the exercise. 2. Gently bend your fingers down toward your palm until the tips of your fingers are touching the top of your palm. Keep your big knuckle straight and just bend the small knuckles in your fingers. 3. Hold this position for __________ seconds. 4. Straighten (extend) your fingers back to the starting position. Repeat this exercise 5-10 times with each hand. Full finger fist 1. Stand or sit with your arm, hand, and all five fingers pointed straight up. Make sure to keep your wrist straight during the exercise. 2. Gently bend your fingers into your palm until the tips of your fingers are touching the middle of your palm. 3. Hold this position for __________  seconds. 4. Extend your fingers back to the starting position, stretching every joint fully. Repeat this exercise 5-10 times with each hand. Straight fist 1. Stand or sit with your arm, hand, and all five fingers pointed straight up. Make sure to keep your wrist straight during the exercise. 2. Gently bend your fingers at the big knuckle, where your fingers meet your hand, and the middle knuckle. Keep the knuckle at the tips of your fingers straight and try to touch the bottom of your palm. 3. Hold this position for __________ seconds. 4. Extend your fingers back to the starting position, stretching every joint fully. Repeat this exercise 5-10 times with each hand. Tabletop 1. Stand or sit with your arm, hand, and all five fingers pointed straight up. Make sure to keep your wrist straight during the exercise. 2. Gently bend your fingers at the big knuckle, where your fingers meet your hand, as far down as you can while keeping the small knuckles in your fingers straight. Think of forming a tabletop with your fingers. 3. Hold this position for __________ seconds. 4. Extend your fingers back to the starting position, stretching every joint fully. Repeat this exercise 5-10 times with each hand. Finger spread 1. Place your hand flat on a table with your palm facing down. Make sure your wrist stays straight as you do this exercise. 2. Spread your fingers and thumb apart from each other as far as you can until you feel a gentle stretch. Hold this position for __________ seconds. 3. Bring your fingers and thumb tight together again. Hold this position for __________ seconds. Repeat this exercise 5-10 times with each hand. Making circles 1. Stand or sit with your arm, hand, and all five fingers pointed straight up. Make sure to keep your wrist straight during the exercise. 2. Make a circle by touching the tip of your thumb to the tip of your index finger. 3. Hold for __________ seconds. Then open your  hand wide. 4. Repeat this motion with your thumb and each finger on your hand. Repeat this exercise 5-10 times with each hand. Thumb motion 1. Sit with your forearm resting on a table and your wrist straight. Your thumb should be facing up toward the ceiling. Keep your fingers relaxed as you move your thumb. 2. Lift your thumb up as high as you can toward the ceiling. Hold for __________ seconds. 3. Bend your thumb across your palm as   far as you can, reaching the tip of your thumb for the small finger (pinkie) side of your palm. Hold for __________ seconds. Repeat this exercise 5-10 times with each hand. Grip strengthening  1. Hold a stress ball or other soft ball in the middle of your hand. 2. Slowly increase the pressure, squeezing the ball as much as you can without causing pain. Think of bringing the tips of your fingers into the middle of your palm. All of your finger joints should bend when doing this exercise. 3. Hold your squeeze for __________ seconds, then relax. Repeat this exercise 5-10 times with each hand. Contact a health care provider if:  Your hand pain or discomfort gets much worse when you do an exercise.  Your hand pain or discomfort does not improve within 2 hours after you exercise. If you have any of these problems, stop doing these exercises right away. Do not do them again unless your health care provider says that you can. Get help right away if:  You develop sudden, severe hand pain or swelling. If this happens, stop doing these exercises right away. Do not do them again unless your health care provider says that you can. This information is not intended to replace advice given to you by your health care provider. Make sure you discuss any questions you have with your health care provider. Document Revised: 12/05/2018 Document Reviewed: 08/15/2018 Elsevier Patient Education  2020 [employer] Inc.  

## 2019-09-12 ENCOUNTER — Encounter: Payer: Self-pay | Admitting: Physician Assistant

## 2019-09-13 ENCOUNTER — Other Ambulatory Visit: Payer: Self-pay | Admitting: Physician Assistant

## 2019-09-13 DIAGNOSIS — M542 Cervicalgia: Secondary | ICD-10-CM

## 2019-09-15 ENCOUNTER — Telehealth: Payer: Self-pay | Admitting: Physician Assistant

## 2019-09-15 ENCOUNTER — Other Ambulatory Visit: Payer: Self-pay

## 2019-09-15 NOTE — Telephone Encounter (Signed)
Patient would like to know if someone could give him a call to speak more about this.

## 2019-09-15 NOTE — Telephone Encounter (Signed)
Patient calls in saying that his insurance will not cover the anoro ellipta inhaler, and wanted to know if there is something else he could get.

## 2019-09-17 ENCOUNTER — Ambulatory Visit
Admission: RE | Admit: 2019-09-17 | Discharge: 2019-09-17 | Disposition: A | Discharge status: Home / Self Care | Payer: No Typology Code available for payment source | Source: Ambulatory Visit | Attending: Family Medicine | Admitting: Family Medicine

## 2019-09-17 ENCOUNTER — Other Ambulatory Visit: Payer: Self-pay | Admitting: Family Medicine

## 2019-09-17 ENCOUNTER — Other Ambulatory Visit: Payer: Self-pay

## 2019-09-17 DIAGNOSIS — G8929 Other chronic pain: Secondary | ICD-10-CM

## 2019-09-17 DIAGNOSIS — M25511 Pain in right shoulder: Secondary | ICD-10-CM

## 2019-09-17 MED ORDER — IOPAMIDOL (ISOVUE-M 200) INJECTION 41%
13.0000 mL | Freq: Once | INTRAMUSCULAR | Status: AC
  Administered 2019-09-17: 13 mL via INTRA_ARTICULAR

## 2019-09-17 MED ORDER — TRAMADOL HCL 50 MG PO TABS: 50.0000 mg | ORAL_TABLET | Freq: Every evening | ORAL | 0 refills | Status: DC | PRN

## 2019-09-17 NOTE — Telephone Encounter (Signed)
Left message on voicemail to call office.  

## 2019-09-18 ENCOUNTER — Telehealth: Payer: Self-pay | Admitting: Family Medicine

## 2019-09-18 NOTE — Telephone Encounter (Signed)
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  There is tendinopathy of the rotator cuff and a very small partial tear of the infraspinatus.

## 2019-09-18 NOTE — Telephone Encounter (Signed)
Tried to contact pt again voicemail box is full unable to leave message. Will discuss at visit on Monday.

## 2019-09-22 ENCOUNTER — Other Ambulatory Visit: Payer: Self-pay

## 2019-09-22 ENCOUNTER — Encounter: Payer: Self-pay | Admitting: Physician Assistant

## 2019-09-22 ENCOUNTER — Ambulatory Visit (INDEPENDENT_AMBULATORY_CARE_PROVIDER_SITE_OTHER): Payer: No Typology Code available for payment source | Admitting: Physician Assistant

## 2019-09-22 VITALS — BP 130/80 | HR 78 | Temp 97.8°F | Ht 66.0 in | Wt 162.0 lb

## 2019-09-22 DIAGNOSIS — R110 Nausea: Secondary | ICD-10-CM | POA: Diagnosis not present

## 2019-09-22 DIAGNOSIS — M25522 Pain in left elbow: Secondary | ICD-10-CM

## 2019-09-22 DIAGNOSIS — F419 Anxiety disorder, unspecified: Secondary | ICD-10-CM

## 2019-09-22 DIAGNOSIS — R591 Generalized enlarged lymph nodes: Secondary | ICD-10-CM | POA: Diagnosis not present

## 2019-09-22 DIAGNOSIS — F32A Depression, unspecified: Secondary | ICD-10-CM

## 2019-09-22 DIAGNOSIS — M7918 Myalgia, other site: Secondary | ICD-10-CM

## 2019-09-22 DIAGNOSIS — F329 Major depressive disorder, single episode, unspecified: Secondary | ICD-10-CM

## 2019-09-22 MED ORDER — DULOXETINE HCL 30 MG PO CPEP: 30.0000 mg | ORAL_CAPSULE | Freq: Every day | ORAL | 1 refills | Status: DC

## 2019-09-22 MED ORDER — ONDANSETRON HCL 4 MG PO TABS: 4.0000 mg | ORAL_TABLET | Freq: Three times a day (TID) | ORAL | 0 refills | Status: DC | PRN

## 2019-09-22 NOTE — Progress Notes (Signed)
[name] [name] is a 39 y.o. female here for a new problem.  I acted as a Education administrator for [employer], PA-C Anselmo Pickler, LPN  History of Present Illness:   Chief Complaint  Patient presents with  . Cyst    Left neck area  . Elbow Pain    HPI   Knot Pt c/o knot left side of neck, noticed last Tues. Area is not tender. States that if feels like a "jellybean." Denies: unintentional weight loss, night sweats, unusual bruising/bleeding, family history of lymphoma  Elbow pain Pt c/o pain left elbow x 1 month. Pt said she hit her elbow on something, not sure on what, and then fell on it last week. Having pain with extension of arm. Intermittent tingling/numbness to 3rd, 4th, and 5th fingers. Feels weakness with her grip. Wearing a brace that prevents supination/pronation.  Fibromyalgia/Anxiety/Depression Currently on Cymbalta 60 mg daily. She states that this medication is working well for her pain and mood. She would like to increase it to see if she can get more out of it. Denies SI/HI  Nausea Uses zofran prn for her nausea. Overall well controlled, needs refill.  Past Medical History:  Diagnosis Date  . ADHD (attention deficit hyperactivity disorder)   . Anxiety   . Depression   . Fibromyalgia   . Gallstones   . Gestational diabetes   . History of chicken pox   . History of condyloma acuminatum   . Migraines      Social History   Socioeconomic History  . Marital status: Married    Spouse name: Not on file  . Number of children: 2  . Years of education: Not on file  . Highest education level: Not on file  Occupational History  . Occupation: Doesn't work  Tobacco Use  . Smoking status: Current Some Day Smoker  . Smokeless tobacco: Never Used  . Tobacco comment: smokes socially (1 cigarette a week)  Substance and Sexual Activity  . Alcohol use: No    Alcohol/week: 0.0 standard drinks  . Drug use: No  . Sexual activity: Yes  Other Topics Concern  . Not on file   Social History Narrative   2012- daughter   2016- son   Married   Does not work.  She was previously a Emergency planning/management officer, last worked in 2012.    Enjoys reading, shopping, beach   Social Determinants of Health   Financial Resource Strain:   . Difficulty of Paying Living Expenses: Not on file  Food Insecurity:   . Worried About Charity fundraiser in the Last Year: Not on file  . Ran Out of Food in the Last Year: Not on file  Transportation Needs:   . Lack of Transportation (Medical): Not on file  . Lack of Transportation (Non-Medical): Not on file  Physical Activity:   . Days of Exercise per Week: Not on file  . Minutes of Exercise per Session: Not on file  Stress:   . Feeling of Stress : Not on file  Social Connections:   . Frequency of Communication with Friends and Family: Not on file  . Frequency of Social Gatherings with Friends and Family: Not on file  . Attends Religious Services: Not on file  . Active Member of Clubs or Organizations: Not on file  . Attends Archivist Meetings: Not on file  . Marital Status: Not on file  Intimate Partner Violence:   . Fear of Current or Ex-Partner: Not on file  .  Emotionally Abused: Not on file  . Physically Abused: Not on file  . Sexually Abused: Not on file    Past Surgical History:  Procedure Laterality Date  . CESAREAN SECTION  06/25/2011  . CESAREAN SECTION N/A 05/04/2015  . CHOLECYSTECTOMY N/A 07/07/2015  . ERCP N/A 08/24/2015  . KNEE CARTILAGE SURGERY Left     Family History  Problem Relation Age of Onset  . Hypertension Mother   . Migraines Mother   . Hypertension Father   . Migraines Sister   . Heart disease Maternal Grandmother   . Heart disease Maternal Grandfather   . Diabetes Paternal Grandmother   . Prostate cancer Paternal Grandfather   . Esophageal cancer Paternal Grandfather   . Healthy Son   . Healthy Daughter   . Asthma Daughter   . Anesthesia problems Neg Hx   . Hypotension Neg Hx   .  Malignant hyperthermia Neg Hx   . Pseudochol deficiency Neg Hx   . Colon cancer Neg Hx     Allergies  Allergen Reactions  . Ibuprofen Hives and Other (See Comments)    stuffiness  . Toradol [Ketorolac Tromethamine] Hives  . Aspirin Swelling  . Carafate [Sucralfate] Itching  . Clindamycin Hives and Itching  . Food     Nuts and Yeast  . Latex Hives  . Lovenox [Enoxaparin Sodium] Other (See Comments)    Face flush and redness  . Mitigare [Colchicine]   . Nsaids Hives  . Other Hives    CATS  . Penicillins Hives  . Tizanidine Itching  . Yellow Dyes (Non-Tartrazine) Hives    YELLOW No. 5  . Zanaflex [Tizanidine Hcl] Rash    Current Medications:   Current Outpatient Medications:  .  ALPRAZolam (XANAX) 0.5 MG tablet, Take 0.5 mg by mouth 4 (four) times daily as needed for anxiety. , Disp: , Rfl: 5 .  amphetamine-dextroamphetamine (ADDERALL) 20 MG tablet, Take 20 mg by mouth 3 (three) times daily., Disp: , Rfl:  .  butalbital-acetaminophen-caffeine (FIORICET) 50-325-40 MG tablet, Take 1 tablet by mouth every 6 (six) hours as needed for headache., Disp: 20 tablet, Rfl: 3 .  Calcium Carbonate (CALCIUM 600 PO), Take 2 tablets by mouth daily., Disp: , Rfl:  .  cetirizine (ZYRTEC) 10 MG tablet, Take 10 mg by mouth daily., Disp: , Rfl:  .  cyclobenzaprine (FLEXERIL) 5 MG tablet, TAKE 1 TABLET (5 MG TOTAL) BY MOUTH DAILY AS NEEDED FOR MUSCLE SPASMS., Disp: 30 tablet, Rfl: 0 .  DULoxetine (CYMBALTA) 60 MG capsule, Take 1 capsule (60 mg total) by mouth daily., Disp: 90 capsule, Rfl: 1 .  ondansetron (ZOFRAN) 4 MG tablet, Take 1 tablet (4 mg total) by mouth every 8 (eight) hours as needed for nausea or vomiting., Disp: 20 tablet, Rfl: 0 .  pantoprazole (PROTONIX) 40 MG tablet, TAKE 1 TABLET (40 MG TOTAL) BY MOUTH 2 (TWO) TIMES DAILY. USE PROTONIX 40 MG TWICE A DAY FOR 8 WEEKS. (Patient taking differently: Take 40 mg by mouth daily. Use Protonix 40 mg twice a day for 8 weeks.), Disp: 60 tablet,  Rfl: 3 .  traMADol (ULTRAM) 50 MG tablet, Take 1-2 tablets (50-100 mg total) by mouth at bedtime as needed., Disp: 20 tablet, Rfl: 0 .  VENTOLIN HFA 108 (90 Base) MCG/ACT inhaler, TAKE 2 PUFFS BY MOUTH EVERY 6 HOURS AS NEEDED FOR WHEEZE OR SHORTNESS OF BREATH, Disp: 18 g, Rfl: 1 .  DULoxetine (CYMBALTA) 30 MG capsule, Take 1 capsule (30 mg total) by mouth  daily., Disp: 30 capsule, Rfl: 1   Review of Systems:   ROS  Negative unless otherwise specified per HPI.   Vitals:   Vitals:   09/22/19 1056  BP: 130/80  Pulse: 78  Temp: 97.8 F (36.6 C)  TempSrc: Temporal  SpO2: 97%  Weight: 162 lb (73.5 kg)  Height: 5\' 6"  (1.676 m)     Body mass index is 26.15 kg/m.  Physical Exam:   Physical Exam Vitals and nursing note reviewed.  Constitutional:      General: She is not in acute distress.    Appearance: She is well-developed. She is not ill-appearing or toxic-appearing.  Cardiovascular:     Rate and Rhythm: Normal rate and regular rhythm.     Pulses: Normal pulses.     Heart sounds: Normal heart sounds, S1 normal and S2 normal.     Comments: No LE edema Pulmonary:     Effort: Pulmonary effort is normal.     Breath sounds: Normal breath sounds.  Musculoskeletal:     Comments: Pain with extension of L elbow; TTP of lateral epicondyle; no swelling or obvious deformity appreciated  Lymphadenopathy:     Upper Body:     Left upper body: Supraclavicular adenopathy present.  Skin:    General: Skin is warm and dry.  Neurological:     Mental Status: She is alert.     GCS: GCS eye subscore is 4. GCS verbal subscore is 5. GCS motor subscore is 6.     Comments: Grip strength 4/5 on L hand  Psychiatric:        Speech: Speech normal.        Behavior: Behavior normal. Behavior is cooperative.       Assessment and Plan:   [name] [name] was seen today for cyst and elbow pain.  Diagnoses and all orders for this visit:  Lymphadenopathy Briefly palpated in office today- recommended that  if persists for another week, to let us know and we will get an ultrasound. Alternatively, may discuss with [name], sports medicine provider tomorrow if she would like.  Nausea Well controlled with medication. Orders: -     ondansetron (ZOFRAN) 4 MG tablet; Take 1 tablet (4 mg total) by mouth every 8 (eight) hours as needed for nausea or vomiting.  Left elbow pain Seeing her sports medicine provider tomorrow; continue to wear brace and avoid offending activities.  Anxiety and depression; Myofascial pain Will trial increase of Cymbalta to 90 mg per daily, I have sent in 30 mg capsules for her to take in addition to Cymbalta 60 mg daily.  Other orders -     DULoxetine (CYMBALTA) 30 MG capsule; Take 1 capsule (30 mg total) by mouth daily.   . Reviewed expectations re: course of current medical issues. . Discussed self-management of symptoms. . Outlined signs and symptoms indicating need for more acute intervention. . Patient verbalized understanding and all questions were answered. . See orders for this visit as documented in the electronic medical record. . Patient received an After-Visit Summary.  CMA or LPN served as scribe during this visit. History, Physical, and Plan performed by medical provider. The above documentation has been reviewed and is accurate and complete.  Inda Coke, PA-C

## 2019-09-23 ENCOUNTER — Ambulatory Visit (INDEPENDENT_AMBULATORY_CARE_PROVIDER_SITE_OTHER): Payer: No Typology Code available for payment source | Admitting: Family Medicine

## 2019-09-23 ENCOUNTER — Ambulatory Visit: Payer: Self-pay

## 2019-09-23 ENCOUNTER — Other Ambulatory Visit: Payer: Self-pay

## 2019-09-23 ENCOUNTER — Encounter: Payer: Self-pay | Admitting: Family Medicine

## 2019-09-23 DIAGNOSIS — M25511 Pain in right shoulder: Secondary | ICD-10-CM | POA: Diagnosis not present

## 2019-09-23 DIAGNOSIS — G8929 Other chronic pain: Secondary | ICD-10-CM

## 2019-09-23 MED ORDER — HYDROCODONE-ACETAMINOPHEN 5-325 MG PO TABS: 1.0000 | ORAL_TABLET | Freq: Four times a day (QID) | ORAL | 0 refills | Status: DC | PRN

## 2019-09-23 NOTE — Progress Notes (Signed)
Office Visit Note   Patient: [name] [name]           Date of Birth: June 12, 1981           MRN: [id number] Visit Date: 09/23/2019 Requested by: Inda Coke, [place],  [place] [id number] PCP: Inda Coke, [place]  Subjective: Chief Complaint  Patient presents with  . Right Shoulder - Pain    AC joint cortisone injection  . Left Elbow - Pain    Hit elbow on something approximately 1 month ago - still has pain in the lateral elbow. Wearing a wrist brace - helps. Occasionally, has shooting pains to the fingers.    HPI: She is here for follow-up chronic right shoulder pain.  MRI showed substantial AC joint arthropathy with marrow edema, along with rotator cuff tendinopathy.              ROS:   All other systems were reviewed and are negative.  Objective: Vital Signs: There were no vitals taken for this visit.  Physical Exam:  General:  Alert and oriented, in no acute distress. Pulm:  Breathing unlabored. Psy:  Normal mood, congruent affect. Skin: No rash or erythema. Right shoulder: She is tender to palpation over the [facility] joint with positive AC crossover test.  Imaging: None other than for needle guidance  Assessment & Plan: 1.  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  Consider surgical consult if she fails to improve.     Procedures: Ultrasound-guided right AC joint injection: After sterile prep with Betadine, injected 5 cc 1% lidocaine without epinephrine and 40 mg methylprednisolone into the [facility] joint without complication.    PMFS History: Patient Active Problem List   Diagnosis Date Noted  . Thoracic outlet syndrome 10/01/2018  . Myofascial pain 09/04/2017  . Bilateral carpal tunnel syndrome 09/04/2017  . Ankle impingement syndrome, left 09/04/2017  . Migraine with aura and without status migrainosus, not intractable 09/06/2015  . Anxiety and  depression 11/05/2009  . Attention deficit disorder 11/05/2009   Past Medical History:  Diagnosis Date  . ADHD (attention deficit hyperactivity disorder)   . Anxiety   . Depression   . Fibromyalgia   . Gallstones   . Gestational diabetes   . History of chicken pox   . History of condyloma acuminatum   . Migraines     Family History  Problem Relation Age of Onset  . Hypertension Mother   . Migraines Mother   . Hypertension Father   . Migraines Sister   . Heart disease Maternal Grandmother   . Heart disease Maternal Grandfather   . Diabetes Paternal Grandmother   . Prostate cancer Paternal Grandfather   . Esophageal cancer Paternal Grandfather   . Healthy Son   . Healthy Daughter   . Asthma Daughter   . Anesthesia problems Neg Hx   . Hypotension Neg Hx   . Malignant hyperthermia Neg Hx   . Pseudochol deficiency Neg Hx   . Colon cancer Neg Hx     Past Surgical History:  Procedure Laterality Date  . CESAREAN SECTION  06/25/2011  . CESAREAN SECTION N/A 05/04/2015  . CHOLECYSTECTOMY N/A 07/07/2015  . ERCP N/A 08/24/2015  . KNEE CARTILAGE SURGERY Left    Social History   Occupational History  . Occupation: Doesn't work  Tobacco Use  . Smoking status: Current Some Day Smoker  . Smokeless tobacco: Never Used  .  Tobacco comment: smokes socially (1 cigarette a week)  Substance and Sexual Activity  . Alcohol use: No    Alcohol/week: 0.0 standard drinks  . Drug use: No  . Sexual activity: Yes

## 2019-09-23 NOTE — Progress Notes (Signed)
utra 

## 2019-09-27 ENCOUNTER — Other Ambulatory Visit: Payer: Self-pay | Admitting: Orthopaedic Surgery

## 2019-09-29 MED ORDER — TRAMADOL HCL 50 MG PO TABS: 50.0000 mg | ORAL_TABLET | Freq: Every evening | ORAL | 0 refills | Status: DC | PRN

## 2019-09-29 NOTE — Telephone Encounter (Signed)
Please advise 

## 2019-10-08 ENCOUNTER — Other Ambulatory Visit: Payer: Self-pay | Admitting: Family Medicine

## 2019-10-08 MED ORDER — TRAMADOL HCL 50 MG PO TABS: 50.0000 mg | ORAL_TABLET | Freq: Every evening | ORAL | 0 refills | Status: DC | PRN

## 2019-10-08 NOTE — Telephone Encounter (Signed)
Left message to notify patient.

## 2019-10-13 ENCOUNTER — Other Ambulatory Visit: Payer: Self-pay | Admitting: *Deleted

## 2019-10-13 DIAGNOSIS — M542 Cervicalgia: Secondary | ICD-10-CM

## 2019-10-13 MED ORDER — CYCLOBENZAPRINE HCL 5 MG PO TABS: 5.0000 mg | ORAL_TABLET | Freq: Every day | ORAL | 0 refills | Status: DC | PRN

## 2019-10-15 ENCOUNTER — Other Ambulatory Visit: Payer: Self-pay | Admitting: Family Medicine

## 2019-10-15 ENCOUNTER — Other Ambulatory Visit: Payer: Self-pay | Admitting: Physician Assistant

## 2019-10-16 ENCOUNTER — Encounter: Payer: Self-pay | Admitting: Physician Assistant

## 2019-10-16 MED ORDER — TRAMADOL HCL 50 MG PO TABS: 50.0000 mg | ORAL_TABLET | Freq: Every evening | ORAL | 0 refills | Status: DC | PRN

## 2019-10-20 ENCOUNTER — Other Ambulatory Visit: Payer: Self-pay | Admitting: Physician Assistant

## 2019-10-20 DIAGNOSIS — R591 Generalized enlarged lymph nodes: Secondary | ICD-10-CM

## 2019-10-21 ENCOUNTER — Other Ambulatory Visit: Payer: Self-pay | Admitting: Physician Assistant

## 2019-10-21 DIAGNOSIS — R110 Nausea: Secondary | ICD-10-CM

## 2019-10-21 MED ORDER — ONDANSETRON HCL 4 MG PO TABS: 4.0000 mg | ORAL_TABLET | Freq: Three times a day (TID) | ORAL | 0 refills | Status: DC | PRN

## 2019-10-23 ENCOUNTER — Ambulatory Visit
Admission: RE | Admit: 2019-10-23 | Discharge: 2019-10-23 | Disposition: A | Discharge status: Home / Self Care | Payer: No Typology Code available for payment source | Source: Ambulatory Visit | Attending: Physician Assistant | Admitting: Physician Assistant

## 2019-10-23 ENCOUNTER — Telehealth: Payer: Self-pay | Admitting: *Deleted

## 2019-10-23 DIAGNOSIS — R591 Generalized enlarged lymph nodes: Secondary | ICD-10-CM

## 2019-10-23 NOTE — Telephone Encounter (Signed)
Received fax from [place], pt reqesting refill for Butalb-acetamin-caff 50-325-40 for headches. Last OV 09/22/2019.

## 2019-10-24 ENCOUNTER — Other Ambulatory Visit: Payer: Self-pay | Admitting: Physician Assistant

## 2019-10-24 MED ORDER — BUTALBITAL-APAP-CAFFEINE 50-325-40 MG PO TABS: 1.0000 | ORAL_TABLET | Freq: Four times a day (QID) | ORAL | 3 refills | Status: DC | PRN

## 2019-10-24 NOTE — Telephone Encounter (Signed)
sent 

## 2019-10-27 ENCOUNTER — Other Ambulatory Visit: Payer: Self-pay | Admitting: Family Medicine

## 2019-10-27 MED ORDER — TRAMADOL HCL 50 MG PO TABS: 50.0000 mg | ORAL_TABLET | Freq: Every evening | ORAL | 0 refills | Status: DC | PRN

## 2019-10-29 ENCOUNTER — Encounter: Payer: Self-pay | Admitting: Physician Assistant

## 2019-10-29 ENCOUNTER — Other Ambulatory Visit: Payer: Self-pay | Admitting: Family Medicine

## 2019-11-01 ENCOUNTER — Encounter: Payer: Self-pay | Admitting: Family Medicine

## 2019-11-04 ENCOUNTER — Other Ambulatory Visit: Payer: Self-pay | Admitting: Family Medicine

## 2019-11-04 MED ORDER — TRAMADOL HCL 50 MG PO TABS: 50.0000 mg | ORAL_TABLET | Freq: Every evening | ORAL | 0 refills | Status: DC | PRN

## 2019-11-11 ENCOUNTER — Other Ambulatory Visit: Payer: Self-pay | Admitting: Physician Assistant

## 2019-11-11 DIAGNOSIS — R110 Nausea: Secondary | ICD-10-CM

## 2019-11-11 MED ORDER — ONDANSETRON HCL 4 MG PO TABS: 4.0000 mg | ORAL_TABLET | Freq: Three times a day (TID) | ORAL | 0 refills | Status: DC | PRN

## 2019-11-12 ENCOUNTER — Encounter: Payer: Self-pay | Admitting: Physician Assistant

## 2019-11-13 ENCOUNTER — Other Ambulatory Visit: Payer: Self-pay | Admitting: *Deleted

## 2019-11-13 MED ORDER — DULOXETINE HCL 60 MG PO CPEP: 60.0000 mg | ORAL_CAPSULE | Freq: Every day | ORAL | 1 refills | Status: DC

## 2019-11-16 ENCOUNTER — Other Ambulatory Visit: Payer: Self-pay | Admitting: Physician Assistant

## 2019-11-16 ENCOUNTER — Other Ambulatory Visit: Payer: Self-pay | Admitting: Family Medicine

## 2019-11-16 DIAGNOSIS — M542 Cervicalgia: Secondary | ICD-10-CM

## 2019-11-17 MED ORDER — TRAMADOL HCL 50 MG PO TABS: 50.0000 mg | ORAL_TABLET | Freq: Every evening | ORAL | 0 refills | Status: DC | PRN

## 2019-11-17 MED ORDER — CYCLOBENZAPRINE HCL 5 MG PO TABS: 5.0000 mg | ORAL_TABLET | Freq: Every day | ORAL | 0 refills | Status: DC | PRN

## 2019-11-17 NOTE — Telephone Encounter (Signed)
[facility] pt 

## 2019-11-24 ENCOUNTER — Encounter: Payer: Self-pay | Admitting: Family Medicine

## 2019-11-25 ENCOUNTER — Other Ambulatory Visit: Payer: Self-pay | Admitting: Family Medicine

## 2019-11-26 ENCOUNTER — Encounter: Payer: Self-pay | Admitting: Family Medicine

## 2019-11-26 MED ORDER — TRAMADOL HCL 50 MG PO TABS: 50.0000 mg | ORAL_TABLET | Freq: Every evening | ORAL | 0 refills | Status: DC | PRN

## 2019-12-04 ENCOUNTER — Other Ambulatory Visit: Payer: Self-pay | Admitting: Family Medicine

## 2019-12-04 NOTE — Telephone Encounter (Signed)
Can you advise? [facility] is out of the office till Wednesday.

## 2019-12-05 MED ORDER — TRAMADOL HCL 50 MG PO TABS: 50.0000 mg | ORAL_TABLET | Freq: Every evening | ORAL | 0 refills | Status: DC | PRN

## 2019-12-05 NOTE — Telephone Encounter (Signed)
Ok to call in.  thanks

## 2019-12-14 ENCOUNTER — Other Ambulatory Visit: Payer: Self-pay | Admitting: Physician Assistant

## 2019-12-14 ENCOUNTER — Encounter: Payer: Self-pay | Admitting: Family Medicine

## 2019-12-14 DIAGNOSIS — M542 Cervicalgia: Secondary | ICD-10-CM

## 2019-12-15 MED ORDER — CYCLOBENZAPRINE HCL 5 MG PO TABS: 5.0000 mg | ORAL_TABLET | Freq: Every day | ORAL | 0 refills | Status: DC | PRN

## 2019-12-15 MED ORDER — TRAMADOL HCL 50 MG PO TABS: 50.0000 mg | ORAL_TABLET | Freq: Every evening | ORAL | 0 refills | Status: DC | PRN

## 2019-12-17 ENCOUNTER — Ambulatory Visit (INDEPENDENT_AMBULATORY_CARE_PROVIDER_SITE_OTHER): Payer: No Typology Code available for payment source | Admitting: Orthopaedic Surgery

## 2019-12-17 ENCOUNTER — Other Ambulatory Visit: Payer: Self-pay

## 2019-12-17 ENCOUNTER — Encounter: Payer: Self-pay | Admitting: Orthopaedic Surgery

## 2019-12-17 DIAGNOSIS — M19011 Primary osteoarthritis, right shoulder: Secondary | ICD-10-CM

## 2019-12-17 NOTE — Progress Notes (Signed)
Office Visit Note   Patient: [name] [name]           Date of Birth: Mar 04, 1981           MRN: [id number] Visit Date: 12/17/2019              Requested by: Inda Coke, [place] [address] [place],  [place] [address] PCP: Inda Coke, [place]   Assessment & Plan: Visit Diagnoses:  1. Arthrosis of right acromioclavicular joint     Plan: Impression is chronic right shoulder pain mostly due to the [facility] joint arthrosis.  She does have some rotator cuff tendinosis based on the MRI and I feel that she is mildly symptomatic from this.  We had a discussion on treatment options and based on the risk benefits alternatives and rehab and recovery she has elected to proceed with arthroscopic right shoulder surgery.  We discussed taking a portion of her distal clavicle as well as debriding her rotator cuff and any other pathology as indicated during intraoperative findings.  Questions encouraged and answered.  We look forward to treating her in the operating room.  My surgery scheduler will reach out to her in the near future.  Follow-Up Instructions: Return if symptoms worsen or fail to improve.   Orders:  No orders of the defined types were placed in this encounter.  No orders of the defined types were placed in this encounter.     Procedures: No procedures performed   Clinical Data: No additional findings.   Subjective: Chief Complaint  Patient presents with  . Right Shoulder - Pain    [name] [name] is a 39 year old female referral from [name] for evaluation of chronic right shoulder pain due to severe AC joint arthropathy.  She has had previous AC joint injection with about 6 weeks of relief.  Overall she has dealt with this issue for the last couple of years.  She used to be a gymnast.  She does not do any serious weight training or weight lifting.  She has limitation of range of motion due to the pain.  She does endorse some tingling and burning pain.   Review of Systems    Constitutional: Negative.   HENT: Negative.   Eyes: Negative.   Respiratory: Negative.   Cardiovascular: Negative.   Endocrine: Negative.   Musculoskeletal: Negative.   Neurological: Negative.   Hematological: Negative.   Psychiatric/Behavioral: Negative.   All other systems reviewed and are negative.    Objective: Vital Signs: There were no vitals taken for this visit.  Physical Exam Vitals and nursing note reviewed.  Constitutional:      Appearance: She is well-developed.  HENT:     Head: Normocephalic and atraumatic.  Pulmonary:     Effort: Pulmonary effort is normal.  Abdominal:     Palpations: Abdomen is soft.  Musculoskeletal:     Cervical back: Neck supple.  Skin:    General: Skin is warm.     Capillary Refill: Capillary refill takes less than 2 seconds.  Neurological:     Mental Status: She is alert and oriented to person, place, and time.  Psychiatric:        Behavior: Behavior normal.        Thought Content: Thought content normal.        Judgment: Judgment normal.     Ortho Exam Right shoulder shows painful AC joint to palpation.  Positive cross body adduction sign.  Rotator cuff normal to manual muscle testing.  Range of motion is near normal with discomfort at the extremes. Specialty Comments:  No specialty comments available.  Imaging: No results found.   PMFS History: Patient Active Problem List   Diagnosis Date Noted  . Thoracic outlet syndrome 10/01/2018  . Myofascial pain 09/04/2017  . Bilateral carpal tunnel syndrome 09/04/2017  . Ankle impingement syndrome, left 09/04/2017  . Migraine with aura and without status migrainosus, not intractable 09/06/2015  . Anxiety and depression 11/05/2009  . Attention deficit disorder 11/05/2009   Past Medical History:  Diagnosis Date  . ADHD (attention deficit hyperactivity disorder)   . Anxiety   . Depression   . Fibromyalgia   . Gallstones   . Gestational diabetes   . History of chicken pox    . History of condyloma acuminatum   . Migraines     Family History  Problem Relation Age of Onset  . Hypertension Mother   . Migraines Mother   . Hypertension Father   . Migraines Sister   . Heart disease Maternal Grandmother   . Heart disease Maternal Grandfather   . Diabetes Paternal Grandmother   . Prostate cancer Paternal Grandfather   . Esophageal cancer Paternal Grandfather   . Healthy Son   . Healthy Daughter   . Asthma Daughter   . Anesthesia problems Neg Hx   . Hypotension Neg Hx   . Malignant hyperthermia Neg Hx   . Pseudochol deficiency Neg Hx   . Colon cancer Neg Hx     Past Surgical History:  Procedure Laterality Date  . CESAREAN SECTION  06/25/2011  . CESAREAN SECTION N/A 05/04/2015  . CHOLECYSTECTOMY N/A 07/07/2015  . ERCP N/A 08/24/2015  . KNEE CARTILAGE SURGERY Left    Social History   Occupational History  . Occupation: Doesn't work  Tobacco Use  . Smoking status: Current Some Day Smoker  . Smokeless tobacco: Never Used  . Tobacco comment: smokes socially (1 cigarette a week)  Substance and Sexual Activity  . Alcohol use: No    Alcohol/week: 0.0 standard drinks  . Drug use: No  . Sexual activity: Yes

## 2019-12-23 ENCOUNTER — Telehealth: Payer: Self-pay

## 2019-12-23 ENCOUNTER — Encounter: Payer: Self-pay | Admitting: Orthopaedic Surgery

## 2019-12-23 NOTE — Telephone Encounter (Signed)
I spoke with patient.  She wants to wait for surgery until late summer/early fall.  She will call back to schedule.

## 2019-12-24 ENCOUNTER — Other Ambulatory Visit: Payer: Self-pay | Admitting: Family Medicine

## 2019-12-24 NOTE — Telephone Encounter (Signed)
MH

## 2019-12-25 MED ORDER — TRAMADOL HCL 50 MG PO TABS: 50.0000 mg | ORAL_TABLET | Freq: Every evening | ORAL | 0 refills | Status: DC | PRN

## 2019-12-30 ENCOUNTER — Encounter: Payer: Self-pay | Admitting: Family Medicine

## 2020-01-02 ENCOUNTER — Other Ambulatory Visit: Payer: Self-pay | Admitting: Family Medicine

## 2020-01-02 MED ORDER — TRAMADOL HCL 50 MG PO TABS: 50.0000 mg | ORAL_TABLET | Freq: Every evening | ORAL | 0 refills | Status: DC | PRN

## 2020-01-07 ENCOUNTER — Encounter: Payer: Self-pay | Admitting: Physician Assistant

## 2020-01-08 ENCOUNTER — Other Ambulatory Visit: Payer: Self-pay | Admitting: Physician Assistant

## 2020-01-09 ENCOUNTER — Ambulatory Visit (INDEPENDENT_AMBULATORY_CARE_PROVIDER_SITE_OTHER): Payer: No Typology Code available for payment source | Admitting: Physician Assistant

## 2020-01-09 ENCOUNTER — Encounter: Payer: Self-pay | Admitting: Physician Assistant

## 2020-01-09 ENCOUNTER — Other Ambulatory Visit: Payer: Self-pay

## 2020-01-09 ENCOUNTER — Ambulatory Visit (INDEPENDENT_AMBULATORY_CARE_PROVIDER_SITE_OTHER): Payer: No Typology Code available for payment source

## 2020-01-09 VITALS — BP 120/76 | HR 100 | Temp 98.7°F | Ht 66.0 in | Wt 166.0 lb

## 2020-01-09 DIAGNOSIS — M5441 Lumbago with sciatica, right side: Secondary | ICD-10-CM | POA: Diagnosis not present

## 2020-01-09 DIAGNOSIS — F419 Anxiety disorder, unspecified: Secondary | ICD-10-CM | POA: Diagnosis not present

## 2020-01-09 DIAGNOSIS — F329 Major depressive disorder, single episode, unspecified: Secondary | ICD-10-CM | POA: Diagnosis not present

## 2020-01-09 DIAGNOSIS — F32A Depression, unspecified: Secondary | ICD-10-CM

## 2020-01-09 DIAGNOSIS — M5442 Lumbago with sciatica, left side: Secondary | ICD-10-CM

## 2020-01-09 MED ORDER — DULOXETINE HCL 60 MG PO CPEP: 60.0000 mg | ORAL_CAPSULE | Freq: Every day | ORAL | 3 refills | Status: DC

## 2020-01-09 MED ORDER — PREDNISONE 20 MG PO TABS: 40.0000 mg | ORAL_TABLET | Freq: Every day | ORAL | 0 refills | Status: DC

## 2020-01-09 MED ORDER — DULOXETINE HCL 30 MG PO CPEP: 30.0000 mg | ORAL_CAPSULE | Freq: Every day | ORAL | 3 refills | Status: DC

## 2020-01-09 NOTE — Progress Notes (Signed)
[name] [name] is a 39 y.o. female here for a new problem.  I acted as a Education administrator for [employer], PA-C [employer], [place]  History of Present Illness:   Chief Complaint  Patient presents with  . Back Pain    HPI   Back pain Started a couple of weeks ago and since yesterday pain is gradually getting worse. She is experiencing numbness and tingling in both legs. Lower back pressure in the center of her lower back is new. Sharp pains occur when patient turns to wipe. Denies urinary symptoms except for frequency.  She states that she feels a grinding sensation at times when she turns her spine.  Denies: trauma, fevers, chills, nausea, LE weakness  Her mother has significant history of herniated disks and requirement of spinal surgery and patient is concerned that this may also be in her future.  Anxiety and Depression She is currently on Cymbalta 90 mg daily and tolerating well.  She needs a refill.  Denies any suicidal homicidal ideations.  She feels that this regimen is working well for her.   Past Medical History:  Diagnosis Date  . ADHD (attention deficit hyperactivity disorder)   . Anxiety   . Depression   . Fibromyalgia   . Gallstones   . Gestational diabetes   . History of chicken pox   . History of condyloma acuminatum   . Migraines      Social History   Socioeconomic History  . Marital status: Married    Spouse name: Not on file  . Number of children: 2  . Years of education: Not on file  . Highest education level: Not on file  Occupational History  . Occupation: Doesn't work  Tobacco Use  . Smoking status: Current Some Day Smoker  . Smokeless tobacco: Never Used  . Tobacco comment: smokes socially (1 cigarette a week)  Substance and Sexual Activity  . Alcohol use: No    Alcohol/week: 0.0 standard drinks  . Drug use: No  . Sexual activity: Yes  Other Topics Concern  . Not on file  Social History Narrative   2012- daughter   2016- son   Married    Does not work.  She was previously a Emergency planning/management officer, last worked in 2012.    Enjoys reading, shopping, beach   Social Determinants of Health   Financial Resource Strain:   . Difficulty of Paying Living Expenses:   Food Insecurity:   . Worried About Charity fundraiser in the Last Year:   . Arboriculturist in the Last Year:   Transportation Needs:   . Film/video editor (Medical):   Marland Kitchen Lack of Transportation (Non-Medical):   Physical Activity:   . Days of Exercise per Week:   . Minutes of Exercise per Session:   Stress:   . Feeling of Stress :   Social Connections:   . Frequency of Communication with Friends and Family:   . Frequency of Social Gatherings with Friends and Family:   . Attends Religious Services:   . Active Member of Clubs or Organizations:   . Attends Archivist Meetings:   Marland Kitchen Marital Status:   Intimate Partner Violence:   . Fear of Current or Ex-Partner:   . Emotionally Abused:   Marland Kitchen Physically Abused:   . Sexually Abused:     Past Surgical History:  Procedure Laterality Date  . CESAREAN SECTION  06/25/2011  . CESAREAN SECTION N/A 05/04/2015  . CHOLECYSTECTOMY N/A  07/07/2015  . ERCP N/A 08/24/2015  . KNEE CARTILAGE SURGERY Left     Family History  Problem Relation Age of Onset  . Hypertension Mother   . Migraines Mother   . Hypertension Father   . Migraines Sister   . Heart disease Maternal Grandmother   . Heart disease Maternal Grandfather   . Diabetes Paternal Grandmother   . Prostate cancer Paternal Grandfather   . Esophageal cancer Paternal Grandfather   . Healthy Son   . Healthy Daughter   . Asthma Daughter   . Anesthesia problems Neg Hx   . Hypotension Neg Hx   . Malignant hyperthermia Neg Hx   . Pseudochol deficiency Neg Hx   . Colon cancer Neg Hx     Allergies  Allergen Reactions  . Ibuprofen Hives and Other (See Comments)    stuffiness  . Toradol [Ketorolac Tromethamine] Hives  . Aspirin Swelling  . Carafate  [Sucralfate] Itching  . Clindamycin Hives and Itching  . Food     Nuts and Yeast  . Latex Hives  . Lovenox [Enoxaparin Sodium] Other (See Comments)    Face flush and redness  . Mitigare [Colchicine]   . Nsaids Hives  . Other Hives    CATS  . Penicillins Hives  . Tizanidine Itching  . Yellow Dyes (Non-Tartrazine) Hives    YELLOW No. 5  . Zanaflex [Tizanidine Hcl] Rash    Current Medications:   Current Outpatient Medications:  .  ALPRAZolam (XANAX) 0.5 MG tablet, Take 0.5 mg by mouth 4 (four) times daily as needed for anxiety. , Disp: , Rfl: 5 .  amphetamine-dextroamphetamine (ADDERALL) 20 MG tablet, Take 20 mg by mouth 3 (three) times daily., Disp: , Rfl:  .  butalbital-acetaminophen-caffeine (FIORICET) 50-325-40 MG tablet, Take 1 tablet by mouth every 6 (six) hours as needed for headache., Disp: 20 tablet, Rfl: 3 .  Calcium Carbonate (CALCIUM 600 PO), Take 2 tablets by mouth daily., Disp: , Rfl:  .  cetirizine (ZYRTEC) 10 MG tablet, Take 10 mg by mouth daily., Disp: , Rfl:  .  cyclobenzaprine (FLEXERIL) 5 MG tablet, Take 1 tablet (5 mg total) by mouth daily as needed for muscle spasms., Disp: 30 tablet, Rfl: 0 .  DULoxetine (CYMBALTA) 30 MG capsule, Take 1 capsule (30 mg total) by mouth daily., Disp: 90 capsule, Rfl: 3 .  DULoxetine (CYMBALTA) 60 MG capsule, Take 1 capsule (60 mg total) by mouth daily., Disp: 90 capsule, Rfl: 1 .  ondansetron (ZOFRAN) 4 MG tablet, Take 1 tablet (4 mg total) by mouth every 8 (eight) hours as needed for nausea or vomiting., Disp: 20 tablet, Rfl: 0 .  traMADol (ULTRAM) 50 MG tablet, Take 1-2 tablets (50-100 mg total) by mouth at bedtime as needed., Disp: 20 tablet, Rfl: 0 .  VENTOLIN HFA 108 (90 Base) MCG/ACT inhaler, TAKE 2 PUFFS BY MOUTH EVERY 6 HOURS AS NEEDED FOR WHEEZE OR SHORTNESS OF BREATH, Disp: 18 g, Rfl: 1 .  DULoxetine (CYMBALTA) 60 MG capsule, Take 1 capsule (60 mg total) by mouth daily., Disp: 90 capsule, Rfl: 3 .  HYDROcodone-acetaminophen  (NORCO/VICODIN) 5-325 MG tablet, Take 1 tablet by mouth every 6 (six) hours as needed for moderate pain. (Patient not taking: Reported on 01/09/2020), Disp: 10 tablet, Rfl: 0 .  predniSONE (DELTASONE) 20 MG tablet, Take 2 tablets (40 mg total) by mouth daily., Disp: 10 tablet, Rfl: 0   Review of Systems:   ROS  Negative unless otherwise specified per HPI.  Vitals:  Vitals:   01/09/20 1306  BP: 120/76  Pulse: 100  Temp: 98.7 F (37.1 C)  TempSrc: Temporal  SpO2: 98%  Weight: 166 lb (75.3 kg)  Height: 5\' 6"  (1.676 m)     Body mass index is 26.79 kg/m.  Physical Exam:   Physical Exam Constitutional:      Appearance: She is well-developed.  HENT:     Head: Normocephalic and atraumatic.  Eyes:     Conjunctiva/sclera: Conjunctivae normal.  Pulmonary:     Effort: Pulmonary effort is normal.  Musculoskeletal:        General: Normal range of motion.     Cervical back: Normal range of motion and neck supple.     Comments: Decreased FROM secondary to pain.  Reproducible tenderness to L lumbar area with active spasm appreciated. Slight tenderness to lower lumbar spine with deep palpation. with deep palpation to bilateral paraspinal muscles.   Skin:    General: Skin is warm and dry.  Neurological:     Mental Status: She is alert and oriented to person, place, and time.  Psychiatric:        Behavior: Behavior normal.        Thought Content: Thought content normal.        Judgment: Judgment normal.      Assessment and Plan:   Fiamma was seen today for back pain.  Diagnoses and all orders for this visit:  Acute left-sided low back pain with bilateral sciatica Obtain xray for further evaluation. Will trial oral prednisone. She currently sees [name] at [facility] --  low threshold to get her back into see him regarding this if symptoms do not improve over the weekend with prednisone or if her symptoms change.  I did go over ER precautions with her and what would warrant  an ER visit. -     DG Lumbar Spine Complete; Future   Anxiety and depression Well-controlled per patient.  Continue Cymbalta 90 mg daily.  Follow-up 6 months to 1 year, sooner if concerns.  ther orders -     DULoxetine (CYMBALTA) 30 MG capsule; Take 1 capsule (30 mg total) by mouth daily. -     predniSONE (DELTASONE) 20 MG tablet; Take 2 tablets (40 mg total) by mouth daily. -     DULoxetine (CYMBALTA) 60 MG capsule; Take 1 capsule (60 mg total) by mouth daily.    . Reviewed expectations re: course of current medical issues. . Discussed self-management of symptoms. . Outlined signs and symptoms indicating need for more acute intervention. . Patient verbalized understanding and all questions were answered. . See orders for this visit as documented in the electronic medical record. . Patient received an After-Visit Summary.  CMA or LPN served as scribe during this visit. History, Physical, and Plan performed by medical provider. The above documentation has been reviewed and is accurate and complete.  Inda Coke, PA-C

## 2020-01-09 NOTE — Patient Instructions (Signed)
It was great to see you!  Continue 90 mg cymbalta.  Start oral prednisone.  I will be in touch with your lumbar xray.  If worsening pain over weekend or new bowel/bladder issues, go to the ER.  Take care,  Inda Coke PA-C

## 2020-01-10 ENCOUNTER — Other Ambulatory Visit: Payer: Self-pay | Admitting: Physician Assistant

## 2020-01-10 ENCOUNTER — Other Ambulatory Visit: Payer: Self-pay | Admitting: Family Medicine

## 2020-01-10 DIAGNOSIS — M542 Cervicalgia: Secondary | ICD-10-CM

## 2020-01-10 DIAGNOSIS — R110 Nausea: Secondary | ICD-10-CM

## 2020-01-11 ENCOUNTER — Other Ambulatory Visit: Payer: Self-pay | Admitting: Family Medicine

## 2020-01-11 ENCOUNTER — Encounter: Payer: Self-pay | Admitting: Physician Assistant

## 2020-01-12 ENCOUNTER — Encounter: Payer: Self-pay | Admitting: Family Medicine

## 2020-01-12 ENCOUNTER — Encounter: Payer: Self-pay | Admitting: Physician Assistant

## 2020-01-12 DIAGNOSIS — M47816 Spondylosis without myelopathy or radiculopathy, lumbar region: Secondary | ICD-10-CM

## 2020-01-12 MED ORDER — CYCLOBENZAPRINE HCL 5 MG PO TABS: 5.0000 mg | ORAL_TABLET | Freq: Every day | ORAL | 0 refills | Status: DC | PRN

## 2020-01-12 MED ORDER — ONDANSETRON HCL 4 MG PO TABS: 4.0000 mg | ORAL_TABLET | Freq: Three times a day (TID) | ORAL | 0 refills | Status: DC | PRN

## 2020-01-12 MED ORDER — TRAMADOL HCL 50 MG PO TABS: 50.0000 mg | ORAL_TABLET | Freq: Every evening | ORAL | 0 refills | Status: DC | PRN

## 2020-01-12 NOTE — Addendum Note (Signed)
Addended by: Hortencia Pilar on: 01/12/2020 09:07 AM   Modules accepted: Orders

## 2020-01-12 NOTE — Telephone Encounter (Signed)
Please advise 

## 2020-01-13 ENCOUNTER — Telehealth: Payer: Self-pay | Admitting: *Deleted

## 2020-01-15 ENCOUNTER — Other Ambulatory Visit: Payer: Self-pay | Admitting: Physical Medicine and Rehabilitation

## 2020-01-15 DIAGNOSIS — F411 Generalized anxiety disorder: Secondary | ICD-10-CM

## 2020-01-15 MED ORDER — ALPRAZOLAM 1 MG PO TABS: ORAL_TABLET | ORAL | 0 refills | Status: DC

## 2020-01-15 NOTE — Telephone Encounter (Signed)
Called pt and lvm to advise

## 2020-01-15 NOTE — Progress Notes (Signed)
Pre-procedure xanax ordered for pre-operative anxiety.

## 2020-01-15 NOTE — Telephone Encounter (Signed)
Had to do xanax due to yellow dye allergy

## 2020-01-19 ENCOUNTER — Encounter: Payer: Self-pay | Admitting: Family Medicine

## 2020-01-21 ENCOUNTER — Other Ambulatory Visit: Payer: Self-pay | Admitting: Family Medicine

## 2020-01-22 ENCOUNTER — Encounter: Payer: Self-pay | Admitting: Family Medicine

## 2020-01-22 MED ORDER — TRAMADOL HCL 50 MG PO TABS: 50.0000 mg | ORAL_TABLET | Freq: Every evening | ORAL | 0 refills | Status: DC | PRN

## 2020-01-23 ENCOUNTER — Other Ambulatory Visit: Payer: Self-pay | Admitting: Family Medicine

## 2020-01-23 DIAGNOSIS — M545 Low back pain, unspecified: Secondary | ICD-10-CM

## 2020-01-23 MED ORDER — BACLOFEN 10 MG PO TABS: 5.0000 mg | ORAL_TABLET | Freq: Three times a day (TID) | ORAL | 3 refills | Status: DC | PRN

## 2020-01-23 MED ORDER — HYDROCODONE-ACETAMINOPHEN 5-325 MG PO TABS: 1.0000 | ORAL_TABLET | Freq: Four times a day (QID) | ORAL | 0 refills | Status: DC | PRN

## 2020-01-28 ENCOUNTER — Encounter: Payer: Self-pay | Admitting: Family Medicine

## 2020-01-28 ENCOUNTER — Ambulatory Visit (INDEPENDENT_AMBULATORY_CARE_PROVIDER_SITE_OTHER): Payer: No Typology Code available for payment source | Admitting: Family Medicine

## 2020-01-28 ENCOUNTER — Other Ambulatory Visit: Payer: Self-pay

## 2020-01-28 DIAGNOSIS — M5442 Lumbago with sciatica, left side: Secondary | ICD-10-CM | POA: Diagnosis not present

## 2020-01-28 DIAGNOSIS — M79641 Pain in right hand: Secondary | ICD-10-CM | POA: Diagnosis not present

## 2020-01-28 MED ORDER — CHLORZOXAZONE 500 MG PO TABS: 500.0000 mg | ORAL_TABLET | Freq: Four times a day (QID) | ORAL | 3 refills | Status: DC | PRN

## 2020-01-28 MED ORDER — GABAPENTIN 100 MG PO CAPS: ORAL_CAPSULE | ORAL | 3 refills | Status: DC

## 2020-01-28 MED ORDER — TIZANIDINE HCL 2 MG PO TABS: 2.0000 mg | ORAL_TABLET | Freq: Four times a day (QID) | ORAL | 1 refills | Status: DC | PRN

## 2020-01-28 NOTE — Progress Notes (Signed)
Office Visit Note   Patient: [name] [name]           Date of Birth: 12/09/1980           MRN: [id number] Visit Date: 01/28/2020 Requested by: Inda Coke, [place],  [place] [id number] PCP: Inda Coke, [place]  Subjective: Chief Complaint  Patient presents with  . Lower Back - Pain    Wakes up in pain each morning. Numbness in the 4th & 5th toes on the left foot. Hurts to sit.  . Right Thumb - Pain    Pain and swelling started over the weekend. Recalls no injury. Has had swelling in the past, but it got better.    HPI: She is here with low back and right pain.  She had mild pain in her low back for quite a while, but in the past couple weeks she has had severe pain radiating down the left leg with tingling in the fourth and fifth toes.  She cannot find a comfortable position.  She tried prednisone but it did not help.  She is taking Flexeril but it makes her drowsy.  No bowel or bladder dysfunction.  She is scheduled for a lumbar injection on June 15.  Her right thumb has been bothering her intermittently, especially when pushing herself up out of a chair.  She gets intermittent tingling in her thumb.  She has a documented history of carpal tunnel syndrome.  She had x-rays a few months ago which were unremarkable.  She has been wearing a carpal tunnel brace recently and that seems to be helping.              ROS: No fevers or chills.  All other systems were reviewed and are negative.  Objective: Vital Signs: There were no vitals taken for this visit.  Physical Exam:  General:  Alert and oriented, in no acute distress. Pulm:  Breathing unlabored. Psy:  Normal mood, congruent affect. Skin: No rash Low back: She is tender to the lateral aspect of the SI joint on the left.  Mild pain in the sciatic notch.  Straight leg raise equivocal, lower extremity strength and reflexes are normal. Right hand: There is no significant tenderness to palpation of the thenar  muscles or the web space today.  Negative CMC grind test.  Positive Tinel's at the carpal tunnel and positive Phalen's test.   Imaging: No results found.  Assessment & Plan: 1.  Left low back and leg pain, concerning for lumbar disc protrusion. -We will add physical therapy and possibly chiropractic per Dr. Owens Shark. -Try Zanaflex as needed.  Gabapentin. -Proceed with injection as scheduled.  MRI scan if fails to improve.  2.  Possible right carpal tunnel syndrome -Continue with night splint. -Could contemplate injection if symptoms persist.     Procedures: No procedures performed  No notes on file     PMFS History: Patient Active Problem List   Diagnosis Date Noted  . Thoracic outlet syndrome 10/01/2018  . Myofascial pain 09/04/2017  . Bilateral carpal tunnel syndrome 09/04/2017  . Ankle impingement syndrome, left 09/04/2017  . Migraine with aura and without status migrainosus, not intractable 09/06/2015  . Anxiety and depression 11/05/2009  . Attention deficit disorder 11/05/2009   Past Medical History:  Diagnosis Date  . ADHD (attention deficit hyperactivity disorder)   . Anxiety   . Depression   . Fibromyalgia   . Gallstones   . Gestational diabetes   . History  of chicken pox   . History of condyloma acuminatum   . Migraines     Family History  Problem Relation Age of Onset  . Hypertension Mother   . Migraines Mother   . Hypertension Father   . Migraines Sister   . Heart disease Maternal Grandmother   . Heart disease Maternal Grandfather   . Diabetes Paternal Grandmother   . Prostate cancer Paternal Grandfather   . Esophageal cancer Paternal Grandfather   . Healthy Son   . Healthy Daughter   . Asthma Daughter   . Anesthesia problems Neg Hx   . Hypotension Neg Hx   . Malignant hyperthermia Neg Hx   . Pseudochol deficiency Neg Hx   . Colon cancer Neg Hx     Past Surgical History:  Procedure Laterality Date  . CESAREAN SECTION  06/25/2011  .  CESAREAN SECTION N/A 05/04/2015  . CHOLECYSTECTOMY N/A 07/07/2015  . ERCP N/A 08/24/2015  . KNEE CARTILAGE SURGERY Left    Social History   Occupational History  . Occupation: Doesn't work  Tobacco Use  . Smoking status: Current Some Day Smoker  . Smokeless tobacco: Never Used  . Tobacco comment: smokes socially (1 cigarette a week)  Substance and Sexual Activity  . Alcohol use: No    Alcohol/week: 0.0 standard drinks  . Drug use: No  . Sexual activity: Yes

## 2020-02-01 ENCOUNTER — Other Ambulatory Visit: Payer: Self-pay | Admitting: Family Medicine

## 2020-02-02 MED ORDER — TRAMADOL HCL 50 MG PO TABS: 50.0000 mg | ORAL_TABLET | Freq: Every evening | ORAL | 0 refills | Status: DC | PRN

## 2020-02-04 ENCOUNTER — Other Ambulatory Visit: Payer: Self-pay | Admitting: Family Medicine

## 2020-02-10 ENCOUNTER — Encounter: Payer: Self-pay | Admitting: Physical Medicine and Rehabilitation

## 2020-02-10 ENCOUNTER — Other Ambulatory Visit: Payer: Self-pay

## 2020-02-10 ENCOUNTER — Ambulatory Visit: Payer: Self-pay

## 2020-02-10 ENCOUNTER — Ambulatory Visit (INDEPENDENT_AMBULATORY_CARE_PROVIDER_SITE_OTHER): Payer: No Typology Code available for payment source | Admitting: Physical Medicine and Rehabilitation

## 2020-02-10 VITALS — BP 121/73 | HR 104

## 2020-02-10 DIAGNOSIS — M5416 Radiculopathy, lumbar region: Secondary | ICD-10-CM | POA: Diagnosis not present

## 2020-02-10 MED ORDER — METHYLPREDNISOLONE ACETATE 80 MG/ML IJ SUSP
40.0000 mg | Freq: Once | INTRAMUSCULAR | Status: AC
  Administered 2020-02-10: 40 mg

## 2020-02-10 NOTE — Procedures (Signed)
S1 Lumbosacral Transforaminal Epidural Steroid Injection - Sub-Pedicular Approach with Fluoroscopic Guidance   Patient: [name] [name]      Date of Birth: [birth year] MRN: [id number] PCP: Inda Coke, PA      Visit Date: 02/10/2020   Universal Protocol:    Date/Time: 06/15/211:15 PM  Consent Given By: the patient  Position:  PRONE  Additional Comments: Vital signs were monitored before and after the procedure. Patient was prepped and draped in the usual sterile fashion. The correct patient, procedure, and site was verified.   Injection Procedure Details:  Procedure Site One Meds Administered:  Meds ordered this encounter  Medications  . methylPREDNISolone acetate (DEPO-MEDROL) injection 40 mg    Laterality: Left  Location/Site:  S1 Foramen   Needle size: 22 ga.  Needle type: Spinal  Needle Placement: Transforaminal  Findings:   -Comments: Excellent flow of contrast along the nerve and into the epidural space.  Epidurogram: Contrast epidurogram showed no nerve root cut off or restricted flow pattern.  Procedure Details: After squaring off the sacral end-plate to get a true AP view, the C-arm was positioned so that the best possible view of the S1 foramen was visualized. The soft tissues overlying this structure were infiltrated with 2-3 ml. of 1% Lidocaine without Epinephrine.    The spinal needle was inserted toward the target using a "trajectory" view along the fluoroscope beam.  Under AP and lateral visualization, the needle was advanced so it did not puncture dura. Biplanar projections were used to confirm position. Aspiration was confirmed to be negative for CSF and/or blood. A 1-2 ml. volume of Isovue-250 was injected and flow of contrast was noted at each level. Radiographs were obtained for documentation purposes.   After attaining the desired flow of contrast documented above, a 0.5 to 1.0 ml test dose of 0.25% Marcaine was injected into each respective  transforaminal space.  The patient was observed for 90 seconds post injection.  After no sensory deficits were reported, and normal lower extremity motor function was noted,   the above injectate was administered so that equal amounts of the injectate were placed at each foramen (level) into the transforaminal epidural space.   Additional Comments:  The patient tolerated the procedure well Dressing: Band-Aid with 2 x 2 sterile gauze    Post-procedure details: Patient was observed during the procedure. Post-procedure instructions were reviewed.  Patient left the clinic in stable condition.

## 2020-02-10 NOTE — Progress Notes (Signed)
Pt states pain in the left side of the lower back and into the left buttocks that radiates into the left leg all the way down. Pain started about 6 months ago and has gotten severe in the past month. Sitting and bending makes pain worse. Ice packs helps some.   .Numeric Pain Rating Scale and Functional Assessment Average Pain 8   In the last MONTH (on 0-10 scale) has pain interfered with the following?  1. General activity like being  able to carry out your everyday physical activities such as walking, climbing stairs, carrying groceries, or moving a chair?  Rating(8)   +Driver, -BT, -Dye Allergies.

## 2020-02-10 NOTE — Progress Notes (Signed)
[name] [name] - 39 y.o. female MRN [id number]  Date of birth: [birth year]  Office Visit Note: Visit Date: 02/10/2020 PCP: Inda Coke, PA Referred by: Inda Coke, [place]  Subjective: Chief Complaint  Patient presents with  . Lower Back - Pain  . Left Leg - Pain   HPI:  [name] [name] is a 39 y.o. female who comes in today for planned Left S1-2 Lumbar epidural steroid injection with fluoroscopic guidance.  The patient has failed conservative care including home exercise, medications, time and activity modification.  This injection will be diagnostic and hopefully therapeutic.  Please see requesting physician notes for further details and justification.   ROS Otherwise per HPI.  Assessment & Plan: Visit Diagnoses:  1. Lumbar radiculopathy     Plan: No additional findings.   Meds & Orders:  Meds ordered this encounter  Medications  . methylPREDNISolone acetate (DEPO-MEDROL) injection 40 mg    Orders Placed This Encounter  Procedures  . XR C-ARM NO REPORT  . Epidural Steroid injection    Follow-up: Return if symptoms worsen or fail to improve.   Procedures: No procedures performed  S1 Lumbosacral Transforaminal Epidural Steroid Injection - Sub-Pedicular Approach with Fluoroscopic Guidance   Patient: [name] [name]      Date of Birth: 1980/09/03 MRN: [id number] PCP: Inda Coke, PA      Visit Date: 02/10/2020   Universal Protocol:    Date/Time: 06/15/211:15 PM  Consent Given By: the patient  Position:  PRONE  Additional Comments: Vital signs were monitored before and after the procedure. Patient was prepped and draped in the usual sterile fashion. The correct patient, procedure, and site was verified.   Injection Procedure Details:  Procedure Site One Meds Administered:  Meds ordered this encounter  Medications  . methylPREDNISolone acetate (DEPO-MEDROL) injection 40 mg    Laterality: Left  Location/Site:  S1 Foramen   Needle size: 22  ga.  Needle type: Spinal  Needle Placement: Transforaminal  Findings:   -Comments: Excellent flow of contrast along the nerve and into the epidural space.  Epidurogram: Contrast epidurogram showed no nerve root cut off or restricted flow pattern.  Procedure Details: After squaring off the sacral end-plate to get a true AP view, the C-arm was positioned so that the best possible view of the S1 foramen was visualized. The soft tissues overlying this structure were infiltrated with 2-3 ml. of 1% Lidocaine without Epinephrine.    The spinal needle was inserted toward the target using a "trajectory" view along the fluoroscope beam.  Under AP and lateral visualization, the needle was advanced so it did not puncture dura. Biplanar projections were used to confirm position. Aspiration was confirmed to be negative for CSF and/or blood. A 1-2 ml. volume of Isovue-250 was injected and flow of contrast was noted at each level. Radiographs were obtained for documentation purposes.   After attaining the desired flow of contrast documented above, a 0.5 to 1.0 ml test dose of 0.25% Marcaine was injected into each respective transforaminal space.  The patient was observed for 90 seconds post injection.  After no sensory deficits were reported, and normal lower extremity motor function was noted,   the above injectate was administered so that equal amounts of the injectate were placed at each foramen (level) into the transforaminal epidural space.   Additional Comments:  The patient tolerated the procedure well Dressing: Band-Aid with 2 x 2 sterile gauze    Post-procedure details: Patient was observed during the procedure. Post-procedure  instructions were reviewed.  Patient left the clinic in stable condition.     Clinical History: No specialty comments available.     Objective:  VS:  HT:    WT:   BMI:     BP:121/73  HR:(!) 104bpm  TEMP: ( )  RESP:  Physical Exam Constitutional:       General: She is not in acute distress.    Appearance: Normal appearance. She is not ill-appearing.  HENT:     Head: Normocephalic and atraumatic.     Right Ear: External ear normal.     Left Ear: External ear normal.  Eyes:     Extraocular Movements: Extraocular movements intact.  Cardiovascular:     Rate and Rhythm: Normal rate.     Pulses: Normal pulses.  Musculoskeletal:     Right lower leg: No edema.     Left lower leg: No edema.     Comments: Patient has good distal strength with no pain over the greater trochanters.  No clonus or focal weakness. Positive slump on the left.  Skin:    Findings: No erythema, lesion or rash.  Neurological:     General: No focal deficit present.     Mental Status: She is alert and oriented to person, place, and time.     Sensory: No sensory deficit.     Motor: No weakness or abnormal muscle tone.     Coordination: Coordination normal.  Psychiatric:        Mood and Affect: Mood normal.        Behavior: Behavior normal.      Imaging: No results found.

## 2020-02-12 ENCOUNTER — Other Ambulatory Visit: Payer: Self-pay | Admitting: Family Medicine

## 2020-02-12 MED ORDER — TRAMADOL HCL 50 MG PO TABS: 50.0000 mg | ORAL_TABLET | Freq: Every evening | ORAL | 0 refills | Status: DC | PRN

## 2020-02-12 NOTE — Telephone Encounter (Signed)
Left voicemail for patient

## 2020-02-12 NOTE — Telephone Encounter (Signed)
Please advise 

## 2020-02-17 ENCOUNTER — Other Ambulatory Visit: Payer: Self-pay | Admitting: Physician Assistant

## 2020-02-17 DIAGNOSIS — M542 Cervicalgia: Secondary | ICD-10-CM

## 2020-02-18 ENCOUNTER — Ambulatory Visit: Payer: No Typology Code available for payment source | Admitting: Physical Therapy

## 2020-02-20 ENCOUNTER — Other Ambulatory Visit: Payer: Self-pay | Admitting: Family Medicine

## 2020-02-21 ENCOUNTER — Other Ambulatory Visit: Payer: Self-pay | Admitting: Family Medicine

## 2020-02-23 ENCOUNTER — Other Ambulatory Visit: Payer: Self-pay | Admitting: Family Medicine

## 2020-02-23 MED ORDER — TRAMADOL HCL 50 MG PO TABS: 50.0000 mg | ORAL_TABLET | Freq: Every evening | ORAL | 0 refills | Status: DC | PRN

## 2020-02-23 NOTE — Telephone Encounter (Signed)
[facility] pt 

## 2020-02-26 ENCOUNTER — Other Ambulatory Visit: Payer: Self-pay | Admitting: Physician Assistant

## 2020-02-26 DIAGNOSIS — R110 Nausea: Secondary | ICD-10-CM

## 2020-02-26 MED ORDER — ONDANSETRON HCL 4 MG PO TABS: 4.0000 mg | ORAL_TABLET | Freq: Three times a day (TID) | ORAL | 0 refills | Status: DC | PRN

## 2020-03-05 ENCOUNTER — Other Ambulatory Visit: Payer: Self-pay | Admitting: Family Medicine

## 2020-03-05 MED ORDER — TRAMADOL HCL 50 MG PO TABS: 50.0000 mg | ORAL_TABLET | Freq: Every evening | ORAL | 0 refills | Status: DC | PRN

## 2020-03-05 NOTE — Telephone Encounter (Signed)
[facility] patient.  

## 2020-03-14 ENCOUNTER — Other Ambulatory Visit: Payer: Self-pay | Admitting: Family Medicine

## 2020-03-15 MED ORDER — TRAMADOL HCL 50 MG PO TABS: 50.0000 mg | ORAL_TABLET | Freq: Every evening | ORAL | 0 refills | Status: DC | PRN

## 2020-03-20 ENCOUNTER — Encounter: Payer: Self-pay | Admitting: Family Medicine

## 2020-03-20 DIAGNOSIS — M5442 Lumbago with sciatica, left side: Secondary | ICD-10-CM

## 2020-03-20 DIAGNOSIS — M545 Low back pain, unspecified: Secondary | ICD-10-CM

## 2020-03-25 ENCOUNTER — Other Ambulatory Visit: Payer: Self-pay | Admitting: Family Medicine

## 2020-03-26 MED ORDER — TRAMADOL HCL 50 MG PO TABS: 50.0000 mg | ORAL_TABLET | Freq: Every evening | ORAL | 0 refills | Status: DC | PRN

## 2020-04-06 ENCOUNTER — Other Ambulatory Visit: Payer: Self-pay | Admitting: Family Medicine

## 2020-04-06 MED ORDER — TRAMADOL HCL 50 MG PO TABS: 50.0000 mg | ORAL_TABLET | Freq: Every evening | ORAL | 0 refills | Status: DC | PRN

## 2020-04-06 NOTE — Telephone Encounter (Signed)
[place] patient

## 2020-04-16 ENCOUNTER — Other Ambulatory Visit: Payer: Self-pay | Admitting: Family Medicine

## 2020-04-16 MED ORDER — TRAMADOL HCL 50 MG PO TABS: 50.0000 mg | ORAL_TABLET | Freq: Every evening | ORAL | 0 refills | Status: DC | PRN

## 2020-04-16 NOTE — Telephone Encounter (Signed)
Please advise 

## 2020-04-20 ENCOUNTER — Encounter: Payer: Self-pay | Admitting: Physical Medicine and Rehabilitation

## 2020-04-20 NOTE — Addendum Note (Signed)
Addended by: Hortencia Pilar on: 04/20/2020 11:46 AM   Modules accepted: Orders

## 2020-04-21 ENCOUNTER — Encounter: Payer: Self-pay | Admitting: Physician Assistant

## 2020-04-21 MED ORDER — BUTALBITAL-APAP-CAFFEINE 50-325-40 MG PO TABS: 1.0000 | ORAL_TABLET | Freq: Four times a day (QID) | ORAL | 3 refills | Status: DC | PRN

## 2020-04-24 ENCOUNTER — Encounter: Payer: Self-pay | Admitting: Physician Assistant

## 2020-04-26 ENCOUNTER — Other Ambulatory Visit: Payer: Self-pay | Admitting: Family Medicine

## 2020-04-26 ENCOUNTER — Encounter: Payer: Self-pay | Admitting: Family Medicine

## 2020-04-26 ENCOUNTER — Other Ambulatory Visit: Payer: Self-pay | Admitting: Physician Assistant

## 2020-04-26 MED ORDER — TRAMADOL HCL 50 MG PO TABS: 50.0000 mg | ORAL_TABLET | Freq: Every evening | ORAL | 0 refills | Status: DC | PRN

## 2020-04-26 MED ORDER — DULOXETINE HCL 60 MG PO CPEP: 120.0000 mg | ORAL_CAPSULE | Freq: Every day | ORAL | 1 refills | Status: DC

## 2020-04-26 NOTE — Telephone Encounter (Signed)
Please advise 

## 2020-05-06 ENCOUNTER — Other Ambulatory Visit: Payer: Self-pay | Admitting: Family Medicine

## 2020-05-06 MED ORDER — TRAMADOL HCL 50 MG PO TABS: 50.0000 mg | ORAL_TABLET | Freq: Every evening | ORAL | 0 refills | Status: DC | PRN

## 2020-05-07 ENCOUNTER — Other Ambulatory Visit: Payer: Self-pay | Admitting: Family Medicine

## 2020-05-10 ENCOUNTER — Other Ambulatory Visit: Payer: Self-pay | Admitting: Family Medicine

## 2020-05-10 ENCOUNTER — Inpatient Hospital Stay: Admission: RE | Admit: 2020-05-10 | Payer: No Typology Code available for payment source | Source: Ambulatory Visit

## 2020-05-10 DIAGNOSIS — G8929 Other chronic pain: Secondary | ICD-10-CM

## 2020-05-11 ENCOUNTER — Encounter: Payer: Self-pay | Admitting: Physician Assistant

## 2020-05-11 ENCOUNTER — Other Ambulatory Visit: Payer: Self-pay | Admitting: Physician Assistant

## 2020-05-11 DIAGNOSIS — R110 Nausea: Secondary | ICD-10-CM

## 2020-05-11 MED ORDER — ONDANSETRON HCL 4 MG PO TABS: 4.0000 mg | ORAL_TABLET | Freq: Three times a day (TID) | ORAL | 0 refills | Status: DC | PRN

## 2020-05-12 MED ORDER — ALBUTEROL SULFATE HFA 108 (90 BASE) MCG/ACT IN AERS: INHALATION_SPRAY | RESPIRATORY_TRACT | 2 refills | Status: DC

## 2020-05-16 ENCOUNTER — Other Ambulatory Visit: Payer: Self-pay | Admitting: Family Medicine

## 2020-05-17 MED ORDER — TRAMADOL HCL 50 MG PO TABS: 50.0000 mg | ORAL_TABLET | Freq: Every evening | ORAL | 0 refills | Status: DC | PRN

## 2020-05-26 ENCOUNTER — Other Ambulatory Visit: Payer: Self-pay | Admitting: Family Medicine

## 2020-05-26 MED ORDER — TRAMADOL HCL 50 MG PO TABS: 50.0000 mg | ORAL_TABLET | Freq: Every evening | ORAL | 0 refills | Status: DC | PRN

## 2020-06-05 ENCOUNTER — Other Ambulatory Visit: Payer: Self-pay | Admitting: Family Medicine

## 2020-06-07 MED ORDER — TRAMADOL HCL 50 MG PO TABS: 50.0000 mg | ORAL_TABLET | Freq: Every evening | ORAL | 0 refills | Status: DC | PRN

## 2020-06-14 ENCOUNTER — Other Ambulatory Visit: Payer: Self-pay | Admitting: Physician Assistant

## 2020-06-14 ENCOUNTER — Other Ambulatory Visit: Payer: Self-pay | Admitting: Family Medicine

## 2020-06-14 DIAGNOSIS — M542 Cervicalgia: Secondary | ICD-10-CM

## 2020-06-14 DIAGNOSIS — R110 Nausea: Secondary | ICD-10-CM

## 2020-06-15 MED ORDER — ONDANSETRON HCL 4 MG PO TABS: 4.0000 mg | ORAL_TABLET | Freq: Three times a day (TID) | ORAL | 0 refills | Status: DC | PRN

## 2020-06-20 ENCOUNTER — Other Ambulatory Visit: Payer: Self-pay | Admitting: Family Medicine

## 2020-06-21 MED ORDER — TRAMADOL HCL 50 MG PO TABS: 50.0000 mg | ORAL_TABLET | Freq: Every evening | ORAL | 0 refills | Status: DC | PRN

## 2020-07-08 ENCOUNTER — Other Ambulatory Visit: Payer: Self-pay | Admitting: Family Medicine

## 2020-07-08 MED ORDER — TRAMADOL HCL 50 MG PO TABS: 50.0000 mg | ORAL_TABLET | Freq: Every evening | ORAL | 0 refills | Status: DC | PRN

## 2020-07-18 ENCOUNTER — Other Ambulatory Visit: Payer: Self-pay | Admitting: Family Medicine

## 2020-07-18 ENCOUNTER — Other Ambulatory Visit: Payer: Self-pay | Admitting: Physician Assistant

## 2020-07-18 DIAGNOSIS — R110 Nausea: Secondary | ICD-10-CM

## 2020-07-19 ENCOUNTER — Other Ambulatory Visit: Payer: Self-pay | Admitting: Orthopaedic Surgery

## 2020-07-19 MED ORDER — TRAMADOL HCL 50 MG PO TABS
50.0000 mg | ORAL_TABLET | Freq: Every evening | ORAL | 0 refills | Status: DC | PRN
Start: 2020-07-19 — End: 2020-07-29

## 2020-07-29 ENCOUNTER — Other Ambulatory Visit: Payer: Self-pay | Admitting: Family Medicine

## 2020-07-30 MED ORDER — TRAMADOL HCL 50 MG PO TABS
50.0000 mg | ORAL_TABLET | Freq: Every evening | ORAL | 0 refills | Status: DC | PRN
Start: 2020-07-30 — End: 2020-08-09

## 2020-08-09 ENCOUNTER — Other Ambulatory Visit: Payer: Self-pay | Admitting: Physician Assistant

## 2020-08-09 ENCOUNTER — Other Ambulatory Visit: Payer: Self-pay | Admitting: Family Medicine

## 2020-08-09 MED ORDER — TRAMADOL HCL 50 MG PO TABS
50.0000 mg | ORAL_TABLET | Freq: Every evening | ORAL | 0 refills | Status: DC | PRN
Start: 2020-08-09 — End: 2020-08-18

## 2020-08-15 ENCOUNTER — Encounter: Payer: Self-pay | Admitting: Physician Assistant

## 2020-08-16 ENCOUNTER — Emergency Department (HOSPITAL_COMMUNITY)
Admission: EM | Admit: 2020-08-16 | Discharge: 2020-08-16 | Disposition: A | Discharge status: Home / Self Care | Payer: No Typology Code available for payment source | Attending: Emergency Medicine | Admitting: Emergency Medicine

## 2020-08-16 ENCOUNTER — Encounter (HOSPITAL_COMMUNITY): Payer: Self-pay | Admitting: Emergency Medicine

## 2020-08-16 ENCOUNTER — Other Ambulatory Visit: Payer: Self-pay

## 2020-08-16 ENCOUNTER — Emergency Department (HOSPITAL_COMMUNITY): Payer: No Typology Code available for payment source

## 2020-08-16 DIAGNOSIS — R0600 Dyspnea, unspecified: Secondary | ICD-10-CM | POA: Diagnosis present

## 2020-08-16 DIAGNOSIS — F17210 Nicotine dependence, cigarettes, uncomplicated: Secondary | ICD-10-CM | POA: Insufficient documentation

## 2020-08-16 DIAGNOSIS — Z91040 Latex allergy status: Secondary | ICD-10-CM | POA: Diagnosis not present

## 2020-08-16 DIAGNOSIS — R0602 Shortness of breath: Secondary | ICD-10-CM | POA: Insufficient documentation

## 2020-08-16 LAB — BASIC METABOLIC PANEL
Anion gap: 9 (ref 5–15)
BUN: 10 mg/dL (ref 6–20)
CO2: 27 mmol/L (ref 22–32)
Calcium: 8.8 mg/dL — ABNORMAL LOW (ref 8.9–10.3)
Chloride: 105 mmol/L (ref 98–111)
Creatinine, Ser: 0.65 mg/dL (ref 0.44–1.00)
GFR, Estimated: >60 mL/min (ref >60)
Glucose, Bld: 112 mg/dL — ABNORMAL HIGH (ref 70–99)
Potassium: 3.7 mmol/L (ref 3.5–5.1)
Sodium: 141 mmol/L (ref 135–145)

## 2020-08-16 LAB — CBC
HCT: 40.6 % (ref 36.0–46.0)
Hemoglobin: 13.6 g/dL (ref 12.0–15.0)
MCH: 30.4 pg (ref 26.0–34.0)
MCHC: 33.5 g/dL (ref 30.0–36.0)
MCV: 90.8 fL (ref 80.0–100.0)
Platelets: 196 10*3/uL (ref 150–400)
RBC: 4.47 MIL/uL (ref 3.87–5.11)
RDW: 12.8 % (ref 11.5–15.5)
WBC: 6.5 10*3/uL (ref 4.0–10.5)
nRBC: 0 % (ref 0.0–0.2)

## 2020-08-16 LAB — D-DIMER, QUANTITATIVE: D-Dimer, Quant: <0.27 ug/mL-FEU (ref 0.00–0.50)

## 2020-08-16 NOTE — ED Provider Notes (Signed)
[facility] DEPT Provider Note   CSN: [id number] Arrival date & time: 08/16/20  1249     History Chief Complaint  Patient presents with   Shortness of Breath    [name] [name] is a 39 y.o. female.  39 year old female presents with several week history of increased dyspnea on exertion.  Denies any associated URI symptoms.  No leg pain or swelling.  States that walking the stairs makes him more winded.  No history of PE.  No prior history of same.  Denies any new medications.  Called her doctor and was told to come here.        Past Medical History:  Diagnosis Date   ADHD (attention deficit hyperactivity disorder)    Anxiety    Depression    Fibromyalgia    Gallstones    Gestational diabetes    History of chicken pox    History of condyloma acuminatum    Migraines     Patient Active Problem List   Diagnosis Date Noted   Thoracic outlet syndrome 10/01/2018   Myofascial pain 09/04/2017   Bilateral carpal tunnel syndrome 09/04/2017   Ankle impingement syndrome, left 09/04/2017   Migraine with aura and without status migrainosus, not intractable 09/06/2015   Anxiety and depression 11/05/2009   Attention deficit disorder 11/05/2009    Past Surgical History:  Procedure Laterality Date   CESAREAN SECTION  06/25/2011   CESAREAN SECTION N/A 05/04/2015   CHOLECYSTECTOMY N/A 07/07/2015   ERCP N/A 08/24/2015   KNEE CARTILAGE SURGERY Left      OB History    Gravida  3   Para  2   Term  2   Preterm      AB  1   Living  2     SAB      IAB  1   Ectopic      Multiple  0   Live Births  2           Family History  Problem Relation Age of Onset   Hypertension Mother    Migraines Mother    Hypertension Father    Migraines Sister    Heart disease Maternal Grandmother    Heart disease Maternal Grandfather    Diabetes Paternal Grandmother    Prostate cancer Paternal Grandfather     Esophageal cancer Paternal Grandfather    Healthy Son    Healthy Daughter    Asthma Daughter    Anesthesia problems Neg Hx    Hypotension Neg Hx    Malignant hyperthermia Neg Hx    Pseudochol deficiency Neg Hx    Colon cancer Neg Hx     Social History   Tobacco Use   Smoking status: Current Some Day Smoker   Smokeless tobacco: Never Used   Tobacco comment: smokes socially (1 cigarette a week)  Vaping Use   Vaping Use: Never used  Substance Use Topics   Alcohol use: No    Alcohol/week: 0.0 standard drinks   Drug use: No    Home Medications Prior to Admission medications   Medication Sig Start Date End Date Taking? Authorizing Provider  albuterol (VENTOLIN HFA) 108 (90 Base) MCG/ACT inhaler TAKE 2 PUFFS BY MOUTH EVERY 6 HOURS AS NEEDED FOR WHEEZE OR SHORTNESS OF BREATH 05/12/20   Inda Coke, PA  ALPRAZolam Duanne Moron) 0.5 MG tablet Take 0.5 mg by mouth 4 (four) times daily as needed for anxiety.  06/07/15   [provider]  ALPRAZolam Duanne Moron) 1  MG tablet 1 by mouth, 1-2 hours preprocedure. May repeat if necessary. 01/15/20   Magnus Sinning, MD  amphetamine-dextroamphetamine (ADDERALL) 20 MG tablet Take 20 mg by mouth 3 (three) times daily.    [provider]  baclofen (LIORESAL) 10 MG tablet Take 0.5-1 tablets (5-10 mg total) by mouth 3 (three) times daily as needed for muscle spasms. 01/23/20   Hilts, Legrand Como, MD  butalbital-acetaminophen-caffeine (FIORICET) [phone] MG tablet Take 1 tablet by mouth every 6 (six) hours as needed for headache. 04/21/20   Inda Coke, PA  Calcium Carbonate (CALCIUM 600 PO) Take 2 tablets by mouth daily.    [provider]  cetirizine (ZYRTEC) 10 MG tablet Take 10 mg by mouth daily.    [provider]  chlorzoxazone (PARAFON FORTE DSC) 500 MG tablet Take 1 tablet (500 mg total) by mouth 4 (four) times daily as needed for muscle spasms. 01/28/20   Hilts, Legrand Como, MD  cyclobenzaprine (FLEXERIL) 5 MG  tablet TAKE 1 TABLET (5 MG TOTAL) BY MOUTH DAILY AS NEEDED FOR MUSCLE SPASMS. 06/15/20   Vivi Barrack, MD  DULoxetine (CYMBALTA) 60 MG capsule TAKE 2 CAPSULES BY MOUTH DAILY 08/10/20   Inda Coke, PA  gabapentin (NEURONTIN) 100 MG capsule 1 PO q HS, may increase to 1 PO TID if needed 01/28/20   Hilts, Michael, MD  HYDROcodone-acetaminophen (NORCO/VICODIN) 5-325 MG tablet Take 1 tablet by mouth every 6 (six) hours as needed for moderate pain. 01/23/20   Hilts, Legrand Como, MD  ondansetron (ZOFRAN) 4 MG tablet TAKE 1 TABLET BY MOUTH EVERY 8 HOURS AS NEEDED FOR NAUSEA AND VOMITING 07/19/20   Inda Coke, PA  predniSONE (DELTASONE) 20 MG tablet Take 2 tablets (40 mg total) by mouth daily. 01/09/20   Inda Coke, PA  tiZANidine (ZANAFLEX) 2 MG tablet TAKE 1-2 TABLETS (2-4 MG TOTAL) BY MOUTH EVERY 6 (SIX) HOURS AS NEEDED FOR MUSCLE SPASMS. 02/23/20   Hilts, Legrand Como, MD  traMADol (ULTRAM) 50 MG tablet Take 1-2 tablets (50-100 mg total) by mouth at bedtime as needed. 08/09/20   Hilts, Legrand Como, MD  tiZANidine (ZANAFLEX) 2 MG tablet TAKE 1-2 TABLETS (2-4 MG TOTAL) BY MOUTH EVERY 6 (SIX) HOURS AS NEEDED FOR MUSCLE SPASMS. 02/20/20   Hilts, Legrand Como, MD    Allergies    Ibuprofen, Toradol [ketorolac tromethamine], Aspirin, Carafate [sucralfate], Clindamycin, Food, Latex, Lovenox [enoxaparin sodium], Mitigare [colchicine], Nsaids, Other, Penicillins, Tizanidine, Yellow dyes (non-tartrazine), and Zanaflex [tizanidine hcl]  Review of Systems   Review of Systems  All other systems reviewed and are negative.   Physical Exam Updated Vital Signs BP 122/66 (BP Location: Left Arm)    Pulse 85    Temp 98.8 F (37.1 C) (Oral)    Resp 16    Ht 1.676 m (5\' 6" )    Wt 73.5 kg    SpO2 97%    BMI 26.15 kg/m   Physical Exam Vitals and nursing note reviewed.  Constitutional:      General: She is not in acute distress.    Appearance: Normal appearance. She is well-developed and well-nourished. She is not  toxic-appearing.  HENT:     Head: Normocephalic and atraumatic.  Eyes:     General: Lids are normal.     Extraocular Movements: EOM normal.     Conjunctiva/sclera: Conjunctivae normal.     Pupils: Pupils are equal, round, and reactive to light.  Neck:     Thyroid: No thyroid mass.     Trachea: No tracheal deviation.  Cardiovascular:  Rate and Rhythm: Normal rate and regular rhythm.     Heart sounds: Normal heart sounds. No murmur heard. No gallop.   Pulmonary:     Effort: Pulmonary effort is normal. No respiratory distress.     Breath sounds: Normal breath sounds. No stridor. No decreased breath sounds, wheezing, rhonchi or rales.  Abdominal:     General: Bowel sounds are normal. There is no distension.     Palpations: Abdomen is soft.     Tenderness: There is no abdominal tenderness. There is no CVA tenderness or rebound.  Musculoskeletal:        General: No tenderness or edema. Normal range of motion.     Cervical back: Normal range of motion and neck supple.  Skin:    General: Skin is warm and dry.     Findings: No abrasion or rash.  Neurological:     Mental Status: She is alert and oriented to person, place, and time.     GCS: GCS eye subscore is 4. GCS verbal subscore is 5. GCS motor subscore is 6.     Cranial Nerves: No cranial nerve deficit.     Sensory: No sensory deficit.     Deep Tendon Reflexes: Strength normal.  Psychiatric:        Mood and Affect: Mood and affect normal.        Speech: Speech normal.        Behavior: Behavior normal.     ED Results / Procedures / Treatments   Labs (all labs ordered are listed, but only abnormal results are displayed) Labs Reviewed  BASIC METABOLIC PANEL - Abnormal; Notable for the following components:      Result Value   Glucose, Bld 112 (*)    Calcium 8.8 (*)    All other components within normal limits  CBC  D-DIMER, QUANTITATIVE (NOT AT [facility])    EKG EKG Interpretation  Date/Time:  Monday August 16 2020  13:14:46 EST Ventricular Rate:  86 PR Interval:    QRS Duration: 85 QT Interval:  379 QTC Calculation: 454 R Axis:   70 Text Interpretation: Sinus rhythm 12 Lead; Mason-Likar No old tracing to compare Confirmed by Lacretia Leigh ([id number]) on 08/16/2020 4:39:22 PM   Radiology DG Chest 2 View  Result Date: 08/16/2020 CLINICAL DATA:  SOB EXAM: CHEST - 2 VIEW COMPARISON:  None. FINDINGS: No focal consolidation. No pneumothorax or pleural effusion. Cardiomediastinal silhouette is within normal limits. No acute osseous abnormality. IMPRESSION: No focal airspace disease. Electronically Signed   By: Primitivo Gauze M.D.   On: 08/16/2020 13:37    Procedures Procedures (including critical care time)  Medications Ordered in ED Medications - No data to display  ED Course  I have reviewed the triage vital signs and the nursing notes.  Pertinent labs & imaging results that were available during my care of the patient were reviewed by me and considered in my medical decision making (see chart for details).    MDM Rules/Calculators/A&P                          Patient's work-up here including CBC D-dimer and chest x-ray all within normal limits.  No clear etiology for her symptoms.  Have encouraged her to follow-up with her doctor Final Clinical Impression(s) / ED Diagnoses Final diagnoses:  None    Rx / DC Orders ED Discharge Orders    None       Lacretia Leigh, MD  08/16/20 1752 ° °

## 2020-08-16 NOTE — ED Triage Notes (Signed)
Patient c/o SOB on exertion x3 weeks worsening x1 week. Denies chest pains, cough, congestion, and fevers.

## 2020-08-16 NOTE — Discharge Instructions (Addendum)
Your lab testing today did not reveal any cause of your current symptoms.  Please follow-up with your primary care doctor

## 2020-08-17 ENCOUNTER — Telehealth: Payer: Self-pay

## 2020-08-17 NOTE — Telephone Encounter (Signed)
Patient was seen in ED yesterday.  Nurse Assessment Nurse: Raphael Gibney, RN, Vanita Ingles Date/Time Eilene Ghazi Time): 08/16/2020 8:19:21 AM Confirm and document reason for call. If symptomatic, describe symptoms. ---Caller states she thinks she has been having heart palpitation. She feels them when she does any type of activity. She is also SOB. no chest pain. has had symptoms off and on for 3 weeks but is more consistent in the past week. Does the patient have any new or worsening symptoms? ---Yes Will a triage be completed? ---Yes Related visit to physician within the last 2 weeks? ---No Does the PT have any chronic conditions? (i.e. diabetes, asthma, this includes High risk factors for pregnancy, etc.) ---No Is the patient pregnant or possibly pregnant? (Ask all females between the ages of 50-55) ---No Is this a behavioral health or substance abuse call? ---No Guidelines Guideline Title Affirmed Question Affirmed Notes Nurse Date/Time (Eastern Time) Heart Rate and Heartbeat Questions Difficulty breathing Raphael Gibney, RN, Vanita Ingles 08/16/2020 8:22:53 AM Disp. Time Eilene Ghazi Time) Disposition Final User 08/16/2020 8:26:25 AM Go to ED Now Yes Raphael Gibney, RN, Doreatha Lew Disagree/Comply Comply PLEASE NOTE: All timestamps contained within this report are represented as [place] Standard Time. CONFIDENTIALTY NOTICE: This fax transmission is intended only for the addressee. It contains information that is legally privileged, confidential or otherwise protected from use or disclosure. If you are not the intended recipient, you are strictly prohibited from reviewing, disclosing, copying using or disseminating any of this information or taking any action in reliance on or regarding this information. If you have received this fax in error, please notify us immediately by telephone so that we can arrange for its return to [place]. Phone: [phone], Toll-Free: [phone], Fax: [phone] Page: 2 of 2 Call Id:  [id number] [place] Understands Yes PreDisposition Call Doctor Care Advice Given Per Guideline GO TO ED NOW: * You need to be seen in the Emergency Department. ANOTHER ADULT SHOULD DRIVE: * It is better and safer if another adult drives instead of you. CARE ADVICE given per Heart Rate and Heartbeat Questions (Adult) guideline. Comments User: Dannielle Burn, RN Date/Time Eilene Ghazi Time): 08/16/2020 8:25:46 AM pt's spouse is at work and she states she will try to find a frined to watch her kids so she can go to the ER. Referrals GO TO FACILITY UNDECIDE

## 2020-08-18 ENCOUNTER — Other Ambulatory Visit: Payer: Self-pay | Admitting: Family Medicine

## 2020-08-18 NOTE — Telephone Encounter (Signed)
Pt was seen in the ED.  

## 2020-08-19 MED ORDER — TRAMADOL HCL 50 MG PO TABS: 50.0000 mg | ORAL_TABLET | Freq: Every evening | ORAL | 0 refills | Status: DC | PRN

## 2020-08-26 ENCOUNTER — Other Ambulatory Visit: Payer: Self-pay | Admitting: Family Medicine

## 2020-08-28 ENCOUNTER — Other Ambulatory Visit: Payer: Self-pay | Admitting: Family Medicine

## 2020-08-30 ENCOUNTER — Encounter: Payer: Self-pay | Admitting: Physician Assistant

## 2020-08-30 MED ORDER — TRAMADOL HCL 50 MG PO TABS: 50.0000 mg | ORAL_TABLET | Freq: Every evening | ORAL | 0 refills | Status: DC | PRN

## 2020-08-30 NOTE — Telephone Encounter (Signed)
[facility] pt 

## 2020-09-09 ENCOUNTER — Other Ambulatory Visit: Payer: Self-pay | Admitting: Family Medicine

## 2020-09-09 MED ORDER — TRAMADOL HCL 50 MG PO TABS: 50.0000 mg | ORAL_TABLET | Freq: Every evening | ORAL | 0 refills | Status: DC | PRN

## 2020-09-14 ENCOUNTER — Encounter: Payer: Self-pay | Admitting: Family Medicine

## 2020-09-15 ENCOUNTER — Encounter: Payer: Self-pay | Admitting: Family Medicine

## 2020-09-15 ENCOUNTER — Ambulatory Visit: Payer: Self-pay

## 2020-09-15 ENCOUNTER — Other Ambulatory Visit: Payer: Self-pay

## 2020-09-15 ENCOUNTER — Ambulatory Visit (INDEPENDENT_AMBULATORY_CARE_PROVIDER_SITE_OTHER): Payer: No Typology Code available for payment source | Admitting: Family Medicine

## 2020-09-15 DIAGNOSIS — M25511 Pain in right shoulder: Secondary | ICD-10-CM | POA: Diagnosis not present

## 2020-09-15 DIAGNOSIS — M25521 Pain in right elbow: Secondary | ICD-10-CM

## 2020-09-15 MED ORDER — HYDROCODONE-ACETAMINOPHEN 5-325 MG PO TABS: 1.0000 | ORAL_TABLET | Freq: Every evening | ORAL | 0 refills | Status: DC | PRN

## 2020-09-15 NOTE — Progress Notes (Signed)
Office Visit Note   Patient: [name] [name]           Date of Birth: [birth year]           MRN: [id number] Visit Date: 09/15/2020 Requested by: Inda Coke, [place],  [place] [id number] PCP: Inda Coke, [place]  Subjective: Chief Complaint  Patient presents with  . Right Shoulder - Pain    Slipped and fell on the right upper arm, while sledding with the kids, yesterday afternoon. Hand is swollen and hurts to grip. Decreased ROM due to pain.    HPI: She is here with right shoulder and elbow pain.  Yesterday she slipped and fell landing directly on her right side.  Pain in the shoulder when reaching overhead, the pain keeps her awake.  Pain in the elbow with swelling and stiffness.               ROS:   All other systems were reviewed and are negative.  Objective: Vital Signs: There were no vitals taken for this visit.  Physical Exam:  General:  Alert and oriented, in no acute distress. Pulm:  Breathing unlabored. Psy:  Normal mood, congruent affect. Skin: No skin breakdown or bruising Right shoulder: She has full active range of motion but quite a bit of pain trying to reach overhead.  Empty can test is painful but her strength is good.  Also good strength with internal and external rotation against resistance. Right elbow: 1+ effusion, very tender over the olecranon and over the radial head.  Full flexion and extension, full pronation and supination with pain at the extremes.    Imaging: XR Elbow Complete Right (3+View)  Result Date: 09/15/2020 Right elbow show normal anatomic alignment with no obvious fracture.  XR Shoulder Right  Result Date: 09/15/2020 X-rays of the right shoulder show anatomic alignment with no obvious fracture.  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   Assessment & Plan: 1.  1 day status post fall with right shoulder and elbow contusion, cannot rule out occult fracture -Range of motion to tolerance.  Shoulder sling if  needed.  Hydrocodone only for severe pain at night.  If pain persist, we will see her back for recheck.     Procedures: No procedures performed        PMFS History: Patient Active Problem List   Diagnosis Date Noted  . Thoracic outlet syndrome 10/01/2018  . Myofascial pain 09/04/2017  . Bilateral carpal tunnel syndrome 09/04/2017  . Ankle impingement syndrome, left 09/04/2017  . Migraine with aura and without status migrainosus, not intractable 09/06/2015  . Anxiety and depression 11/05/2009  . Attention deficit disorder 11/05/2009   Past Medical History:  Diagnosis Date  . ADHD (attention deficit hyperactivity disorder)   . Anxiety   . Depression   . Fibromyalgia   . Gallstones   . Gestational diabetes   . History of chicken pox   . History of condyloma acuminatum   . Migraines     Family History  Problem Relation Age of Onset  . Hypertension Mother   . Migraines Mother   . Hypertension Father   . Migraines Sister   . Heart disease Maternal Grandmother   . Heart disease Maternal Grandfather   . Diabetes Paternal Grandmother   . Prostate cancer Paternal Grandfather   . Esophageal cancer Paternal Grandfather   . Healthy Son   . Healthy Daughter   . Asthma Daughter   . Anesthesia problems Neg  Hx   . Hypotension Neg Hx   . Malignant hyperthermia Neg Hx   . Pseudochol deficiency Neg Hx   . Colon cancer Neg Hx     Past Surgical History:  Procedure Laterality Date  . CESAREAN SECTION  06/25/2011  . CESAREAN SECTION N/A 05/04/2015  . CHOLECYSTECTOMY N/A 07/07/2015  . ERCP N/A 08/24/2015  . KNEE CARTILAGE SURGERY Left    Social History   Occupational History  . Occupation: Doesn't work  Tobacco Use  . Smoking status: Current Some Day Smoker  . Smokeless tobacco: Never Used  . Tobacco comment: smokes socially (1 cigarette a week)  Vaping Use  . Vaping Use: Never used  Substance and Sexual Activity  . Alcohol use: No    Alcohol/week: 0.0 standard drinks   . Drug use: No  . Sexual activity: Yes

## 2020-09-17 ENCOUNTER — Ambulatory Visit: Payer: No Typology Code available for payment source | Admitting: Family Medicine

## 2020-09-19 ENCOUNTER — Other Ambulatory Visit: Payer: Self-pay | Admitting: Family Medicine

## 2020-09-20 MED ORDER — TRAMADOL HCL 50 MG PO TABS: 50.0000 mg | ORAL_TABLET | Freq: Every evening | ORAL | 0 refills | Status: DC | PRN

## 2020-09-20 NOTE — Telephone Encounter (Signed)
[facility] pt 

## 2020-09-29 ENCOUNTER — Other Ambulatory Visit: Payer: Self-pay | Admitting: Family Medicine

## 2020-09-29 MED ORDER — TRAMADOL HCL 50 MG PO TABS: 50.0000 mg | ORAL_TABLET | Freq: Every evening | ORAL | 0 refills | Status: DC | PRN

## 2020-09-29 NOTE — Telephone Encounter (Signed)
Please advise 

## 2020-10-08 ENCOUNTER — Other Ambulatory Visit: Payer: Self-pay | Admitting: Family Medicine

## 2020-10-08 MED ORDER — TRAMADOL HCL 50 MG PO TABS: 50.0000 mg | ORAL_TABLET | Freq: Every evening | ORAL | 0 refills | Status: DC | PRN

## 2020-10-08 NOTE — Telephone Encounter (Signed)
Please advise 

## 2020-10-08 NOTE — Telephone Encounter (Signed)
[name] pt

## 2020-10-17 ENCOUNTER — Other Ambulatory Visit: Payer: Self-pay | Admitting: Family Medicine

## 2020-10-18 ENCOUNTER — Other Ambulatory Visit: Payer: Self-pay | Admitting: Family Medicine

## 2020-10-18 MED ORDER — TRAMADOL HCL 50 MG PO TABS: 50.0000 mg | ORAL_TABLET | Freq: Every evening | ORAL | 0 refills | Status: DC | PRN

## 2020-10-20 ENCOUNTER — Encounter: Payer: Self-pay | Admitting: Family Medicine

## 2020-10-21 ENCOUNTER — Other Ambulatory Visit: Payer: Self-pay | Admitting: Physician Assistant

## 2020-10-21 ENCOUNTER — Other Ambulatory Visit: Payer: Self-pay | Admitting: Family Medicine

## 2020-10-21 DIAGNOSIS — R110 Nausea: Secondary | ICD-10-CM

## 2020-10-21 DIAGNOSIS — M542 Cervicalgia: Secondary | ICD-10-CM

## 2020-10-21 MED ORDER — METHYLPREDNISOLONE 4 MG PO TBPK: ORAL_TABLET | ORAL | 0 refills | Status: DC

## 2020-10-22 MED ORDER — CYCLOBENZAPRINE HCL 5 MG PO TABS: 5.0000 mg | ORAL_TABLET | Freq: Every day | ORAL | 0 refills | Status: DC | PRN

## 2020-10-22 MED ORDER — ONDANSETRON HCL 4 MG PO TABS: 4.0000 mg | ORAL_TABLET | Freq: Three times a day (TID) | ORAL | 0 refills | Status: DC | PRN

## 2020-10-27 ENCOUNTER — Other Ambulatory Visit: Payer: Self-pay | Admitting: Family Medicine

## 2020-10-28 MED ORDER — TRAMADOL HCL 50 MG PO TABS: 50.0000 mg | ORAL_TABLET | Freq: Every evening | ORAL | 0 refills | Status: DC | PRN

## 2020-10-28 NOTE — Telephone Encounter (Signed)
Please advise. °

## 2020-11-08 ENCOUNTER — Other Ambulatory Visit: Payer: Self-pay | Admitting: Family Medicine

## 2020-11-08 MED ORDER — TRAMADOL HCL 50 MG PO TABS: 50.0000 mg | ORAL_TABLET | Freq: Every evening | ORAL | 0 refills | Status: DC | PRN

## 2020-11-16 ENCOUNTER — Other Ambulatory Visit: Payer: Self-pay | Admitting: Family Medicine

## 2020-11-17 MED ORDER — TRAMADOL HCL 50 MG PO TABS: 50.0000 mg | ORAL_TABLET | Freq: Every evening | ORAL | 0 refills | Status: DC | PRN

## 2020-11-28 ENCOUNTER — Other Ambulatory Visit: Payer: Self-pay | Admitting: Family Medicine

## 2020-11-29 ENCOUNTER — Encounter: Payer: Self-pay | Admitting: Family Medicine

## 2020-11-29 MED ORDER — TRAMADOL HCL 50 MG PO TABS
50.0000 mg | ORAL_TABLET | Freq: Every evening | ORAL | 0 refills | Status: DC | PRN
Start: 2020-11-29 — End: 2021-04-04

## 2020-11-30 ENCOUNTER — Other Ambulatory Visit: Payer: Self-pay | Admitting: Physician Assistant

## 2020-11-30 DIAGNOSIS — R110 Nausea: Secondary | ICD-10-CM

## 2020-11-30 MED ORDER — ONDANSETRON HCL 4 MG PO TABS: 4.0000 mg | ORAL_TABLET | Freq: Three times a day (TID) | ORAL | 0 refills | Status: DC | PRN

## 2020-12-08 ENCOUNTER — Other Ambulatory Visit: Payer: Self-pay | Admitting: Family Medicine

## 2020-12-13 ENCOUNTER — Other Ambulatory Visit: Payer: Self-pay | Admitting: Family Medicine

## 2021-01-12 ENCOUNTER — Ambulatory Visit (INDEPENDENT_AMBULATORY_CARE_PROVIDER_SITE_OTHER): Payer: No Typology Code available for payment source | Admitting: Family Medicine

## 2021-01-12 ENCOUNTER — Encounter: Payer: Self-pay | Admitting: Family Medicine

## 2021-01-12 ENCOUNTER — Other Ambulatory Visit: Payer: Self-pay

## 2021-01-12 DIAGNOSIS — M65342 Trigger finger, left ring finger: Secondary | ICD-10-CM

## 2021-01-12 NOTE — Progress Notes (Addendum)
Office Visit Note   Patient: [name] [name]           Date of Birth: 1981-06-05           MRN: [id number] Visit Date: 01/12/2021 Requested by: Inda Coke, [place],  [place] [id number] PCP: Inda Coke, [place]  Subjective: Chief Complaint  Patient presents with  . Left Ring Finger - Pain    Finger gets stuck and hurts x a month. Right-hand dominant. Numbness in both hands.    HPI: She is here with bilateral hand pain.  Symptoms started for about a month, but in the past week her left ring finger has been triggering.  She has a history of carpal tunnel syndrome causing numbness and tingling in her hands.  She has a history of intermittent swelling in her extremities with no apparent cause.  She has had rheumatologic work-up which was negative.              ROS:   All other systems were reviewed and are negative.  Objective: Vital Signs: There were no vitals taken for this visit.  Physical Exam:  General:  Alert and oriented, in no acute distress. Pulm:  Breathing unlabored. Psy:  Normal mood, congruent affect. Skin: No erythema Hands: She has tender nodules at the A1 pulleys of multiple fingers but the left fourth finger triggers in flexion.  Imaging: No results found.  Assessment & Plan: 1.  Bilateral hand swelling with left fourth trigger finger, and probably right 3rd as well. -We discussed options and elected to try splinting for the left hand fourth finger and right 3rd.  If symptoms persist we could inject with cortisone.  Her schedule is too busy for physical therapy.     Procedures: No procedures performed        PMFS History: Patient Active Problem List   Diagnosis Date Noted  . Thoracic outlet syndrome 10/01/2018  . Myofascial pain 09/04/2017  . Bilateral carpal tunnel syndrome 09/04/2017  . Ankle impingement syndrome, left 09/04/2017  . Migraine with aura and without status migrainosus, not intractable 09/06/2015  . Anxiety  and depression 11/05/2009  . Attention deficit disorder 11/05/2009   Past Medical History:  Diagnosis Date  . ADHD (attention deficit hyperactivity disorder)   . Anxiety   . Depression   . Fibromyalgia   . Gallstones   . Gestational diabetes   . History of chicken pox   . History of condyloma acuminatum   . Migraines     Family History  Problem Relation Age of Onset  . Hypertension Mother   . Migraines Mother   . Hypertension Father   . Migraines Sister   . Heart disease Maternal Grandmother   . Heart disease Maternal Grandfather   . Diabetes Paternal Grandmother   . Prostate cancer Paternal Grandfather   . Esophageal cancer Paternal Grandfather   . Healthy Son   . Healthy Daughter   . Asthma Daughter   . Anesthesia problems Neg Hx   . Hypotension Neg Hx   . Malignant hyperthermia Neg Hx   . Pseudochol deficiency Neg Hx   . Colon cancer Neg Hx     Past Surgical History:  Procedure Laterality Date  . CESAREAN SECTION  06/25/2011  . CESAREAN SECTION N/A 05/04/2015  . CHOLECYSTECTOMY N/A 07/07/2015  . ERCP N/A 08/24/2015  . KNEE CARTILAGE SURGERY Left    Social History   Occupational History  . Occupation: Doesn't work  Tobacco Use  .  Smoking status: Current Some Day Smoker  . Smokeless tobacco: Never Used  . Tobacco comment: smokes socially (1 cigarette a week)  Vaping Use  . Vaping Use: Never used  Substance and Sexual Activity  . Alcohol use: No    Alcohol/week: 0.0 standard drinks  . Drug use: No  . Sexual activity: Yes

## 2021-01-14 ENCOUNTER — Encounter: Payer: Self-pay | Admitting: Family Medicine

## 2021-01-14 ENCOUNTER — Other Ambulatory Visit: Payer: Self-pay

## 2021-01-14 ENCOUNTER — Ambulatory Visit (INDEPENDENT_AMBULATORY_CARE_PROVIDER_SITE_OTHER): Payer: No Typology Code available for payment source | Admitting: Family Medicine

## 2021-01-14 DIAGNOSIS — M65342 Trigger finger, left ring finger: Secondary | ICD-10-CM

## 2021-01-14 NOTE — Progress Notes (Signed)
Subjective: She is here with worsening left hand pain.  Pain primarily in the ring finger.  The right hand feels much better today.  Objective: She is tender at the A1 pulley of the left ring finger.  No active triggering this afternoon.  Procedure: Left hand injection: After sterile prep with Betadine, injected 2 cc 1% lidocaine without epinephrine and 6 mg betamethasone into the region of the ring finger A1 pulley.  Impression: Left fourth trigger finger  Plan: Injection given as above.  Follow-up as needed.

## 2021-01-16 ENCOUNTER — Encounter: Payer: Self-pay | Admitting: Physician Assistant

## 2021-01-16 DIAGNOSIS — R110 Nausea: Secondary | ICD-10-CM

## 2021-01-17 MED ORDER — ONDANSETRON HCL 4 MG PO TABS: 4.0000 mg | ORAL_TABLET | Freq: Three times a day (TID) | ORAL | 0 refills | Status: DC | PRN

## 2021-01-17 NOTE — Telephone Encounter (Signed)
Dr. Jerline Pain, pt would like a Rx for Scopolamine patches for deep sea fishing. Okay to fill?

## 2021-02-24 ENCOUNTER — Other Ambulatory Visit: Payer: Self-pay | Admitting: Physician Assistant

## 2021-02-24 ENCOUNTER — Encounter: Payer: Self-pay | Admitting: Physician Assistant

## 2021-03-31 ENCOUNTER — Encounter: Payer: Self-pay | Admitting: Family Medicine

## 2021-03-31 ENCOUNTER — Other Ambulatory Visit: Payer: Self-pay | Admitting: Family Medicine

## 2021-04-04 ENCOUNTER — Encounter: Payer: Self-pay | Admitting: Family Medicine

## 2021-04-04 ENCOUNTER — Other Ambulatory Visit: Payer: Self-pay

## 2021-04-04 ENCOUNTER — Ambulatory Visit (INDEPENDENT_AMBULATORY_CARE_PROVIDER_SITE_OTHER): Payer: No Typology Code available for payment source | Admitting: Family Medicine

## 2021-04-04 DIAGNOSIS — M65342 Trigger finger, left ring finger: Secondary | ICD-10-CM

## 2021-04-04 NOTE — Progress Notes (Signed)
Office Visit Note   Patient: [name] [name]           Date of Birth: August 16, 1981           MRN: [id number] Visit Date: 04/04/2021 Requested by: Inda Coke, [place],  [place] [id number] PCP: Inda Coke, [place]  Subjective: Chief Complaint  Patient presents with   Left Hand - Pain    Trigger finger, left ring finger. The cortisone injection helped x 2 months. The cortisone helped the other hand, as well. Requests refill of gabapentin.    HPI: She is here with recurrent left fourth trigger finger.  Symptoms resolved for a while after injection but recently it has started to catch again.  Her other fingers also felt better after the injection.  She would like to try it again.              ROS:   All other systems were reviewed and are negative.  Objective: Vital Signs: There were no vitals taken for this visit.  Physical Exam:  General:  Alert and oriented, in no acute distress. Pulm:  Breathing unlabored. Psy:  Normal mood, congruent affect.  Left hand: She has a tender nodule at the fourth finger A1 pulley.  It does not trigger in flexion today but flexion does hurt.    Imaging: No results found.  Assessment & Plan: Left fourth trigger finger -Repeat injection today.  Follow-up as needed.     Procedures: Left hand injection: After sterile prep with Betadine, injected 1 cc 1% lidocaine without epinephrine and 40 mg Depo-Medrol into the region of the A1 pulley of the fourth finger.       PMFS History: Patient Active Problem List   Diagnosis Date Noted   Thoracic outlet syndrome 10/01/2018   Myofascial pain 09/04/2017   Bilateral carpal tunnel syndrome 09/04/2017   Ankle impingement syndrome, left 09/04/2017   Migraine with aura and without status migrainosus, not intractable 09/06/2015   Anxiety and depression 11/05/2009   Attention deficit disorder 11/05/2009   Past Medical History:  Diagnosis Date   ADHD (attention deficit  hyperactivity disorder)    Anxiety    Depression    Fibromyalgia    Gallstones    Gestational diabetes    History of chicken pox    History of condyloma acuminatum    Migraines     Family History  Problem Relation Age of Onset   Hypertension Mother    Migraines Mother    Hypertension Father    Migraines Sister    Heart disease Maternal Grandmother    Heart disease Maternal Grandfather    Diabetes Paternal Grandmother    Prostate cancer Paternal Grandfather    Esophageal cancer Paternal Grandfather    Healthy Son    Healthy Daughter    Asthma Daughter    Anesthesia problems Neg Hx    Hypotension Neg Hx    Malignant hyperthermia Neg Hx    Pseudochol deficiency Neg Hx    Colon cancer Neg Hx     Past Surgical History:  Procedure Laterality Date   CESAREAN SECTION  06/25/2011   CESAREAN SECTION N/A 05/04/2015   CHOLECYSTECTOMY N/A 07/07/2015   ERCP N/A 08/24/2015   KNEE CARTILAGE SURGERY Left    Social History   Occupational History   Occupation: Doesn't work  Tobacco Use   Smoking status: Some Days   Smokeless tobacco: Never   Tobacco comments:    smokes socially (1 cigarette a  week)  Vaping Use   Vaping Use: Never used  Substance and Sexual Activity   Alcohol use: No    Alcohol/week: 0.0 standard drinks   Drug use: No   Sexual activity: Yes

## 2021-04-07 ENCOUNTER — Encounter: Payer: Self-pay | Admitting: Physician Assistant

## 2021-04-08 ENCOUNTER — Other Ambulatory Visit: Payer: Self-pay | Admitting: Family Medicine

## 2021-04-08 MED ORDER — DULOXETINE HCL 60 MG PO CPEP: ORAL_CAPSULE | ORAL | 0 refills | Status: DC

## 2021-04-11 MED ORDER — DULOXETINE HCL 60 MG PO CPEP: ORAL_CAPSULE | ORAL | 0 refills | Status: DC

## 2021-04-11 NOTE — Addendum Note (Signed)
Addended by: Marian Sorrow on: 04/11/2021 02:04 PM   Modules accepted: Orders

## 2021-04-15 ENCOUNTER — Encounter: Payer: Self-pay | Admitting: Physician Assistant

## 2021-04-15 ENCOUNTER — Other Ambulatory Visit: Payer: Self-pay

## 2021-04-15 ENCOUNTER — Ambulatory Visit (INDEPENDENT_AMBULATORY_CARE_PROVIDER_SITE_OTHER): Payer: No Typology Code available for payment source | Admitting: Physician Assistant

## 2021-04-15 VITALS — BP 112/75 | HR 73 | Temp 97.9°F | Ht 66.0 in | Wt 167.2 lb

## 2021-04-15 DIAGNOSIS — R6882 Decreased libido: Secondary | ICD-10-CM

## 2021-04-15 DIAGNOSIS — G43109 Migraine with aura, not intractable, without status migrainosus: Secondary | ICD-10-CM

## 2021-04-15 DIAGNOSIS — F419 Anxiety disorder, unspecified: Secondary | ICD-10-CM

## 2021-04-15 DIAGNOSIS — Z8632 Personal history of gestational diabetes: Secondary | ICD-10-CM | POA: Diagnosis not present

## 2021-04-15 DIAGNOSIS — Z0001 Encounter for general adult medical examination with abnormal findings: Secondary | ICD-10-CM | POA: Diagnosis not present

## 2021-04-15 DIAGNOSIS — Z13220 Encounter for screening for lipoid disorders: Secondary | ICD-10-CM | POA: Diagnosis not present

## 2021-04-15 DIAGNOSIS — F32A Depression, unspecified: Secondary | ICD-10-CM

## 2021-04-15 DIAGNOSIS — R110 Nausea: Secondary | ICD-10-CM

## 2021-04-15 LAB — COMPREHENSIVE METABOLIC PANEL
ALT: 11 U/L (ref 0–35)
AST: 16 U/L (ref 0–37)
Albumin: 4.4 g/dL (ref 3.5–5.2)
Alkaline Phosphatase: 49 U/L (ref 39–117)
BUN: 9 mg/dL (ref 6–23)
CO2: 28 mEq/L (ref 19–32)
Calcium: 9.1 mg/dL (ref 8.4–10.5)
Chloride: 104 mEq/L (ref 96–112)
Creatinine, Ser: 0.7 mg/dL (ref 0.40–1.20)
GFR: 108.74 mL/min (ref >60.00)
Glucose, Bld: 108 mg/dL — ABNORMAL HIGH (ref 70–99)
Potassium: 3.7 mEq/L (ref 3.5–5.1)
Sodium: 139 mEq/L (ref 135–145)
Total Bilirubin: 0.5 mg/dL (ref 0.2–1.2)
Total Protein: 7.1 g/dL (ref 6.0–8.3)

## 2021-04-15 LAB — CBC WITH DIFFERENTIAL/PLATELET
Basophils Absolute: 0 10*3/uL (ref 0.0–0.1)
Basophils Relative: 0.4 % (ref 0.0–3.0)
Eosinophils Absolute: 0.3 10*3/uL (ref 0.0–0.7)
Eosinophils Relative: 4.9 % (ref 0.0–5.0)
HCT: 42.1 % (ref 36.0–46.0)
Hemoglobin: 14.3 g/dL (ref 12.0–15.0)
Lymphocytes Relative: 30.2 % (ref 12.0–46.0)
Lymphs Abs: 1.8 10*3/uL (ref 0.7–4.0)
MCHC: 33.9 g/dL (ref 30.0–36.0)
MCV: 90.2 fl (ref 78.0–100.0)
Monocytes Absolute: 0.3 10*3/uL (ref 0.1–1.0)
Monocytes Relative: 4.3 % (ref 3.0–12.0)
Neutro Abs: 3.7 10*3/uL (ref 1.4–7.7)
Neutrophils Relative %: 60.2 % (ref 43.0–77.0)
Platelets: 246 10*3/uL (ref 150.0–400.0)
RBC: 4.67 Mil/uL (ref 3.87–5.11)
RDW: 13.6 % (ref 11.5–15.5)
WBC: 6.1 10*3/uL (ref 4.0–10.5)

## 2021-04-15 LAB — LIPID PANEL
Cholesterol: 203 mg/dL — ABNORMAL HIGH (ref 0–200)
HDL: 54.2 mg/dL (ref >39.00)
LDL Cholesterol: 136 mg/dL — ABNORMAL HIGH (ref 0–99)
NonHDL: 148.91
Total CHOL/HDL Ratio: 4
Triglycerides: 64 mg/dL (ref 0.0–149.0)
VLDL: 12.8 mg/dL (ref 0.0–40.0)

## 2021-04-15 LAB — HEMOGLOBIN A1C: Hgb A1c MFr Bld: 5.5 % (ref 4.6–6.5)

## 2021-04-15 MED ORDER — BUTALBITAL-APAP-CAFFEINE 50-325-40 MG PO TABS: 1.0000 | ORAL_TABLET | Freq: Four times a day (QID) | ORAL | 3 refills | Status: DC | PRN

## 2021-04-15 MED ORDER — ALBUTEROL SULFATE HFA 108 (90 BASE) MCG/ACT IN AERS: INHALATION_SPRAY | RESPIRATORY_TRACT | 2 refills | Status: DC

## 2021-04-15 MED ORDER — ONDANSETRON HCL 4 MG PO TABS
4.0000 mg | ORAL_TABLET | Freq: Three times a day (TID) | ORAL | 0 refills | Status: DC | PRN
Start: 2021-04-15 — End: 2021-05-19

## 2021-04-15 NOTE — Progress Notes (Signed)
Subjective:    [name] [name] is a 40 y.o. female and is here for a comprehensive physical exam.  HPI  Health Maintenance Due  Topic Date Due   COVID-19 Vaccine (1) Never done   Pneumococcal Vaccine 90-32 Years old (1 - PCV) Never done   PAP SMEAR-Modifier  10/14/2018   INFLUENZA VACCINE  03/28/2021    Acute Concerns: Decreased libido --patient reports that she is having some issues with her libido.  States that she thinks it could be a result of patient is going on with her uncontrolled anxiety/depression related to relationship with her mother.  She also thinks that it could be a result of her medication, currently on Cymbalta 120 mg daily.   Chronic Issues: Nausea --chronic issue for her.  Needs a refill on Zofran.  Does occasionally get heartburn but this is relieved when she has a bowel movement.  Denies vomiting or diarrhea. Migraines --overall pretty stable with Tylenol arthritis.  She does like to have Fioricet on hand for very rare use.  Denies any new migraine symptoms at this time. Anxiety and depression --currently taking Cymbalta 120 mg daily and this is working very well for her she has no desire to decrease medication at this time. History of gestational diabetes --she reports that she has a history of gestational diabetes with all her children.  She states that she does enjoy sweet beverages throughout the day, including Coke and sweet tea.  Denies polyphagia, polydipsia, polyuria.  Health Maintenance: Immunizations --has not had COVID vaccination Colonoscopy --N/A Mammogram --will be due on her 2th birthday in December, counseled PAP -- overdue  Diet -- eats overall okay; drinks coke and sweet tea Caffeine intake -- coke and sweet tea Sleep habits --overall okay, gabapentin helps Exercise -- no scheduled exercise Current Weight -- Weight: 167 lb 3.2 oz (75.8 kg)  Weight History: Wt Readings from Last 10 Encounters:  04/15/21 167 lb 3.2 oz (75.8 kg)  08/16/20  162 lb (73.5 kg)  01/09/20 166 lb (75.3 kg)  09/22/19 162 lb (73.5 kg)  09/09/19 166 lb (75.3 kg)  04/23/19 157 lb (71.2 kg)  12/31/18 171 lb (77.6 kg)  11/22/18 168 lb 6.4 oz (76.4 kg)  10/29/18 171 lb 12.8 oz (77.9 kg)  10/01/18 163 lb (73.9 kg)   Body mass index is 26.99 kg/m. Mood --well-controlled  No LMP recorded. (Menstrual status: IUD). Period characteristics --does not have a period with her IUD Birth control --IUD    reports no history of alcohol use.  Tobacco Use: High Risk   Smoking Tobacco Use: Some Days   Smokeless Tobacco Use: Never     Depression screen [facility] 2/9 04/01/2018  Decreased Interest 0  Down, Depressed, Hopeless 0  PHQ - 2 Score 0  Altered sleeping 3  Tired, decreased energy 0  Change in appetite 0  Feeling bad or failure about yourself  0  Trouble concentrating 3  Moving slowly or fidgety/restless 0  Suicidal thoughts 0  PHQ-9 Score 6  Difficult doing work/chores Not difficult at all  Some recent data might be hidden     Other providers/specialists: Patient Care Team: Inda Coke, [place] as PCP - General (Physician Assistant) Servando Salina, MD as Consulting Physician (Obstetrics and Gynecology) Chucky May, MD as Consulting Physician (Psychiatry)   PMHx, SurgHx, SocialHx, Medications, and Allergies were reviewed in the Visit Navigator and updated as appropriate.   Past Medical History:  Diagnosis Date   ADHD (attention deficit hyperactivity disorder)  Anxiety    Depression    Fibromyalgia    Gallstones    Gestational diabetes    History of chicken pox    History of condyloma acuminatum    Migraines      Past Surgical History:  Procedure Laterality Date   CESAREAN SECTION  06/25/2011   CESAREAN SECTION N/A 05/04/2015   CHOLECYSTECTOMY N/A 07/07/2015   ERCP N/A 08/24/2015   KNEE CARTILAGE SURGERY Left      Family History  Problem Relation Age of Onset   Hypertension Mother    Migraines Mother    Skin cancer  Mother        basal/squamous   Hypertension Father    Migraines Sister    Heart disease Maternal Grandmother    Heart disease Maternal Grandfather    Diabetes Paternal Grandmother    Prostate cancer Paternal Grandfather    Esophageal cancer Paternal Grandfather    Healthy Daughter    Asthma Daughter    Healthy Son    Anesthesia problems Neg Hx    Hypotension Neg Hx    Malignant hyperthermia Neg Hx    Pseudochol deficiency Neg Hx    Colon cancer Neg Hx     Social History   Tobacco Use   Smoking status: Some Days   Smokeless tobacco: Never   Tobacco comments:    smokes socially (1 cigarette a week)  Vaping Use   Vaping Use: Never used  Substance Use Topics   Alcohol use: No    Alcohol/week: 0.0 standard drinks   Drug use: No    Review of Systems:   Review of Systems  Constitutional:  Negative for chills, fever, malaise/fatigue and weight loss.  HENT:  Negative for hearing loss, sinus pain and sore throat.   Respiratory:  Negative for cough and hemoptysis.   Cardiovascular:  Negative for chest pain, palpitations, leg swelling and PND.  Gastrointestinal:  Negative for abdominal pain, constipation, diarrhea, heartburn, nausea and vomiting.  Genitourinary:  Negative for dysuria, frequency and urgency.  Musculoskeletal:  Negative for back pain, myalgias and neck pain.  Skin:  Negative for itching and rash.  Neurological:  Negative for dizziness, tingling, seizures and headaches.  Endo/Heme/Allergies:  Negative for polydipsia.  Psychiatric/Behavioral:  Negative for depression. The patient is not nervous/anxious.    Objective:   BP 112/75   Pulse 73   Temp 97.9 F (36.6 C) (Temporal)   Ht '5\' 6"'$  (1.676 m)   Wt 167 lb 3.2 oz (75.8 kg)   SpO2 98%   BMI 26.99 kg/m   General Appearance:    Alert, cooperative, no distress, appears stated age  Head:    Normocephalic, without obvious abnormality, atraumatic  Eyes:    PERRL, conjunctiva/corneas clear, EOM's intact, fundi     benign, both eyes  Ears:    Normal TM's and external ear canals, both ears  Nose:   Nares normal, septum midline, mucosa normal, no drainage    or sinus tenderness  Throat:   Lips, mucosa, and tongue normal; teeth and gums normal  Neck:   Supple, symmetrical, trachea midline, no adenopathy;    thyroid:  no enlargement/tenderness/nodules; no carotid   bruit or JVD  Back:     Symmetric, no curvature, ROM normal, no CVA tenderness  Lungs:     Clear to auscultation bilaterally, respirations unlabored  Chest Wall:    No tenderness or deformity   Heart:    Regular rate and rhythm, S1 and S2 normal, no murmur,  rub   or gallop  Breast Exam:    Deferred  Abdomen:     Soft, non-tender, bowel sounds active all four quadrants,    no masses, no organomegaly  Genitalia:    Deferred  Rectal:    Deferred  Extremities:   Extremities normal, atraumatic, no cyanosis or edema  Pulses:   2+ and symmetric all extremities  Skin:   Skin color, texture, turgor normal, no rashes or lesions  Lymph nodes:   Cervical, supraclavicular, and axillary nodes normal  Neurologic:   CNII-XII intact, normal strength, sensation and reflexes    throughout    Assessment/Plan:   [name] [name] was seen today for annual exam.  Diagnoses and all orders for this visit:  Encounter for general adult medical examination with abnormal findings Today patient counseled on age appropriate routine health concerns for screening and prevention, each reviewed and up to date or declined. Immunizations reviewed and up to date or declined. Labs ordered and reviewed. Risk factors for depression reviewed and negative. Hearing function and visual acuity are intact. ADLs screened and addressed as needed. Functional ability and level of safety reviewed and appropriate. Education, counseling and referrals performed based on assessed risks today. Patient provided with a copy of personalized plan for preventive services.  Nausea Chronic issue for  patient Recommended that she take over-the-counter antacid medication such as Prilosec or Pepcid to help mitigate this. Continue Zofran as needed If still unsuccessful at relief of symptoms, follow-up with [place]. No red flag symptoms Comments: . Orders: -     ondansetron (ZOFRAN) 4 MG tablet; Take 1 tablet (4 mg total) by mouth every 8 (eight) hours as needed for nausea or vomiting. -     CBC with Differential/Platelet -     Comprehensive metabolic panel  History of gestational diabetes Discouraged ongoing excessive concentrated sweets Continue to work on healthy lifestyle Update A1c today -     Hemoglobin A1c  Migraine with aura and without status migrainosus, not intractable Overall well controlled but would like a refill of Fioricet Continue to use Tylenol arthritis as needed as first-line Follow-up as needed, no red flag symptoms today  Lipid screening -     Lipid panel  Anxiety and depression Patient reports that she is stable on her medication of Cymbalta 120 mg daily, continue this Would like a referral for talk therapy, this has been placed I discussed with patient that if they develop any SI, to tell someone immediately and seek medical attention. Follow-up in 6 months, sooner if concerns -     Ambulatory referral to Psychology  Low libido Possibly related to medications but she also thinks that it is related to her ongoing situational anxiety depression Handout provided Talk therapy referral placed  Other orders -     butalbital-acetaminophen-caffeine (FIORICET) 50-325-40 MG tablet; Take 1 tablet by mouth every 6 (six) hours as needed for headache. -     albuterol (VENTOLIN HFA) 108 (90 Base) MCG/ACT inhaler; TAKE 2 PUFFS BY MOUTH EVERY 6 HOURS AS NEEDED FOR WHEEZE OR SHORTNESS OF BREATH    Patient Counseling:   '[x]'$     Nutrition: Stressed importance of moderation in sodium/caffeine intake, saturated fat and cholesterol, caloric balance, sufficient intake of  fresh fruits, vegetables, fiber, calcium, iron, and 1 mg of folate supplement per day (for females capable of pregnancy).   '[x]'$      Stressed the importance of regular exercise.    '[x]'$     Substance Abuse: Discussed cessation/primary prevention of  tobacco, alcohol, or other drug use; driving or other dangerous activities under the influence; availability of treatment for abuse.    '[x]'$      Injury prevention: Discussed safety belts, safety helmets, smoke detector, smoking near bedding or upholstery.    '[x]'$      Sexuality: Discussed sexually transmitted diseases, partner selection, use of condoms, avoidance of unintended pregnancy  and contraceptive alternatives.    '[x]'$     Dental health: Discussed importance of regular tooth brushing, flossing, and dental visits.   '[x]'$      Health maintenance and immunizations reviewed. Please refer to Health maintenance section.   CMA or LPN served as scribe during this visit. History, Physical, and Plan performed by medical provider. The above documentation has been reviewed and is accurate and complete.   Inda Coke, PA-C [place]

## 2021-04-15 NOTE — Patient Instructions (Signed)
It was great to see you!  *Self schedule mammogram after you turn 40 in December *You will be contacted about your therapy appointment *Update your pap smear with your ob-gyn *Start over the counter acid medication to help with nausea also, if it doesn't improve, come see me  Please go to the lab for blood work.   Our office will call you with your results unless you have chosen to receive results via MyChart.  If your blood work is normal we will follow-up each year for physicals and as scheduled for chronic medical problems.  If anything is abnormal we will treat accordingly and get you in for a follow-up.  Take care,  Aldona Bar

## 2021-05-09 ENCOUNTER — Encounter: Payer: Self-pay | Admitting: Physician Assistant

## 2021-05-09 DIAGNOSIS — M25511 Pain in right shoulder: Secondary | ICD-10-CM

## 2021-05-09 DIAGNOSIS — G8929 Other chronic pain: Secondary | ICD-10-CM

## 2021-05-18 ENCOUNTER — Other Ambulatory Visit: Payer: Self-pay | Admitting: Physician Assistant

## 2021-05-18 DIAGNOSIS — R110 Nausea: Secondary | ICD-10-CM

## 2021-05-19 ENCOUNTER — Other Ambulatory Visit: Payer: Self-pay | Admitting: Physician Assistant

## 2021-05-19 DIAGNOSIS — R110 Nausea: Secondary | ICD-10-CM

## 2021-05-19 MED ORDER — ONDANSETRON HCL 4 MG PO TABS: 4.0000 mg | ORAL_TABLET | Freq: Three times a day (TID) | ORAL | 0 refills | Status: DC | PRN

## 2021-05-19 MED ORDER — DULOXETINE HCL 60 MG PO CPEP: ORAL_CAPSULE | ORAL | 0 refills | Status: DC

## 2021-06-21 ENCOUNTER — Ambulatory Visit: Payer: No Typology Code available for payment source | Admitting: Psychology

## 2021-06-21 DIAGNOSIS — F419 Anxiety disorder, unspecified: Secondary | ICD-10-CM

## 2021-06-28 ENCOUNTER — Telehealth (INDEPENDENT_AMBULATORY_CARE_PROVIDER_SITE_OTHER): Payer: No Typology Code available for payment source | Admitting: Family Medicine

## 2021-06-28 ENCOUNTER — Encounter: Payer: Self-pay | Admitting: Family Medicine

## 2021-06-28 DIAGNOSIS — J111 Influenza due to unidentified influenza virus with other respiratory manifestations: Secondary | ICD-10-CM | POA: Diagnosis not present

## 2021-06-28 NOTE — Progress Notes (Signed)
Virtual Visit via Video Note  I connected with [name] [name]  on 06/28/21 at  6:00 PM EDT by a video enabled telemedicine application and verified that I am speaking with the correct person using two identifiers.  Location patient: home,  Location provider:work or home office Persons participating in the virtual visit: patient, provider  I discussed the limitations of evaluation and management by telemedicine and the availability of in person appointments. The patient expressed understanding and agreed to proceed.   HPI:  Acute telemedicine visit for nasal congestion/cough: -Onset: 2 days -negative covid test this morning -Symptoms include: nasal congestion, congestion, body aches, fatigue, subjective fevers initially -none now -friends have flu and she has been around them - just found out -Denies:CP, SOB, NVD able to get up and down out of bed -drinking fluids -Pertinent past medical history: see below -Pertinent medication allergies:  Allergies  Allergen Reactions   Ibuprofen Hives and Other (See Comments)    stuffiness   Toradol [Ketorolac Tromethamine] Hives   Aspirin Swelling   Carafate [Sucralfate] Itching   Clindamycin Hives and Itching   Food     Nuts and Yeast   Latex Hives   Lovenox [Enoxaparin Sodium] Other (See Comments)    Face flush and redness   Mitigare [Colchicine]    Nsaids Hives   Other Hives    CATS   Penicillins Hives   Tizanidine Itching   Yellow Dyes (Non-Tartrazine) Hives    YELLOW No. 5   Zanaflex [Tizanidine Hcl] Rash  -COVID-19 vaccine status:  Immunization History  Administered Date(s) Administered   Influenza Whole 06/28/2009   Influenza,inj,Quad PF,6+ Mos 05/06/2015, 05/28/2017, 04/23/2018, 07/01/2019   Influenza-Unspecified 05/28/2017, 04/23/2018, 07/03/2019   Td 11/05/2009   Tdap 06/26/2011, 03/08/2015     ROS: See pertinent positives and negatives per HPI.  Past Medical History:  Diagnosis Date   ADHD (attention deficit  hyperactivity disorder)    Anxiety    Depression    Fibromyalgia    Gallstones    Gestational diabetes    History of chicken pox    History of condyloma acuminatum    Migraines     Past Surgical History:  Procedure Laterality Date   CESAREAN SECTION  06/25/2011   CESAREAN SECTION N/A 05/04/2015   CHOLECYSTECTOMY N/A 07/07/2015   ERCP N/A 08/24/2015   KNEE CARTILAGE SURGERY Left      Current Outpatient Medications:    albuterol (VENTOLIN HFA) 108 (90 Base) MCG/ACT inhaler, TAKE 2 PUFFS BY MOUTH EVERY 6 HOURS AS NEEDED FOR WHEEZE OR SHORTNESS OF BREATH, Disp: 18 g, Rfl: 2   ALPRAZolam (XANAX) 0.5 MG tablet, Take 0.5 mg by mouth 4 (four) times daily as needed for anxiety. , Disp: , Rfl: 5   amphetamine-dextroamphetamine (ADDERALL) 20 MG tablet, Take 20 mg by mouth 3 (three) times daily., Disp: , Rfl:    butalbital-acetaminophen-caffeine (FIORICET) 50-325-40 MG tablet, Take 1 tablet by mouth every 6 (six) hours as needed for headache., Disp: 20 tablet, Rfl: 3   cetirizine (ZYRTEC) 10 MG tablet, Take 10 mg by mouth daily., Disp: , Rfl:    DULoxetine (CYMBALTA) 60 MG capsule, TAKE 2 CAPSULE BY MOUTH EVERY DAY, Disp: 60 capsule, Rfl: 0   gabapentin (NEURONTIN) 100 MG capsule, TAKE 1 CAPSULE MOUTH AT BEDTIME , MAY INCREASE TO 1 BY MOUTH 3 TIMES A DAY IF NEEDED, Disp: 90 capsule, Rfl: 3   ondansetron (ZOFRAN) 4 MG tablet, Take 1 tablet (4 mg total) by mouth every 8 (eight) hours as  needed for nausea or vomiting., Disp: 20 tablet, Rfl: 0  EXAM:  VITALS per patient if applicable:  GENERAL: alert, oriented, appears well and in no acute distress  HEENT: atraumatic, conjunttiva clear, no obvious abnormalities on inspection of external nose and ears  NECK: normal movements of the head and neck  LUNGS: on inspection no signs of respiratory distress, breathing rate appears normal, no obvious gross SOB, gasping or wheezing  CV: no obvious cyanosis  MS: moves all visible extremities without  noticeable abnormality  PSYCH/NEURO: pleasant and cooperative, no obvious depression or anxiety, speech and thought processing grossly intact  ASSESSMENT AND PLAN:  Discussed the following assessment and plan:  Influenza-like illness  -we discussed possible serious and likely etiologies, options for evaluation and workup, limitations of telemedicine visit vs in person visit, treatment, treatment risks and precautions. Pt is agreeable to treatment via telemedicine at this moment. Suspect influenza given symptoms and exposure. Discussed tamiflu - she opted against this. Symptomatic care measures with OTC options discussed - advised to read labels carefully with otc medications to ensure no yellow dye (an allergy for her.) Advised to seek prompt in person care if worsening, new symptoms arise, or if is not improving with treatment. Discussed options for inperson care if PCP office not available.    I discussed the assessment and treatment plan with the patient. The patient was provided an opportunity to ask questions and all were answered. The patient agreed with the plan and demonstrated an understanding of the instructions.     Lucretia Kern, DO

## 2021-06-28 NOTE — Patient Instructions (Addendum)
-drink plenty of fluids  -ibuprofen per instructions (ensure no yellow dye)  -Robitussin(guaiphenesin) for cough - ensure no yellow dye.  -a humidifier at night can help  -afrin nasal spray for 3 days   I hope you are feeling better soon!  Seek in person care promptly if your symptoms worsen, new concerns arise or you are not improving with treatment.  It was nice to meet you today. I help [facility] out with telemedicine visits on Tuesdays and Thursdays and am available for visits on those days. If you have any concerns or questions following this visit please schedule a follow up visit with your Primary Care doctor or seek care at a local urgent care clinic to avoid delays in care.       Influenza, Adult Influenza is also called "the flu." It is an infection in the lungs, nose, and throat (respiratory tract). It spreads easily from person to person (is contagious). The flu causes symptoms that are like a cold, along with high fever and body aches. What are the causes? This condition is caused by the influenza virus. You can get the virus by: Breathing in droplets that are in the air after a person infected with the flu coughed or sneezed. Touching something that has the virus on it and then touching your mouth, nose, or eyes. What increases the risk? Certain things may make you more likely to get the flu. These include: Not washing your hands often. Having close contact with many people during cold and flu season. Touching your mouth, eyes, or nose without first washing your hands. Not getting a flu shot every year. You may have a higher risk for the flu, and serious problems, such as a lung infection (pneumonia), if you: Are older than 65. Are pregnant. Have a weakened disease-fighting system (immune system) because of a disease or because you are taking certain medicines. Have a long-term (chronic) condition, such as: Heart, kidney, or lung disease. Diabetes. Asthma. Have  a liver disorder. Are very overweight (morbidly obese). Have anemia. What are the signs or symptoms? Symptoms usually begin suddenly and last 4-14 days. They may include: Fever and chills. Headaches, body aches, or muscle aches. Sore throat. Cough. Runny or stuffy (congested) nose. Feeling discomfort in your chest. Not wanting to eat as much as normal. Feeling weak or tired. Feeling dizzy. Feeling sick to your stomach or throwing up. How is this treated? If the flu is found early, you can be treated with antiviral medicine. This can help to reduce how bad the illness is and how long it lasts. This may be given by mouth or through an IV tube. Taking care of yourself at home can help your symptoms get better. Your doctor may want you to: Take over-the-counter medicines. Drink plenty of fluids. The flu often goes away on its own. If you have very bad symptoms or other problems, you may be treated in a hospital. Follow these instructions at home:   Activity Rest as needed. Get plenty of sleep. Stay home from work or school as told by your doctor. Do not leave home until you do not have a fever for 24 hours without taking medicine. Leave home only to go to your doctor. Eating and drinking Take an ORS (oral rehydration solution). This is a drink that is sold at pharmacies and stores. Drink enough fluid to keep your pee pale yellow. Drink clear fluids in small amounts as you are able. Clear fluids include: Water. Ice chips. Fruit  juice mixed with water. Low-calorie sports drinks. Eat bland foods that are easy to digest. Eat small amounts as you are able. These foods include: Bananas. Applesauce. Rice. Lean meats. Toast. Crackers. Do not eat or drink: Fluids that have a lot of sugar or caffeine. Alcohol. Spicy or fatty foods. General instructions Take over-the-counter and prescription medicines only as told by your doctor. Use a cool mist humidifier to add moisture to the air  in your home. This can make it easier for you to breathe. When using a cool mist humidifier, clean it daily. Empty water and replace with clean water. Cover your mouth and nose when you cough or sneeze. Wash your hands with soap and water often and for at least 20 seconds. This is also important after you cough or sneeze. If you cannot use soap and water, use alcohol-based hand sanitizer. Keep all follow-up visits. How is this prevented?  Get a flu shot every year. You may get the flu shot in late summer, fall, or winter. Ask your doctor when you should get your flu shot. Avoid contact with people who are sick during fall and winter. This is cold and flu season. Contact a doctor if: You get new symptoms. You have: Chest pain. Watery poop (diarrhea). A fever. Your cough gets worse. You start to have more mucus. You feel sick to your stomach. You throw up. Get help right away if you: Have shortness of breath. Have trouble breathing. Have skin or nails that turn a bluish color. Have very bad pain or stiffness in your neck. Get a sudden headache. Get sudden pain in your face or ear. Cannot eat or drink without throwing up. These symptoms may represent a serious problem that is an emergency. Get medical help right away. Call your local emergency services (911 in the [place] Do not wait to see if the symptoms will go away. Do not drive yourself to the hospital. Summary Influenza is also called "the flu." It is an infection in the lungs, nose, and throat. It spreads easily from person to person. Take over-the-counter and prescription medicines only as told by your doctor. Getting a flu shot every year is the best way to not get the flu. This information is not intended to replace advice given to you by your health care provider. Make sure you discuss any questions you have with your health care provider. Document Revised: 04/02/2020 Document Reviewed: 04/02/2020 Elsevier Patient Education   [employer].

## 2021-06-30 ENCOUNTER — Ambulatory Visit: Payer: No Typology Code available for payment source | Admitting: Psychology

## 2021-07-18 ENCOUNTER — Ambulatory Visit (INDEPENDENT_AMBULATORY_CARE_PROVIDER_SITE_OTHER): Payer: No Typology Code available for payment source | Admitting: Psychology

## 2021-07-18 DIAGNOSIS — F419 Anxiety disorder, unspecified: Secondary | ICD-10-CM

## 2021-08-01 ENCOUNTER — Ambulatory Visit: Payer: No Typology Code available for payment source | Admitting: Psychology

## 2021-09-19 ENCOUNTER — Other Ambulatory Visit: Payer: Self-pay | Admitting: Physician Assistant

## 2021-09-19 DIAGNOSIS — R110 Nausea: Secondary | ICD-10-CM

## 2021-09-19 MED ORDER — DULOXETINE HCL 60 MG PO CPEP: ORAL_CAPSULE | ORAL | 0 refills | Status: DC

## 2021-09-19 MED ORDER — ONDANSETRON HCL 4 MG PO TABS: 4.0000 mg | ORAL_TABLET | Freq: Three times a day (TID) | ORAL | 0 refills | Status: DC | PRN

## 2021-09-25 ENCOUNTER — Other Ambulatory Visit: Payer: Self-pay | Admitting: Family Medicine

## 2021-09-30 ENCOUNTER — Other Ambulatory Visit (HOSPITAL_BASED_OUTPATIENT_CLINIC_OR_DEPARTMENT_OTHER): Payer: Self-pay

## 2021-09-30 MED ORDER — AMPHETAMINE-DEXTROAMPHETAMINE 20 MG PO TABS
ORAL_TABLET | ORAL | 0 refills | Status: DC
  Filled 2021-10-03: qty 90, 30d supply, fill #0

## 2021-10-03 ENCOUNTER — Other Ambulatory Visit (HOSPITAL_BASED_OUTPATIENT_CLINIC_OR_DEPARTMENT_OTHER): Payer: Self-pay

## 2021-10-17 ENCOUNTER — Other Ambulatory Visit: Payer: Self-pay | Admitting: Physician Assistant

## 2021-10-19 ENCOUNTER — Other Ambulatory Visit: Payer: Self-pay | Admitting: Physician Assistant

## 2021-10-24 ENCOUNTER — Other Ambulatory Visit: Payer: Self-pay | Admitting: Physician Assistant

## 2021-10-24 DIAGNOSIS — R110 Nausea: Secondary | ICD-10-CM

## 2021-10-24 MED ORDER — ONDANSETRON HCL 4 MG PO TABS: 4.0000 mg | ORAL_TABLET | Freq: Three times a day (TID) | ORAL | 0 refills | Status: DC | PRN

## 2021-10-28 ENCOUNTER — Other Ambulatory Visit (HOSPITAL_BASED_OUTPATIENT_CLINIC_OR_DEPARTMENT_OTHER): Payer: Self-pay

## 2021-10-28 MED ORDER — AMPHETAMINE-DEXTROAMPHETAMINE 20 MG PO TABS: ORAL_TABLET | ORAL | 0 refills | Status: DC

## 2021-10-31 ENCOUNTER — Other Ambulatory Visit (HOSPITAL_BASED_OUTPATIENT_CLINIC_OR_DEPARTMENT_OTHER): Payer: Self-pay

## 2021-11-01 ENCOUNTER — Other Ambulatory Visit (HOSPITAL_BASED_OUTPATIENT_CLINIC_OR_DEPARTMENT_OTHER): Payer: Self-pay

## 2022-01-03 ENCOUNTER — Other Ambulatory Visit: Payer: Self-pay | Admitting: Physician Assistant

## 2022-01-03 ENCOUNTER — Encounter: Payer: Self-pay | Admitting: Physician Assistant

## 2022-01-03 DIAGNOSIS — R110 Nausea: Secondary | ICD-10-CM

## 2022-01-04 MED ORDER — ONDANSETRON HCL 4 MG PO TABS: 4.0000 mg | ORAL_TABLET | Freq: Three times a day (TID) | ORAL | 0 refills | Status: DC | PRN

## 2022-01-04 MED ORDER — ALPRAZOLAM 0.5 MG PO TABS: 0.5000 mg | ORAL_TABLET | Freq: Four times a day (QID) | ORAL | 1 refills | Status: DC | PRN

## 2022-01-31 ENCOUNTER — Other Ambulatory Visit: Payer: Self-pay | Admitting: Physician Assistant

## 2022-02-13 ENCOUNTER — Other Ambulatory Visit: Payer: Self-pay | Admitting: Physician Assistant

## 2022-03-02 ENCOUNTER — Other Ambulatory Visit: Payer: Self-pay | Admitting: Physician Assistant

## 2022-03-15 ENCOUNTER — Encounter: Payer: Self-pay | Admitting: Physician Assistant

## 2022-03-16 ENCOUNTER — Telehealth: Payer: No Typology Code available for payment source | Admitting: Family

## 2022-03-16 ENCOUNTER — Encounter: Payer: Self-pay | Admitting: Family

## 2022-03-16 VITALS — HR 82 | Temp 99.3°F | Ht 66.0 in | Wt 164.0 lb

## 2022-03-16 DIAGNOSIS — U071 COVID-19: Secondary | ICD-10-CM | POA: Diagnosis not present

## 2022-03-16 NOTE — Progress Notes (Signed)
MyChart Video Visit    Virtual Visit via Video Note   This visit type was conducted due to national recommendations for restrictions regarding the COVID-19 Pandemic (e.g. social distancing) in an effort to limit this patient's exposure and mitigate transmission in our community. This patient is at least at moderate risk for complications without adequate follow up. This format is felt to be most appropriate for this patient at this time. Physical exam was limited by quality of the video and audio technology used for the visit. CMA was able to get the patient set up on a video visit.  Patient location: Home. Patient and provider in visit Provider location: Office  I discussed the limitations of evaluation and management by telemedicine and the availability of in person appointments. The patient expressed understanding and agreed to proceed.  Visit Date: 03/16/2022  Today's healthcare provider: Jeanie Sewer, NP     Subjective:    Patient ID: [name] [name], female    DOB: 1981-02-02, 41 y.o.   MRN: [id number]  Chief Complaint  Patient presents with   Covid Positive    Pt tested positive for COVID on 7/19 but Showed symptoms on 7/16. Symptoms include nasal congestion,left ear fullness, dry cough(worsening at night) and headaches. Has tried mucinex, severe cold(sudafed) and tylenol which did help some.    HPI Upper Respiratory Infection: Symptoms include left ear pressure/pain, achiness, non productive cough, sinus pressure, and sore throat.  Onset of symptoms was 3 days ago, unchanged since that time. She is drinking moderate amounts of fluids. Evaluation to date: none. Treatment to date: antihistamines and decongestants. Reports husband and one child also positive.    Assessment & Plan:   Problem List Items Addressed This Visit   None Visit Diagnoses     COVID-19    -  Primary pt states she is actually feeling a little better today, just wanted to discuss the  antivirals and to see if she should take or not.  Husband and 1 child are also positive.  Advised patient on side effects of antiviral and due to her not having any concerning risk factors, she should do well with continued conservative treatment, including taking over-the-counter Sudafed, ibuprofen, and/or Mucinex, along with drinking plenty of water.  Call back if symptoms or not resolving.       Past Medical History:  Diagnosis Date   ADHD (attention deficit hyperactivity disorder)    Anxiety    Depression    Fibromyalgia    Gallstones    Gestational diabetes    History of chicken pox    History of condyloma acuminatum    Migraines     Past Surgical History:  Procedure Laterality Date   CESAREAN SECTION  06/25/2011   CESAREAN SECTION N/A 05/04/2015   CHOLECYSTECTOMY N/A 07/07/2015   ERCP N/A 08/24/2015   KNEE CARTILAGE SURGERY Left     Outpatient Medications Prior to Visit  Medication Sig Dispense Refill   albuterol (VENTOLIN HFA) 108 (90 Base) MCG/ACT inhaler TAKE 2 PUFFS BY MOUTH EVERY 6 HOURS AS NEEDED FOR WHEEZE OR SHORTNESS OF BREATH 8.5 each 0   ALPRAZolam (XANAX) 0.5 MG tablet Take 1 tablet (0.5 mg total) by mouth 4 (four) times daily as needed for anxiety. 30 tablet 1   amphetamine-dextroamphetamine (ADDERALL) 20 MG tablet Take 20 mg by mouth 3 (three) times daily.     amphetamine-dextroamphetamine (ADDERALL) 20 MG tablet 1 PO TID 90 tablet 0   cetirizine (ZYRTEC) 10 MG tablet Take  10 mg by mouth daily.     DULoxetine (CYMBALTA) 60 MG capsule TAKE 2 CAPSULES BY MOUTH EVERY DAY 180 capsule 0   ondansetron (ZOFRAN) 4 MG tablet Take 1 tablet (4 mg total) by mouth every 8 (eight) hours as needed for nausea or vomiting. 20 tablet 0   gabapentin (NEURONTIN) 100 MG capsule TAKE 1 CAPSULE MOUTH AT BEDTIME , MAY INCREASE TO 1 BY MOUTH 3 TIMES A DAY IF NEEDED 90 capsule 3   butalbital-acetaminophen-caffeine (FIORICET) 50-325-40 MG tablet Take 1 tablet by mouth every 6 (six) hours  as needed for headache. (Patient not taking: Reported on 03/16/2022) 20 tablet 3   No facility-administered medications prior to visit.    Allergies  Allergen Reactions   Ibuprofen Hives and Other (See Comments)    stuffiness   Toradol [Ketorolac Tromethamine] Hives   Aspirin Swelling   Carafate [Sucralfate] Itching   Clindamycin Hives and Itching   Food     Nuts and Yeast   Latex Hives   Lovenox [Enoxaparin Sodium] Other (See Comments)    Face flush and redness   Mitigare [Colchicine]    Nsaids Hives   Other Hives    CATS   Penicillins Hives   Tizanidine Itching   Yellow Dyes (Non-Tartrazine) Hives    YELLOW No. 5   Zanaflex [Tizanidine Hcl] Rash       Objective:     Physical Exam Vitals and nursing note reviewed.  Constitutional:      General: She is not in acute distress.    Appearance: Normal appearance.  HENT:     Head: Normocephalic.  Pulmonary:     Effort: No respiratory distress.  Musculoskeletal:     Cervical back: Normal range of motion.  Skin:    General: Skin is dry.     Coloration: Skin is not pale.  Neurological:     Mental Status: She is alert and oriented to person, place, and time.  Psychiatric:        Mood and Affect: Mood normal.   Pulse 82   Temp 99.3 F (37.4 C) (Temporal)   Ht '5\' 6"'$  (1.676 m)   Wt 164 lb (74.4 kg)   LMP 03/08/2022 (Approximate)   SpO2 99%   BMI 26.47 kg/m   Wt Readings from Last 3 Encounters:  03/16/22 164 lb (74.4 kg)  04/15/21 167 lb 3.2 oz (75.8 kg)  08/16/20 162 lb (73.5 kg)        I discussed the assessment and treatment plan with the patient. The patient was provided an opportunity to ask questions and all were answered. The patient agreed with the plan and demonstrated an understanding of the instructions.   The patient was advised to call back or seek an in-person evaluation if the symptoms worsen or if the condition fails to improve as anticipated.  I provided 21 minutes of face-to-face time  during this encounter.  Jeanie Sewer, NP [place] [phone] (phone) [phone] (fax)  [place]

## 2022-03-26 ENCOUNTER — Other Ambulatory Visit: Payer: Self-pay | Admitting: Physician Assistant

## 2022-05-04 ENCOUNTER — Encounter: Payer: Self-pay | Admitting: Physician Assistant

## 2022-05-11 ENCOUNTER — Other Ambulatory Visit: Payer: Self-pay | Admitting: Physician Assistant

## 2022-05-12 ENCOUNTER — Other Ambulatory Visit: Payer: Self-pay | Admitting: Physician Assistant

## 2022-05-17 ENCOUNTER — Other Ambulatory Visit: Payer: Self-pay | Admitting: Family Medicine

## 2022-05-17 DIAGNOSIS — R0781 Pleurodynia: Secondary | ICD-10-CM

## 2022-05-17 DIAGNOSIS — R109 Unspecified abdominal pain: Secondary | ICD-10-CM

## 2022-05-18 ENCOUNTER — Ambulatory Visit
Admission: RE | Admit: 2022-05-18 | Discharge: 2022-05-18 | Disposition: A | Discharge status: Home / Self Care | Payer: No Typology Code available for payment source | Source: Ambulatory Visit | Attending: Family Medicine | Admitting: Family Medicine

## 2022-05-18 DIAGNOSIS — R0781 Pleurodynia: Secondary | ICD-10-CM

## 2022-05-18 DIAGNOSIS — R109 Unspecified abdominal pain: Secondary | ICD-10-CM

## 2022-05-18 MED ORDER — IOPAMIDOL (ISOVUE-300) INJECTION 61%
100.0000 mL | Freq: Once | INTRAVENOUS | Status: AC | PRN
  Administered 2022-05-18: 100 mL via INTRAVENOUS

## 2022-05-22 ENCOUNTER — Encounter: Payer: Self-pay | Admitting: *Deleted

## 2022-05-24 ENCOUNTER — Encounter: Payer: Self-pay | Admitting: Physician Assistant

## 2022-05-24 ENCOUNTER — Ambulatory Visit (INDEPENDENT_AMBULATORY_CARE_PROVIDER_SITE_OTHER): Payer: No Typology Code available for payment source | Admitting: Physician Assistant

## 2022-05-24 ENCOUNTER — Ambulatory Visit (INDEPENDENT_AMBULATORY_CARE_PROVIDER_SITE_OTHER)
Admission: RE | Admit: 2022-05-24 | Discharge: 2022-05-24 | Disposition: A | Discharge status: Home / Self Care | Payer: No Typology Code available for payment source | Source: Ambulatory Visit | Attending: Physician Assistant | Admitting: Physician Assistant

## 2022-05-24 VITALS — BP 118/80 | HR 97 | Temp 97.7°F | Ht 66.0 in | Wt 155.2 lb

## 2022-05-24 DIAGNOSIS — R059 Cough, unspecified: Secondary | ICD-10-CM

## 2022-05-24 DIAGNOSIS — S2232XA Fracture of one rib, left side, initial encounter for closed fracture: Secondary | ICD-10-CM

## 2022-05-24 LAB — POC COVID19 BINAXNOW: SARS Coronavirus 2 Ag: NEGATIVE

## 2022-05-24 MED ORDER — AZITHROMYCIN 250 MG PO TABS: ORAL_TABLET | ORAL | 0 refills | Status: AC

## 2022-05-24 MED ORDER — HYDROCOD POLI-CHLORPHE POLI ER 10-8 MG/5ML PO SUER: 5.0000 mL | Freq: Every evening | ORAL | 0 refills | Status: DC | PRN

## 2022-05-24 NOTE — Progress Notes (Signed)
[name] [name] is a 41 y.o. female here for a follow up of a pre-existing problem.  History of Present Illness:   Chief Complaint  Patient presents with   Cough    Pt c/o cough and congestion started on Sunday. Having chills but has not taken temp. Headaches and some dizziness. She has left rib fracture x 4 weeks.    Cough    4 weeks out from rib fracture -- Minimally displaced left anterolateral eighth rib fracture. (Seen on CT)  She is having cough, congestion, chills, sweats. Has been in bed the past 48 hours. She is not eating much but she is pushing fluids. Both kids have recently had ear infections. Denies: sudden onset SOB, chest pain  She is having a bit of SOB when is up walking around. Has not tried anything for her symptoms.   Past Medical History:  Diagnosis Date   ADHD (attention deficit hyperactivity disorder)    Anxiety    Depression    Fibromyalgia    Gallstones    Gestational diabetes    History of chicken pox    History of condyloma acuminatum    Migraines      Social History   Tobacco Use   Smoking status: Some Days   Smokeless tobacco: Never   Tobacco comments:    smokes socially (1 cigarette a week)  Vaping Use   Vaping Use: Never used  Substance Use Topics   Alcohol use: No    Alcohol/week: 0.0 standard drinks of alcohol   Drug use: No    Past Surgical History:  Procedure Laterality Date   CESAREAN SECTION  06/25/2011   CESAREAN SECTION N/A 05/04/2015   CHOLECYSTECTOMY N/A 07/07/2015   ERCP N/A 08/24/2015   KNEE CARTILAGE SURGERY Left     Family History  Problem Relation Age of Onset   Hypertension Mother    Migraines Mother    Skin cancer Mother        basal/squamous   Hypertension Father    Migraines Sister    Heart disease Maternal Grandmother    Heart disease Maternal Grandfather    Diabetes Paternal Grandmother    Prostate cancer Paternal Grandfather    Esophageal cancer Paternal Grandfather    Healthy Daughter    Asthma  Daughter    Healthy Son    Anesthesia problems Neg Hx    Hypotension Neg Hx    Malignant hyperthermia Neg Hx    Pseudochol deficiency Neg Hx    Colon cancer Neg Hx     Allergies  Allergen Reactions   Ibuprofen Hives and Other (See Comments)    stuffiness   Toradol [Ketorolac Tromethamine] Hives   Aspirin Swelling   Carafate [Sucralfate] Itching   Clindamycin Hives and Itching   Food     Nuts and Yeast   Latex Hives   Lovenox [Enoxaparin Sodium] Other (See Comments)    Face flush and redness   Mitigare [Colchicine]    Nsaids Hives   Other Hives    CATS   Penicillins Hives   Tizanidine Itching   Yellow Dyes (Non-Tartrazine) Hives    YELLOW No. 5   Zanaflex [Tizanidine Hcl] Rash    Current Medications:   Current Outpatient Medications:    albuterol (VENTOLIN HFA) 108 (90 Base) MCG/ACT inhaler, TAKE 2 PUFFS BY MOUTH EVERY 6 HOURS AS NEEDED FOR WHEEZE OR SHORTNESS OF BREATH, Disp: 8.5 each, Rfl: 0   ALPRAZolam (XANAX) 0.5 MG tablet, Take 1 tablet (0.5 mg  total) by mouth 4 (four) times daily as needed for anxiety., Disp: 30 tablet, Rfl: 1   amphetamine-dextroamphetamine (ADDERALL) 20 MG tablet, Take 20 mg by mouth 3 (three) times daily., Disp: , Rfl:    amphetamine-dextroamphetamine (ADDERALL) 20 MG tablet, 1 PO TID, Disp: 90 tablet, Rfl: 0   cetirizine (ZYRTEC) 10 MG tablet, Take 10 mg by mouth daily., Disp: , Rfl:    DULoxetine (CYMBALTA) 60 MG capsule, TAKE 2 CAPSULES BY MOUTH EVERY DAY, Disp: 180 capsule, Rfl: 0   levonorgestrel (MIRENA) 20 MCG/DAY IUD, 1 each by Intrauterine route once., Disp: , Rfl:    ondansetron (ZOFRAN) 4 MG tablet, Take 1 tablet (4 mg total) by mouth every 8 (eight) hours as needed for nausea or vomiting., Disp: 20 tablet, Rfl: 0   Review of Systems:   Review of Systems  Respiratory:  Positive for cough.    Negative unless otherwise specified per HPI.  Vitals:   Vitals:   05/24/22 1006  BP: 118/80  Pulse: 97  Temp: 97.7 F (36.5 C)   TempSrc: Temporal  SpO2: 96%  Weight: 155 lb 4 oz (70.4 kg)  Height: '5\' 6"'$  (1.676 m)     Body mass index is 25.06 kg/m.  Physical Exam:   Physical Exam Vitals and nursing note reviewed.  Constitutional:      General: She is not in acute distress.    Appearance: She is well-developed. She is not ill-appearing or toxic-appearing.  HENT:     Head: Normocephalic and atraumatic.     Right Ear: Tympanic membrane, ear canal and external ear normal. Tympanic membrane is not erythematous, retracted or bulging.     Left Ear: Tympanic membrane, ear canal and external ear normal. Tympanic membrane is not erythematous, retracted or bulging.     Nose: Nose normal.     Right Sinus: No maxillary sinus tenderness or frontal sinus tenderness.     Left Sinus: No maxillary sinus tenderness or frontal sinus tenderness.     Mouth/Throat:     Pharynx: Uvula midline. No posterior oropharyngeal erythema.  Eyes:     General: Lids are normal.     Conjunctiva/sclera: Conjunctivae normal.  Neck:     Trachea: Trachea normal.  Cardiovascular:     Rate and Rhythm: Normal rate and regular rhythm.     Pulses: Normal pulses.     Heart sounds: Normal heart sounds, S1 normal and S2 normal.  Pulmonary:     Effort: Pulmonary effort is normal.     Breath sounds: Normal breath sounds. No decreased breath sounds, wheezing, rhonchi or rales.  Lymphadenopathy:     Cervical: No cervical adenopathy.  Skin:    General: Skin is warm and dry.  Neurological:     Mental Status: She is alert.     GCS: GCS eye subscore is 4. GCS verbal subscore is 5. GCS motor subscore is 6.  Psychiatric:        Speech: Speech normal.        Behavior: Behavior normal. Behavior is cooperative.    Results for orders placed or performed in visit on 05/24/22  POC COVID-19  Result Value Ref Range   SARS Coronavirus 2 Ag Negative Negative    Assessment and Plan:   Cough, unspecified type No red flags on exam I am unable to  auscultate her lungs well due to significant coughing Due to recent rib fracture, will obtain CXR to evaluate for possible PNA Will start azithromycin COVID test neg Follow-up if  new/worsening sx  Inda Coke, PA-C

## 2022-05-24 NOTE — Patient Instructions (Signed)
It was great to see you!  Start azithromycin antibiotic  Use cough syrup at night and do not drive while taking this  An order for an xray has been put in for you. To get your xray, you can walk in at the [facility] location without a scheduled appointment.  The address is [address]. It is across the street from [facility] is located in the basement.  Hours of operation are M-F 8:30am to 5:00pm. Please note that they are closed for lunch between 12:30 and 1:00pm.

## 2022-05-25 ENCOUNTER — Encounter: Payer: Self-pay | Admitting: Physician Assistant

## 2022-05-25 ENCOUNTER — Other Ambulatory Visit: Payer: Self-pay | Admitting: Physician Assistant

## 2022-05-25 MED ORDER — GUAIFENESIN-CODEINE 100-10 MG/5ML PO SYRP: 5.0000 mL | ORAL_SOLUTION | Freq: Three times a day (TID) | ORAL | 0 refills | Status: DC | PRN

## 2022-05-29 ENCOUNTER — Other Ambulatory Visit (HOSPITAL_COMMUNITY): Payer: Self-pay

## 2022-05-29 MED ORDER — AMPHETAMINE-DEXTROAMPHETAMINE 20 MG PO TABS
20.0000 mg | ORAL_TABLET | Freq: Three times a day (TID) | ORAL | 0 refills | Status: DC
  Filled 2022-06-01: qty 90, 30d supply, fill #0

## 2022-05-30 ENCOUNTER — Other Ambulatory Visit (HOSPITAL_COMMUNITY): Payer: Self-pay

## 2022-06-01 ENCOUNTER — Other Ambulatory Visit (HOSPITAL_COMMUNITY): Payer: Self-pay

## 2022-06-21 ENCOUNTER — Encounter: Payer: Self-pay | Admitting: Physician Assistant

## 2022-06-21 ENCOUNTER — Ambulatory Visit: Payer: No Typology Code available for payment source | Admitting: Physician Assistant

## 2022-06-21 VITALS — BP 120/78 | HR 93 | Temp 97.7°F | Ht 66.0 in | Wt 159.0 lb

## 2022-06-21 DIAGNOSIS — R059 Cough, unspecified: Secondary | ICD-10-CM | POA: Diagnosis not present

## 2022-06-21 MED ORDER — GUAIFENESIN-CODEINE 100-10 MG/5ML PO SYRP: 5.0000 mL | ORAL_SOLUTION | Freq: Every evening | ORAL | 0 refills | Status: DC

## 2022-06-21 NOTE — Progress Notes (Signed)
[name] [name] is a 41 y.o. female here for a follow up of a pre-existing problem.  History of Present Illness:   Chief Complaint  Patient presents with   Cough    Pt c/o persistent cough, worse at night.    HPI  Cough Started on Azithromycin and Tussionex at last visit with me on 05/24/22. Cough improving but most bothersome at night. Had relief with cough syrup. Has accompanying wheezing. Reports stable O2 sats at home.  CT 05/18/22: Minimally displaced left anterolateral eighth rib fracture.  Chest XR 05/24/22: No new abnormalities. Lingular atelectasis again identified from 05/18/2022. Known LEFT 8th rib fracture difficult to visualize on this study.  Sees Dr. Rip Harbour, sports med, who advised her to use K tape.   Past Medical History:  Diagnosis Date   ADHD (attention deficit hyperactivity disorder)    Anxiety    Depression    Fibromyalgia    Gallstones    Gestational diabetes    History of chicken pox    History of condyloma acuminatum    Migraines      Social History   Tobacco Use   Smoking status: Some Days   Smokeless tobacco: Never   Tobacco comments:    smokes socially (1 cigarette a week)  Vaping Use   Vaping Use: Never used  Substance Use Topics   Alcohol use: No    Alcohol/week: 0.0 standard drinks of alcohol   Drug use: No    Past Surgical History:  Procedure Laterality Date   CESAREAN SECTION  06/25/2011   CESAREAN SECTION N/A 05/04/2015   CHOLECYSTECTOMY N/A 07/07/2015   ERCP N/A 08/24/2015   KNEE CARTILAGE SURGERY Left     Family History  Problem Relation Age of Onset   Hypertension Mother    Migraines Mother    Skin cancer Mother        basal/squamous   Hypertension Father    Migraines Sister    Heart disease Maternal Grandmother    Heart disease Maternal Grandfather    Diabetes Paternal Grandmother    Prostate cancer Paternal Grandfather    Esophageal cancer Paternal Grandfather    Healthy Daughter    Asthma Daughter     Healthy Son    Anesthesia problems Neg Hx    Hypotension Neg Hx    Malignant hyperthermia Neg Hx    Pseudochol deficiency Neg Hx    Colon cancer Neg Hx     Allergies  Allergen Reactions   Ibuprofen Hives and Other (See Comments)    stuffiness   Toradol [Ketorolac Tromethamine] Hives   Aspirin Swelling   Carafate [Sucralfate] Itching   Clindamycin Hives and Itching   Food     Nuts and Yeast   Latex Hives   Lovenox [Enoxaparin Sodium] Other (See Comments)    Face flush and redness   Mitigare [Colchicine]    Nsaids Hives   Other Hives    CATS   Penicillins Hives   Tizanidine Itching   Yellow Dyes (Non-Tartrazine) Hives    YELLOW No. 5   Zanaflex [Tizanidine Hcl] Rash    Current Medications:   Current Outpatient Medications:    albuterol (VENTOLIN HFA) 108 (90 Base) MCG/ACT inhaler, TAKE 2 PUFFS BY MOUTH EVERY 6 HOURS AS NEEDED FOR WHEEZE OR SHORTNESS OF BREATH, Disp: 8.5 each, Rfl: 0   ALPRAZolam (XANAX) 0.5 MG tablet, Take 1 tablet (0.5 mg total) by mouth 4 (four) times daily as needed for anxiety., Disp: 30 tablet, Rfl: 1  amphetamine-dextroamphetamine (ADDERALL) 20 MG tablet, Take 20 mg by mouth 3 (three) times daily., Disp: , Rfl:    amphetamine-dextroamphetamine (ADDERALL) 20 MG tablet, 1 PO TID, Disp: 90 tablet, Rfl: 0   amphetamine-dextroamphetamine (ADDERALL) 20 MG tablet, Take 1 tablet (20 mg total) by mouth 3 (three) times daily., Disp: 90 tablet, Rfl: 0   cetirizine (ZYRTEC) 10 MG tablet, Take 10 mg by mouth daily., Disp: , Rfl:    DULoxetine (CYMBALTA) 60 MG capsule, TAKE 2 CAPSULES BY MOUTH EVERY DAY, Disp: 180 capsule, Rfl: 0   HYDROcodone-acetaminophen (NORCO/VICODIN) 5-325 MG tablet, Take 1-2 tablets by mouth as needed., Disp: , Rfl:    levonorgestrel (MIRENA) 20 MCG/DAY IUD, 1 each by Intrauterine route once., Disp: , Rfl:    ondansetron (ZOFRAN) 4 MG tablet, Take 1 tablet (4 mg total) by mouth every 8 (eight) hours as needed for nausea or vomiting., Disp:  20 tablet, Rfl: 0   guaiFENesin-codeine (ROBITUSSIN AC) 100-10 MG/5ML syrup, Take 5 mLs by mouth at bedtime., Disp: 120 mL, Rfl: 0   Review of Systems:   Review of Systems  Constitutional:  Negative for chills, fever, malaise/fatigue and weight loss.  HENT:  Negative for hearing loss, sinus pain and sore throat.   Respiratory:  Positive for cough and wheezing. Negative for hemoptysis.   Cardiovascular:  Negative for chest pain, palpitations, leg swelling and PND.  Gastrointestinal:  Negative for abdominal pain, constipation, diarrhea, heartburn, nausea and vomiting.  Genitourinary:  Negative for dysuria, frequency and urgency.  Musculoskeletal:  Positive for myalgias (rib pain). Negative for back pain and neck pain.  Skin:  Negative for itching and rash.  Neurological:  Negative for dizziness, tingling, seizures and headaches.  Endo/Heme/Allergies:  Negative for polydipsia.  Psychiatric/Behavioral:  Negative for depression. The patient is not nervous/anxious.     Vitals:   Vitals:   06/21/22 1525  BP: 120/78  Pulse: 93  Temp: 97.7 F (36.5 C)  TempSrc: Temporal  SpO2: 97%  Weight: 159 lb (72.1 kg)  Height: '5\' 6"'$  (1.676 m)     Body mass index is 25.66 kg/m.  Physical Exam:   Physical Exam Vitals and nursing note reviewed.  Constitutional:      General: She is not in acute distress.    Appearance: She is well-developed. She is not ill-appearing or toxic-appearing.  Cardiovascular:     Rate and Rhythm: Normal rate and regular rhythm.     Pulses: Normal pulses.     Heart sounds: Normal heart sounds, S1 normal and S2 normal.  Pulmonary:     Effort: Pulmonary effort is normal.     Breath sounds: Normal breath sounds.  Skin:    General: Skin is warm and dry.  Neurological:     Mental Status: She is alert.     GCS: GCS eye subscore is 4. GCS verbal subscore is 5. GCS motor subscore is 6.  Psychiatric:        Speech: Speech normal.        Behavior: Behavior normal.  Behavior is cooperative.     Assessment and Plan:   Cough No red flags -- lungs sound clear Symptoms are improving with time Consider flonase Will send in cheratussin for nighttime use Follow-up as needed  I,Alexis Herring,acting as a scribe for [employer], PA.,have documented all relevant documentation on the behalf of Inda Coke, PA,as directed by  Inda Coke, PA while in the presence of Inda Coke, [place].  I, Inda Coke, [place], have reviewed all  documentation for this visit. The documentation on 06/21/22 for the exam, diagnosis, procedures, and orders are all accurate and complete.  Inda Coke PA-C

## 2022-06-21 NOTE — Patient Instructions (Signed)
Vitals:   06/21/22 1525  BP: 120/78  Pulse: 93  Temp: 97.7 F (36.5 C)  SpO2: 97%

## 2022-06-28 ENCOUNTER — Other Ambulatory Visit (HOSPITAL_COMMUNITY): Payer: Self-pay

## 2022-06-28 MED ORDER — AMPHETAMINE-DEXTROAMPHETAMINE 20 MG PO TABS
20.0000 mg | ORAL_TABLET | Freq: Three times a day (TID) | ORAL | 0 refills | Status: DC
  Filled 2022-07-01: qty 90, 30d supply, fill #0

## 2022-06-29 ENCOUNTER — Other Ambulatory Visit (HOSPITAL_COMMUNITY): Payer: Self-pay

## 2022-07-01 ENCOUNTER — Other Ambulatory Visit (HOSPITAL_COMMUNITY): Payer: Self-pay

## 2022-07-11 ENCOUNTER — Encounter: Payer: Self-pay | Admitting: Physician Assistant

## 2022-07-11 ENCOUNTER — Ambulatory Visit: Payer: No Typology Code available for payment source | Admitting: Physician Assistant

## 2022-07-11 ENCOUNTER — Other Ambulatory Visit: Payer: Self-pay | Admitting: Physician Assistant

## 2022-07-11 VITALS — BP 120/80 | HR 85 | Temp 98.0°F | Ht 66.0 in | Wt 158.2 lb

## 2022-07-11 DIAGNOSIS — J0110 Acute frontal sinusitis, unspecified: Secondary | ICD-10-CM | POA: Diagnosis not present

## 2022-07-11 DIAGNOSIS — R110 Nausea: Secondary | ICD-10-CM | POA: Diagnosis not present

## 2022-07-11 MED ORDER — ALBUTEROL SULFATE HFA 108 (90 BASE) MCG/ACT IN AERS: 2.0000 | INHALATION_SPRAY | Freq: Four times a day (QID) | RESPIRATORY_TRACT | 0 refills | Status: DC | PRN

## 2022-07-11 MED ORDER — ONDANSETRON HCL 4 MG PO TABS: 4.0000 mg | ORAL_TABLET | Freq: Three times a day (TID) | ORAL | 0 refills | Status: DC | PRN

## 2022-07-11 MED ORDER — DOXYCYCLINE HYCLATE 100 MG PO TABS: 100.0000 mg | ORAL_TABLET | Freq: Two times a day (BID) | ORAL | 0 refills | Status: DC

## 2022-07-11 MED ORDER — FLUTICASONE PROPIONATE HFA 110 MCG/ACT IN AERO: 1.0000 | INHALATION_SPRAY | Freq: Two times a day (BID) | RESPIRATORY_TRACT | 1 refills | Status: DC

## 2022-07-11 MED ORDER — HYDROCODONE BIT-HOMATROP MBR 5-1.5 MG/5ML PO SOLN: 5.0000 mL | Freq: Every evening | ORAL | 0 refills | Status: DC | PRN

## 2022-07-11 NOTE — Progress Notes (Signed)
[name] [name] is a 41 y.o. female here for a follow up of a pre-existing problem.  History of Present Illness:   Chief Complaint  Patient presents with   Sinus Problem    Pt c/o sinus pressure started on Saturday, ear pressure and continues to have cough, worse at night. She has been using Sudafed and Tylenol.    HPI  Sinus Issues Patient is reporting sinus pressure occurring three days ago. She states that she is continuing to experience ear pressure, sore throat, and a cough which she manages her symptoms with Sudafed and Tylenol. Everyone in her household has nasal congestion.  Nausea Patient is requesting a refill on 4 mg Zofran as she is feeling nauseous.   Past Medical History:  Diagnosis Date   ADHD (attention deficit hyperactivity disorder)    Anxiety    Depression    Fibromyalgia    Gallstones    Gestational diabetes    History of chicken pox    History of condyloma acuminatum    Migraines      Social History   Tobacco Use   Smoking status: Some Days   Smokeless tobacco: Never   Tobacco comments:    smokes socially (1 cigarette a week)  Vaping Use   Vaping Use: Never used  Substance Use Topics   Alcohol use: No    Alcohol/week: 0.0 standard drinks of alcohol   Drug use: No    Past Surgical History:  Procedure Laterality Date   CESAREAN SECTION  06/25/2011   CESAREAN SECTION N/A 05/04/2015   CHOLECYSTECTOMY N/A 07/07/2015   ERCP N/A 08/24/2015   KNEE CARTILAGE SURGERY Left     Family History  Problem Relation Age of Onset   Hypertension Mother    Migraines Mother    Skin cancer Mother        basal/squamous   Hypertension Father    Migraines Sister    Heart disease Maternal Grandmother    Heart disease Maternal Grandfather    Diabetes Paternal Grandmother    Prostate cancer Paternal Grandfather    Esophageal cancer Paternal Grandfather    Healthy Daughter    Asthma Daughter    Healthy Son    Anesthesia problems Neg Hx    Hypotension Neg Hx     Malignant hyperthermia Neg Hx    Pseudochol deficiency Neg Hx    Colon cancer Neg Hx     Allergies  Allergen Reactions   Ibuprofen Hives and Other (See Comments)    stuffiness   Toradol [Ketorolac Tromethamine] Hives   Aspirin Swelling   Carafate [Sucralfate] Itching   Clindamycin Hives and Itching   Food     Nuts and Yeast   Latex Hives   Lovenox [Enoxaparin Sodium] Other (See Comments)    Face flush and redness   Mitigare [Colchicine]    Nsaids Hives   Other Hives    CATS   Penicillins Hives   Tizanidine Itching   Yellow Dyes (Non-Tartrazine) Hives    YELLOW No. 5   Zanaflex [Tizanidine Hcl] Rash    Current Medications:   Current Outpatient Medications:    albuterol (VENTOLIN HFA) 108 (90 Base) MCG/ACT inhaler, TAKE 2 PUFFS BY MOUTH EVERY 6 HOURS AS NEEDED FOR WHEEZE OR SHORTNESS OF BREATH, Disp: 8.5 each, Rfl: 0   ALPRAZolam (XANAX) 0.5 MG tablet, Take 1 tablet (0.5 mg total) by mouth 4 (four) times daily as needed for anxiety., Disp: 30 tablet, Rfl: 1   amphetamine-dextroamphetamine (ADDERALL) 20 MG  tablet, Take 20 mg by mouth 3 (three) times daily., Disp: , Rfl:    amphetamine-dextroamphetamine (ADDERALL) 20 MG tablet, 1 PO TID, Disp: 90 tablet, Rfl: 0   amphetamine-dextroamphetamine (ADDERALL) 20 MG tablet, Take 1 tablet (20 mg total) by mouth 3 (three) times daily., Disp: 90 tablet, Rfl: 0   amphetamine-dextroamphetamine (ADDERALL) 20 MG tablet, Take 1 tablet (20 mg total) by mouth 3 (three) times daily., Disp: 90 tablet, Rfl: 0   cetirizine (ZYRTEC) 10 MG tablet, Take 10 mg by mouth daily., Disp: , Rfl:    DULoxetine (CYMBALTA) 60 MG capsule, TAKE 2 CAPSULES BY MOUTH EVERY DAY, Disp: 180 capsule, Rfl: 0   levonorgestrel (MIRENA) 20 MCG/DAY IUD, 1 each by Intrauterine route once., Disp: , Rfl:    ondansetron (ZOFRAN) 4 MG tablet, Take 1 tablet (4 mg total) by mouth every 8 (eight) hours as needed for nausea or vomiting., Disp: 20 tablet, Rfl: 0   Review of  Systems:   Review of Systems  HENT:  Positive for ear pain, sinus pain and sore throat.   Respiratory:  Positive for cough.   Gastrointestinal:  Positive for nausea.    Vitals:   Vitals:   07/11/22 0817  BP: 120/80  Pulse: 85  Temp: 98 F (36.7 C)  TempSrc: Temporal  SpO2: 99%  Weight: 158 lb 4 oz (71.8 kg)  Height: '5\' 6"'$  (1.676 m)     Body mass index is 25.54 kg/m.  Physical Exam:   Physical Exam Constitutional:      General: She is not in acute distress.    Appearance: Normal appearance. She is not ill-appearing.  HENT:     Head: Normocephalic and atraumatic.     Right Ear: External ear normal.     Left Ear: External ear normal.  Eyes:     Extraocular Movements: Extraocular movements intact.     Pupils: Pupils are equal, round, and reactive to light.  Cardiovascular:     Rate and Rhythm: Normal rate and regular rhythm.     Heart sounds: Normal heart sounds. No murmur heard.    No gallop.  Pulmonary:     Effort: Pulmonary effort is normal. No respiratory distress.     Breath sounds: Wheezing (L>R) present. No rales.  Skin:    General: Skin is warm and dry.  Neurological:     Mental Status: She is alert and oriented to person, place, and time.  Psychiatric:        Judgment: Judgment normal.     Assessment and Plan:   Nausea No red flags Refill zofran 4 mg prn Follow-up as needed  Acute non-recurrent frontal sinusitis No red flags on exam.  Will initiate doxycycline and Flovent per orders. Discussed taking medications as prescribed. Reviewed return precautions including worsening fever, SOB, worsening cough or other concerns. Push fluids and rest. I recommend that patient follow-up if symptoms worsen or persist despite treatment x 7-10 days, sooner if needed.  I,Verona Buck,acting as a Education administrator for [employer], PA.,have documented all relevant documentation on the behalf of Inda Coke, PA,as directed by  Inda Coke, PA while in the presence of  Inda Coke, [place].  I, Inda Coke, [place], have reviewed all documentation for this visit. The documentation on 07/11/22 for the exam, diagnosis, procedures, and orders are all accurate and complete.  Inda Coke, PA-C

## 2022-07-27 ENCOUNTER — Other Ambulatory Visit (HOSPITAL_COMMUNITY): Payer: Self-pay

## 2022-07-27 MED ORDER — AMPHETAMINE-DEXTROAMPHETAMINE 20 MG PO TABS
20.0000 mg | ORAL_TABLET | Freq: Three times a day (TID) | ORAL | 0 refills | Status: DC
  Filled 2022-07-31: qty 90, 30d supply, fill #0

## 2022-07-27 MED ORDER — ALPRAZOLAM 0.5 MG PO TABS
0.5000 mg | ORAL_TABLET | Freq: Every day | ORAL | 2 refills | Status: DC | PRN
  Filled 2022-07-27: qty 30, 30d supply, fill #0
  Filled 2022-08-29 – 2022-08-30 (×2): qty 30, 30d supply, fill #1
  Filled 2022-10-04: qty 30, 30d supply, fill #2

## 2022-07-31 ENCOUNTER — Other Ambulatory Visit (HOSPITAL_COMMUNITY): Payer: Self-pay

## 2022-08-03 ENCOUNTER — Other Ambulatory Visit: Payer: Self-pay | Admitting: Physician Assistant

## 2022-08-07 ENCOUNTER — Other Ambulatory Visit: Payer: Self-pay | Admitting: Physician Assistant

## 2022-08-16 ENCOUNTER — Encounter: Payer: Self-pay | Admitting: Physician Assistant

## 2022-08-16 ENCOUNTER — Other Ambulatory Visit: Payer: Self-pay | Admitting: Physician Assistant

## 2022-08-16 MED ORDER — DOXYCYCLINE HYCLATE 100 MG PO TABS: 100.0000 mg | ORAL_TABLET | Freq: Two times a day (BID) | ORAL | 0 refills | Status: DC

## 2022-08-24 ENCOUNTER — Other Ambulatory Visit: Payer: Self-pay | Admitting: Physician Assistant

## 2022-08-24 MED ORDER — ALBUTEROL SULFATE HFA 108 (90 BASE) MCG/ACT IN AERS: 1.0000 | INHALATION_SPRAY | Freq: Four times a day (QID) | RESPIRATORY_TRACT | 3 refills | Status: AC | PRN

## 2022-08-29 ENCOUNTER — Other Ambulatory Visit (HOSPITAL_COMMUNITY): Payer: Self-pay

## 2022-08-29 MED ORDER — AMPHETAMINE-DEXTROAMPHETAMINE 20 MG PO TABS
20.0000 mg | ORAL_TABLET | Freq: Three times a day (TID) | ORAL | 0 refills | Status: DC
  Filled 2022-08-30: qty 90, 30d supply, fill #0
  Filled ????-??-??: fill #0

## 2022-08-30 ENCOUNTER — Other Ambulatory Visit: Payer: Self-pay

## 2022-08-30 ENCOUNTER — Other Ambulatory Visit (HOSPITAL_COMMUNITY): Payer: Self-pay

## 2022-09-27 ENCOUNTER — Other Ambulatory Visit (HOSPITAL_COMMUNITY): Payer: Self-pay

## 2022-09-27 MED ORDER — AMPHETAMINE-DEXTROAMPHETAMINE 20 MG PO TABS
20.0000 mg | ORAL_TABLET | Freq: Three times a day (TID) | ORAL | 0 refills | Status: DC
  Filled 2022-09-29: qty 90, 30d supply, fill #0

## 2022-09-28 ENCOUNTER — Other Ambulatory Visit (HOSPITAL_COMMUNITY): Payer: Self-pay

## 2022-09-29 ENCOUNTER — Other Ambulatory Visit (HOSPITAL_COMMUNITY): Payer: Self-pay

## 2022-10-04 ENCOUNTER — Other Ambulatory Visit: Payer: Self-pay

## 2022-10-26 ENCOUNTER — Other Ambulatory Visit (HOSPITAL_COMMUNITY): Payer: Self-pay

## 2022-10-26 MED ORDER — AMPHETAMINE-DEXTROAMPHETAMINE 20 MG PO TABS
20.0000 mg | ORAL_TABLET | Freq: Three times a day (TID) | ORAL | 0 refills | Status: DC
  Filled 2022-10-30: qty 90, 30d supply, fill #0

## 2022-10-28 ENCOUNTER — Other Ambulatory Visit (HOSPITAL_COMMUNITY): Payer: Self-pay

## 2022-10-30 ENCOUNTER — Other Ambulatory Visit (HOSPITAL_COMMUNITY): Payer: Self-pay

## 2022-11-05 ENCOUNTER — Other Ambulatory Visit: Payer: Self-pay | Admitting: Physician Assistant

## 2022-11-28 ENCOUNTER — Other Ambulatory Visit (HOSPITAL_COMMUNITY): Payer: Self-pay

## 2022-11-28 MED ORDER — AMPHETAMINE-DEXTROAMPHETAMINE 20 MG PO TABS
20.0000 mg | ORAL_TABLET | Freq: Three times a day (TID) | ORAL | 0 refills | Status: DC
  Filled 2022-11-29: qty 90, 30d supply, fill #0

## 2022-11-29 ENCOUNTER — Other Ambulatory Visit (HOSPITAL_COMMUNITY): Payer: Self-pay

## 2022-12-28 ENCOUNTER — Other Ambulatory Visit (HOSPITAL_COMMUNITY): Payer: Self-pay

## 2022-12-28 MED ORDER — AMPHETAMINE-DEXTROAMPHETAMINE 20 MG PO TABS
20.0000 mg | ORAL_TABLET | Freq: Three times a day (TID) | ORAL | 0 refills | Status: DC
  Filled 2022-12-29: qty 90, 30d supply, fill #0

## 2022-12-29 ENCOUNTER — Other Ambulatory Visit (HOSPITAL_COMMUNITY): Payer: Self-pay

## 2023-01-05 ENCOUNTER — Other Ambulatory Visit (HOSPITAL_COMMUNITY): Payer: Self-pay

## 2023-01-05 ENCOUNTER — Other Ambulatory Visit: Payer: Self-pay | Admitting: Family Medicine

## 2023-01-05 DIAGNOSIS — M25562 Pain in left knee: Secondary | ICD-10-CM

## 2023-01-05 MED ORDER — ALPRAZOLAM 0.5 MG PO TABS
0.5000 mg | ORAL_TABLET | Freq: Every day | ORAL | 2 refills | Status: DC
  Filled 2023-01-05: qty 30, 30d supply, fill #0
  Filled 2023-01-31: qty 30, 30d supply, fill #1
  Filled 2023-05-01: qty 30, 30d supply, fill #2

## 2023-01-12 ENCOUNTER — Other Ambulatory Visit: Payer: Self-pay | Admitting: Physician Assistant

## 2023-01-12 ENCOUNTER — Ambulatory Visit
Admission: RE | Admit: 2023-01-12 | Discharge: 2023-01-12 | Disposition: A | Discharge status: Home / Self Care | Payer: 59 | Source: Ambulatory Visit | Attending: Family Medicine | Admitting: Family Medicine

## 2023-01-12 DIAGNOSIS — R110 Nausea: Secondary | ICD-10-CM

## 2023-01-12 DIAGNOSIS — M25562 Pain in left knee: Secondary | ICD-10-CM

## 2023-01-12 MED ORDER — ONDANSETRON HCL 4 MG PO TABS: 4.0000 mg | ORAL_TABLET | Freq: Three times a day (TID) | ORAL | 0 refills | Status: DC | PRN

## 2023-01-26 ENCOUNTER — Other Ambulatory Visit: Payer: Self-pay

## 2023-01-26 ENCOUNTER — Other Ambulatory Visit (HOSPITAL_COMMUNITY): Payer: Self-pay

## 2023-01-26 MED ORDER — AMPHETAMINE-DEXTROAMPHETAMINE 20 MG PO TABS
20.0000 mg | ORAL_TABLET | Freq: Three times a day (TID) | ORAL | 0 refills | Status: DC
  Filled 2023-01-27: qty 90, 30d supply, fill #0

## 2023-01-27 ENCOUNTER — Other Ambulatory Visit (HOSPITAL_COMMUNITY): Payer: Self-pay

## 2023-02-01 ENCOUNTER — Other Ambulatory Visit: Payer: Self-pay

## 2023-02-01 ENCOUNTER — Other Ambulatory Visit: Payer: Self-pay | Admitting: Physician Assistant

## 2023-03-28 ENCOUNTER — Other Ambulatory Visit (HOSPITAL_COMMUNITY): Payer: Self-pay

## 2023-03-28 MED ORDER — AMPHETAMINE-DEXTROAMPHETAMINE 20 MG PO TABS
20.0000 mg | ORAL_TABLET | Freq: Three times a day (TID) | ORAL | 0 refills | Status: DC
  Filled 2023-04-28 (×2): qty 90, 30d supply, fill #0

## 2023-03-28 MED ORDER — AMPHETAMINE-DEXTROAMPHETAMINE 20 MG PO TABS
20.0000 mg | ORAL_TABLET | Freq: Three times a day (TID) | ORAL | 0 refills | Status: DC
  Filled ????-??-??: fill #0

## 2023-03-28 MED ORDER — AMPHETAMINE-DEXTROAMPHETAMINE 20 MG PO TABS
20.0000 mg | ORAL_TABLET | Freq: Three times a day (TID) | ORAL | 0 refills | Status: DC
  Filled 2023-05-28: qty 90, 30d supply, fill #0

## 2023-03-29 ENCOUNTER — Other Ambulatory Visit: Payer: Self-pay

## 2023-03-29 ENCOUNTER — Other Ambulatory Visit (HOSPITAL_COMMUNITY): Payer: Self-pay

## 2023-04-27 ENCOUNTER — Other Ambulatory Visit (HOSPITAL_COMMUNITY): Payer: Self-pay

## 2023-04-28 ENCOUNTER — Other Ambulatory Visit (HOSPITAL_COMMUNITY): Payer: Self-pay

## 2023-05-01 ENCOUNTER — Other Ambulatory Visit (HOSPITAL_COMMUNITY): Payer: Self-pay

## 2023-05-08 ENCOUNTER — Other Ambulatory Visit: Payer: Self-pay | Admitting: Physician Assistant

## 2023-05-14 ENCOUNTER — Other Ambulatory Visit: Payer: Self-pay | Admitting: Physician Assistant

## 2023-05-14 MED ORDER — DULOXETINE HCL 60 MG PO CPEP: ORAL_CAPSULE | ORAL | 0 refills | Status: DC

## 2023-05-28 ENCOUNTER — Other Ambulatory Visit (HOSPITAL_COMMUNITY): Payer: Self-pay

## 2023-06-10 ENCOUNTER — Other Ambulatory Visit: Payer: Self-pay | Admitting: Physician Assistant

## 2023-06-11 ENCOUNTER — Other Ambulatory Visit: Payer: Self-pay | Admitting: Physician Assistant

## 2023-06-25 ENCOUNTER — Other Ambulatory Visit (HOSPITAL_COMMUNITY): Payer: Self-pay

## 2023-06-25 MED ORDER — AMPHETAMINE-DEXTROAMPHETAMINE 20 MG PO TABS
20.0000 mg | ORAL_TABLET | Freq: Three times a day (TID) | ORAL | 0 refills | Status: DC
  Filled 2023-06-27 (×2): qty 90, 30d supply, fill #0

## 2023-06-26 ENCOUNTER — Other Ambulatory Visit (HOSPITAL_COMMUNITY): Payer: Self-pay

## 2023-06-26 ENCOUNTER — Other Ambulatory Visit: Payer: Self-pay

## 2023-06-27 ENCOUNTER — Other Ambulatory Visit (HOSPITAL_COMMUNITY): Payer: Self-pay

## 2023-06-27 ENCOUNTER — Other Ambulatory Visit: Payer: Self-pay

## 2023-06-29 ENCOUNTER — Other Ambulatory Visit (HOSPITAL_BASED_OUTPATIENT_CLINIC_OR_DEPARTMENT_OTHER): Payer: Self-pay

## 2023-07-03 ENCOUNTER — Other Ambulatory Visit (HOSPITAL_COMMUNITY): Payer: Self-pay

## 2023-07-03 MED ORDER — ALPRAZOLAM 0.5 MG PO TABS
0.5000 mg | ORAL_TABLET | Freq: Every day | ORAL | 2 refills | Status: AC | PRN
  Filled 2023-07-03 – 2023-07-27 (×2): qty 30, 30d supply, fill #0
  Filled 2023-08-29: qty 30, 30d supply, fill #1
  Filled 2023-10-02: qty 30, 30d supply, fill #2

## 2023-07-06 LAB — HM MAMMOGRAPHY

## 2023-07-11 LAB — HM PAP SMEAR: HPV, high-risk: NEGATIVE

## 2023-07-13 ENCOUNTER — Other Ambulatory Visit (HOSPITAL_COMMUNITY): Payer: Self-pay

## 2023-07-24 ENCOUNTER — Other Ambulatory Visit (HOSPITAL_COMMUNITY): Payer: Self-pay

## 2023-07-24 MED ORDER — AMPHETAMINE-DEXTROAMPHETAMINE 20 MG PO TABS
20.0000 mg | ORAL_TABLET | Freq: Three times a day (TID) | ORAL | 0 refills | Status: DC
  Filled 2023-07-27: qty 90, 30d supply, fill #0

## 2023-07-25 ENCOUNTER — Other Ambulatory Visit (HOSPITAL_COMMUNITY): Payer: Self-pay

## 2023-07-27 ENCOUNTER — Other Ambulatory Visit (HOSPITAL_COMMUNITY): Payer: Self-pay

## 2023-08-23 ENCOUNTER — Other Ambulatory Visit (HOSPITAL_COMMUNITY): Payer: Self-pay

## 2023-08-24 ENCOUNTER — Other Ambulatory Visit (HOSPITAL_COMMUNITY): Payer: Self-pay

## 2023-08-24 MED ORDER — AMPHETAMINE-DEXTROAMPHETAMINE 20 MG PO TABS
ORAL_TABLET | ORAL | 0 refills | Status: DC
  Filled 2023-08-27: qty 90, 30d supply, fill #0

## 2023-08-27 ENCOUNTER — Other Ambulatory Visit (HOSPITAL_COMMUNITY): Payer: Self-pay

## 2023-08-30 ENCOUNTER — Other Ambulatory Visit: Payer: Self-pay

## 2023-09-25 ENCOUNTER — Other Ambulatory Visit (HOSPITAL_COMMUNITY): Payer: Self-pay

## 2023-09-25 MED ORDER — AMPHETAMINE-DEXTROAMPHETAMINE 20 MG PO TABS
20.0000 mg | ORAL_TABLET | Freq: Three times a day (TID) | ORAL | 0 refills | Status: DC
  Filled 2023-09-26: qty 90, 30d supply, fill #0

## 2023-09-26 ENCOUNTER — Other Ambulatory Visit (HOSPITAL_COMMUNITY): Payer: Self-pay

## 2023-10-02 ENCOUNTER — Other Ambulatory Visit: Payer: Self-pay

## 2023-10-25 ENCOUNTER — Other Ambulatory Visit (HOSPITAL_COMMUNITY): Payer: Self-pay

## 2023-10-25 MED ORDER — AMPHETAMINE-DEXTROAMPHETAMINE 20 MG PO TABS
20.0000 mg | ORAL_TABLET | Freq: Three times a day (TID) | ORAL | 0 refills | Status: DC
  Filled 2023-10-26: qty 90, 30d supply, fill #0

## 2023-10-26 ENCOUNTER — Other Ambulatory Visit (HOSPITAL_COMMUNITY): Payer: Self-pay

## 2023-12-06 ENCOUNTER — Ambulatory Visit: Admitting: Physician Assistant

## 2023-12-06 VITALS — BP 130/82 | HR 100 | Temp 98.0°F | Ht 66.0 in | Wt 159.6 lb

## 2023-12-06 DIAGNOSIS — L739 Follicular disorder, unspecified: Secondary | ICD-10-CM

## 2023-12-06 MED ORDER — DULOXETINE HCL 60 MG PO CPEP: ORAL_CAPSULE | ORAL | 3 refills | Status: DC

## 2023-12-06 MED ORDER — MUPIROCIN CALCIUM 2 % EX CREA: 1.0000 | TOPICAL_CREAM | Freq: Two times a day (BID) | CUTANEOUS | 0 refills | Status: DC

## 2023-12-06 MED ORDER — CEPHALEXIN 500 MG PO CAPS: 500.0000 mg | ORAL_CAPSULE | Freq: Four times a day (QID) | ORAL | 0 refills | Status: DC

## 2023-12-06 NOTE — Progress Notes (Signed)
 [name] [name] is a 43 y.o. female here for a new problem.  History of Present Illness:   Chief Complaint  Patient presents with   Cyst    Pt states has been there for 3 days thought was pimp put pimp patch on it and it pull it out. But has pus coming out of it and is painful hard. It is at the bikini line.   Ear Fullness    States ears feel full at times believes due to weather.    HPI  Cyst: Pt complains of cyst in her groin area, appearing 3 days ago after shaving. At first, she believed it may have been a pimple and applied a pimple patch on the cyst.  She states "the head of the pimple came off" after removing the patch. Since then, her skin has been irritated and she has been cleaning it with hydrogen peroxide daily.  Pt also states that she bathes with Dial antibacterial soap.  Past Medical History:  Diagnosis Date   ADHD (attention deficit hyperactivity disorder)    Anxiety    Depression    Fibromyalgia    Gallstones    Gestational diabetes    History of chicken pox    History of condyloma acuminatum    Migraines      Social History   Tobacco Use   Smoking status: Some Days   Smokeless tobacco: Never   Tobacco comments:    smokes socially (1 cigarette a week)  Vaping Use   Vaping status: Never Used  Substance Use Topics   Alcohol use: No    Alcohol/week: 0.0 standard drinks of alcohol   Drug use: No    Past Surgical History:  Procedure Laterality Date   CESAREAN SECTION  06/25/2011   CESAREAN SECTION N/A 05/04/2015   CHOLECYSTECTOMY N/A 07/07/2015   ERCP N/A 08/24/2015   KNEE CARTILAGE SURGERY Left     Family History  Problem Relation Age of Onset   Hypertension Mother    Migraines Mother    Skin cancer Mother        basal/squamous   Hypertension Father    Migraines Sister    Heart disease Maternal Grandmother    Heart disease Maternal Grandfather    Diabetes Paternal Grandmother    Prostate cancer Paternal Grandfather    Esophageal cancer  Paternal Grandfather    Healthy Daughter    Asthma Daughter    Healthy Son    Anesthesia problems Neg Hx    Hypotension Neg Hx    Malignant hyperthermia Neg Hx    Pseudochol deficiency Neg Hx    Colon cancer Neg Hx     Allergies  Allergen Reactions   Ibuprofen Hives and Other (See Comments)    stuffiness   Toradol [Ketorolac Tromethamine] Hives   Aspirin Swelling   Carafate [Sucralfate] Itching   Clindamycin Hives and Itching   Food     Nuts and Yeast   Latex Hives   Lovenox [Enoxaparin Sodium] Other (See Comments)    Face flush and redness   Mitigare [Colchicine]    Nsaids Hives   Other Hives    CATS   Penicillins Hives   Tizanidine Itching   Yellow Dyes (Non-Tartrazine) Hives    YELLOW No. 5   Zanaflex [Tizanidine Hcl] Rash    Current Medications:   Current Outpatient Medications:    ALPRAZolam (XANAX) 0.5 MG tablet, Take 1 tablet (0.5 mg total) by mouth daily as needed., Disp: 30 tablet, Rfl: 2  amphetamine-dextroamphetamine (ADDERALL) 20 MG tablet, Take 1 tablet (20 mg total) by mouth 3 (three) times daily., Disp: 90 tablet, Rfl: 0   cephALEXin (KEFLEX) 500 MG capsule, Take 1 capsule (500 mg total) by mouth 4 (four) times daily., Disp: 28 capsule, Rfl: 0   cetirizine (ZYRTEC) 10 MG tablet, Take 10 mg by mouth daily., Disp: , Rfl:    levonorgestrel (MIRENA) 20 MCG/DAY IUD, 1 each by Intrauterine route once., Disp: , Rfl:    mupirocin cream (BACTROBAN) 2 %, Apply 1 Application topically 2 (two) times daily., Disp: 15 g, Rfl: 0   albuterol (VENTOLIN HFA) 108 (90 Base) MCG/ACT inhaler, Inhale 1-2 puffs into the lungs every 6 (six) hours as needed for wheezing or shortness of breath. (Patient not taking: Reported on 12/06/2023), Disp: 8.5 each, Rfl: 3   DULoxetine (CYMBALTA) 60 MG capsule, TAKE 2 CAPSULES BY MOUTH EVERY DAY, Disp: 180 capsule, Rfl: 3   ondansetron (ZOFRAN) 4 MG tablet, Take 1 tablet (4 mg total) by mouth every 8 (eight) hours as needed for nausea or  vomiting. (Patient not taking: Reported on 12/06/2023), Disp: 20 tablet, Rfl: 0   Review of Systems:   Negative unless otherwise specified per HPI.  Vitals:   Vitals:   12/06/23 1040  BP: 130/82  Pulse: 100  Temp: 98 F (36.7 C)  TempSrc: Temporal  SpO2: 98%  Weight: 159 lb 9.6 oz (72.4 kg)  Height: 5\' 6"  (1.676 m)     Body mass index is 25.76 kg/m.  Physical Exam:   Physical Exam Constitutional:      Appearance: Normal appearance. She is well-developed.  HENT:     Head: Normocephalic and atraumatic.  Eyes:     General: Lids are normal.     Extraocular Movements: Extraocular movements intact.     Conjunctiva/sclera: Conjunctivae normal.  Pulmonary:     Effort: Pulmonary effort is normal.  Musculoskeletal:        General: Normal range of motion.     Cervical back: Normal range of motion and neck supple.  Skin:    General: Skin is warm and dry.     Comments: Approximately 1 cm open lesion to left groin with scant yellow tissue; no active discharge or significant tenderness to palpation   Neurological:     Mental Status: She is alert and oriented to person, place, and time.  Psychiatric:        Attention and Perception: Attention and perception normal.        Mood and Affect: Mood normal.        Behavior: Behavior normal.        Thought Content: Thought content normal.        Judgment: Judgment normal.     Assessment and Plan:   1. Folliculitis (Primary) No red flags or symptom(s) of systemic illness Recommend topical mupirocin and cessation of hydrogen peroxide Discussed recommended wound care Pocket prescription of keflex provided if symptom(s) do not improve She has penicillin allergy -- she verbalized consent to trial keflex and knows that there may be cross reactivity Follow up if any further concerns  I, Isabelle Course, acting as a Neurosurgeon for Jarold Motto, [place]., have documented all relevant documentation on the behalf of Jarold Motto, [place], as  directed by  Jarold Motto, PA while in the presence of Jarold Motto, [place].  I, Jarold Motto, [place], have reviewed all documentation for this visit. The documentation on 12/06/23 for the exam, diagnosis, procedures, and orders are all accurate  and complete.  Jarold Motto, PA-C

## 2023-12-07 ENCOUNTER — Other Ambulatory Visit: Payer: Self-pay | Admitting: Physician Assistant

## 2023-12-07 ENCOUNTER — Encounter: Payer: Self-pay | Admitting: Physician Assistant

## 2023-12-07 DIAGNOSIS — R110 Nausea: Secondary | ICD-10-CM

## 2023-12-07 MED ORDER — FLUCONAZOLE 150 MG PO TABS: ORAL_TABLET | ORAL | 0 refills | Status: DC

## 2023-12-07 MED ORDER — ONDANSETRON HCL 4 MG PO TABS: 4.0000 mg | ORAL_TABLET | Freq: Three times a day (TID) | ORAL | 0 refills | Status: DC | PRN

## 2023-12-07 NOTE — Telephone Encounter (Signed)
 Duplicate

## 2023-12-07 NOTE — Telephone Encounter (Signed)
 Pt called again about message below. Please advise.

## 2024-01-04 ENCOUNTER — Other Ambulatory Visit: Payer: Self-pay | Admitting: Physician Assistant

## 2024-01-04 DIAGNOSIS — R110 Nausea: Secondary | ICD-10-CM

## 2024-01-04 NOTE — Telephone Encounter (Signed)
 Last OV: 12/06/23  Next OV: none  Last Filled: 12/07/23  Quantity: 20  Pt requesting refill for Zofran , please advise

## 2024-04-22 ENCOUNTER — Other Ambulatory Visit (HOSPITAL_COMMUNITY): Payer: Self-pay

## 2024-04-22 MED ORDER — AMPHETAMINE-DEXTROAMPHETAMINE 20 MG PO TABS
20.0000 mg | ORAL_TABLET | Freq: Three times a day (TID) | ORAL | 0 refills | Status: DC
  Filled 2024-04-22 – 2024-04-23 (×2): qty 90, 30d supply, fill #0

## 2024-04-23 ENCOUNTER — Other Ambulatory Visit (HOSPITAL_COMMUNITY): Payer: Self-pay

## 2024-05-22 ENCOUNTER — Other Ambulatory Visit (HOSPITAL_COMMUNITY): Payer: Self-pay

## 2024-05-22 MED ORDER — AMPHETAMINE-DEXTROAMPHETAMINE 20 MG PO TABS
20.0000 mg | ORAL_TABLET | Freq: Three times a day (TID) | ORAL | 0 refills | Status: DC
  Filled 2024-05-23: qty 90, 30d supply, fill #0

## 2024-05-23 ENCOUNTER — Other Ambulatory Visit (HOSPITAL_COMMUNITY): Payer: Self-pay

## 2024-06-23 ENCOUNTER — Other Ambulatory Visit: Payer: Self-pay | Admitting: Physician Assistant

## 2024-09-17 ENCOUNTER — Encounter: Payer: Self-pay | Admitting: Physician Assistant

## 2024-09-17 ENCOUNTER — Other Ambulatory Visit: Payer: Self-pay | Admitting: Physician Assistant

## 2024-09-17 MED ORDER — DULOXETINE HCL 60 MG PO CPEP: 120.0000 mg | ORAL_CAPSULE | Freq: Every day | ORAL | 0 refills | Status: DC

## 2024-09-30 ENCOUNTER — Encounter: Payer: Self-pay | Admitting: Physician Assistant

## 2024-09-30 ENCOUNTER — Ambulatory Visit: Admitting: Physician Assistant

## 2024-09-30 VITALS — BP 118/80 | HR 81 | Temp 97.5°F | Ht 65.0 in | Wt 159.0 lb

## 2024-09-30 DIAGNOSIS — Z720 Tobacco use: Secondary | ICD-10-CM | POA: Diagnosis not present

## 2024-09-30 DIAGNOSIS — E785 Hyperlipidemia, unspecified: Secondary | ICD-10-CM

## 2024-09-30 DIAGNOSIS — F909 Attention-deficit hyperactivity disorder, unspecified type: Secondary | ICD-10-CM

## 2024-09-30 DIAGNOSIS — L659 Nonscarring hair loss, unspecified: Secondary | ICD-10-CM | POA: Diagnosis not present

## 2024-09-30 DIAGNOSIS — F5101 Primary insomnia: Secondary | ICD-10-CM | POA: Diagnosis not present

## 2024-09-30 DIAGNOSIS — F32A Depression, unspecified: Secondary | ICD-10-CM | POA: Diagnosis not present

## 2024-09-30 DIAGNOSIS — R110 Nausea: Secondary | ICD-10-CM

## 2024-09-30 DIAGNOSIS — Z0000 Encounter for general adult medical examination without abnormal findings: Secondary | ICD-10-CM

## 2024-09-30 DIAGNOSIS — F419 Anxiety disorder, unspecified: Secondary | ICD-10-CM

## 2024-09-30 DIAGNOSIS — M7918 Myalgia, other site: Secondary | ICD-10-CM

## 2024-09-30 LAB — LIPID PANEL
Cholesterol: 191 mg/dL (ref 28–200)
HDL: 50.7 mg/dL
LDL Cholesterol: 125 mg/dL — ABNORMAL HIGH (ref 10–99)
NonHDL: 140.75
Total CHOL/HDL Ratio: 4
Triglycerides: 78 mg/dL (ref 10.0–149.0)
VLDL: 15.6 mg/dL (ref 0.0–40.0)

## 2024-09-30 LAB — COMPREHENSIVE METABOLIC PANEL WITH GFR
ALT: 12 U/L (ref 3–35)
AST: 17 U/L (ref 5–37)
Albumin: 4.1 g/dL (ref 3.5–5.2)
Alkaline Phosphatase: 45 U/L (ref 39–117)
BUN: 8 mg/dL (ref 6–23)
CO2: 27 meq/L (ref 19–32)
Calcium: 8.7 mg/dL (ref 8.4–10.5)
Chloride: 104 meq/L (ref 96–112)
Creatinine, Ser: 0.64 mg/dL (ref 0.40–1.20)
GFR: 108.45 mL/min
Glucose, Bld: 92 mg/dL (ref 70–99)
Potassium: 3.7 meq/L (ref 3.5–5.1)
Sodium: 139 meq/L (ref 135–145)
Total Bilirubin: 0.3 mg/dL (ref 0.2–1.2)
Total Protein: 6.6 g/dL (ref 6.0–8.3)

## 2024-09-30 LAB — TSH: TSH: 1.48 u[IU]/mL (ref 0.35–5.50)

## 2024-09-30 LAB — CBC WITH DIFFERENTIAL/PLATELET
Basophils Absolute: 0 10*3/uL (ref 0.0–0.1)
Basophils Relative: 0.3 % (ref 0.0–3.0)
Eosinophils Absolute: 0.3 10*3/uL (ref 0.0–0.7)
Eosinophils Relative: 4.7 % (ref 0.0–5.0)
HCT: 40 % (ref 36.0–46.0)
Hemoglobin: 13.8 g/dL (ref 12.0–15.0)
Lymphocytes Relative: 23.5 % (ref 12.0–46.0)
Lymphs Abs: 1.3 10*3/uL (ref 0.7–4.0)
MCHC: 34.4 g/dL (ref 30.0–36.0)
MCV: 90.1 fl (ref 78.0–100.0)
Monocytes Absolute: 0.4 10*3/uL (ref 0.1–1.0)
Monocytes Relative: 6.4 % (ref 3.0–12.0)
Neutro Abs: 3.6 10*3/uL (ref 1.4–7.7)
Neutrophils Relative %: 65.1 % (ref 43.0–77.0)
Platelets: 239 10*3/uL (ref 150.0–400.0)
RBC: 4.44 Mil/uL (ref 3.87–5.11)
RDW: 13.4 % (ref 11.5–15.5)
WBC: 5.6 10*3/uL (ref 4.0–10.5)

## 2024-09-30 LAB — IBC + FERRITIN
Ferritin: 18.7 ng/mL (ref 10.0–291.0)
Iron: 58 ug/dL (ref 42–145)
Saturation Ratios: 18.7 % — ABNORMAL LOW (ref 20.0–50.0)
TIBC: 310.8 ug/dL (ref 250.0–450.0)
Transferrin: 222 mg/dL (ref 212.0–360.0)

## 2024-09-30 MED ORDER — ONDANSETRON HCL 4 MG PO TABS: 4.0000 mg | ORAL_TABLET | Freq: Three times a day (TID) | ORAL | 1 refills | Status: AC | PRN

## 2024-09-30 MED ORDER — DULOXETINE HCL 60 MG PO CPEP: 120.0000 mg | ORAL_CAPSULE | Freq: Every day | ORAL | 3 refills | Status: AC

## 2024-10-01 ENCOUNTER — Ambulatory Visit: Payer: Self-pay | Admitting: Physician Assistant
